# Patient Record
Sex: Female | Born: 1955
Health system: Southern US, Community
[De-identification: ages and names within clinical notes are randomized; demographics above are authoritative.]

## PROBLEM LIST (undated history)

## (undated) DIAGNOSIS — M1990 Unspecified osteoarthritis, unspecified site: Secondary | ICD-10-CM

## (undated) DIAGNOSIS — I82409 Acute embolism and thrombosis of unspecified deep veins of unspecified lower extremity: Secondary | ICD-10-CM

## (undated) DIAGNOSIS — C801 Malignant (primary) neoplasm, unspecified: Secondary | ICD-10-CM

## (undated) DIAGNOSIS — J45909 Unspecified asthma, uncomplicated: Secondary | ICD-10-CM

## (undated) DIAGNOSIS — R0600 Dyspnea, unspecified: Secondary | ICD-10-CM

## (undated) DIAGNOSIS — I639 Cerebral infarction, unspecified: Secondary | ICD-10-CM

## (undated) DIAGNOSIS — K219 Gastro-esophageal reflux disease without esophagitis: Secondary | ICD-10-CM

## (undated) DIAGNOSIS — F419 Anxiety disorder, unspecified: Secondary | ICD-10-CM

## (undated) DIAGNOSIS — G709 Myoneural disorder, unspecified: Secondary | ICD-10-CM

## (undated) HISTORY — PX: THYROID CYST EXCISION: SHX2511

---

## 2017-03-06 ENCOUNTER — Encounter (HOSPITAL_COMMUNITY): Payer: Self-pay

## 2017-03-06 ENCOUNTER — Ambulatory Visit (HOSPITAL_COMMUNITY)
Admission: RE | Admit: 2017-03-06 | Discharge: 2017-03-06 | Disposition: A | Discharge status: Home / Self Care | Payer: Federal, State, Local not specified - PPO | Source: Ambulatory Visit | Attending: Cardiology | Admitting: Cardiology

## 2017-03-06 ENCOUNTER — Other Ambulatory Visit (HOSPITAL_COMMUNITY): Payer: Self-pay | Admitting: Orthopedic Surgery

## 2017-03-06 DIAGNOSIS — M160 Bilateral primary osteoarthritis of hip: Secondary | ICD-10-CM | POA: Diagnosis present

## 2017-03-06 DIAGNOSIS — R52 Pain, unspecified: Secondary | ICD-10-CM | POA: Diagnosis not present

## 2017-03-06 DIAGNOSIS — I82412 Acute embolism and thrombosis of left femoral vein: Secondary | ICD-10-CM | POA: Insufficient documentation

## 2017-03-06 DIAGNOSIS — I82442 Acute embolism and thrombosis of left tibial vein: Secondary | ICD-10-CM

## 2017-03-06 DIAGNOSIS — I82409 Acute embolism and thrombosis of unspecified deep veins of unspecified lower extremity: Secondary | ICD-10-CM | POA: Diagnosis not present

## 2017-03-16 NOTE — H&P (Signed)
TOTAL HIP ADMISSION H&P  Patient is admitted for left total hip arthroplasty, anterior approach.  Subjective:  Chief Complaint:    Left hip AVN / pain  HPI: [name], 61 y.o. female, has a history of pain and functional disability in the left hip(s) due to arthritis and patient has failed non-surgical conservative treatments for greater than 12 weeks to include NSAID's and/or analgesics, use of assistive devices and activity modification.  Onset of symptoms was gradual starting 1+ years ago with gradually worsening course since that time.The patient noted no past surgery on the left hip(s).  Patient currently rates pain in the left hip at 10 out of 10 with activity. Patient has worsening of pain with activity and weight bearing, trendelenberg gait, pain that interfers with activities of daily living and pain with passive range of motion. Patient has evidence of periarticular osteophytes, joint space narrowing and AVN by imaging studies. This condition presents safety issues increasing the risk of falls.   There is no current active infection.   Risks, benefits and expectations were discussed with the patient.  Risks including but not limited to the risk of anesthesia, blood clots, nerve damage, blood vessel damage, failure of the prosthesis, infection and up to and including death.  Patient understand the risks, benefits and expectations and wishes to proceed with surgery.   PCP: Ladona Horns, MD  D/C Plans:       Home  Post-op Meds:       No Rx given  Tranexamic Acid:      To be given -  Topically (previous DVT)  Decadron:      Is to be given  FYI:     Xarelto  Norco  PT:  No PT   Past Medical History:  Diagnosis Date  . Anxiety   . Arthritis   . Asthma    childhood   . Cancer [facility])    colorectal cancer with chemo and radiation   . DVT (deep venous thrombosis) ([place])   . Dyspnea    with anxiety and exertion  . Neuromuscular disorder (HCC)    poly neuropathy   .  Stroke [facility]) 2010, 2011   TIA  x2    Past Surgical History:  Procedure Laterality Date  . CESAREAN SECTION    . THYROID CYST EXCISION      No prescriptions prior to admission.   Allergies  Allergen Reactions  . Penicillins     Has patient had a PCN reaction causing immediate rash, facial/tongue/throat swelling, SOB or lightheadedness with hypotension: Unknown Has patient had a PCN reaction causing severe rash involving mucus membranes or skin necrosis: Unknown Has patient had a PCN reaction that required hospitalization Unknown Has patient had a PCN reaction occurring within the last 10 years: Unknown Daughter states pt cant take any derivative of penicillin either    Social History  Substance Use Topics  . Smoking status: Former Smoker    Types: Cigarettes  . Smokeless tobacco: Never Used     Comment: 20 years ago   . Alcohol use No       Review of Systems  Constitutional: Negative.   HENT: Negative.   Eyes: Negative.   Respiratory: Negative.   Cardiovascular: Negative.   Gastrointestinal: Positive for constipation.  Genitourinary: Negative.   Musculoskeletal: Positive for joint pain.  Skin: Negative.   Neurological: Negative.   Endo/Heme/Allergies: Negative.   Psychiatric/Behavioral: The patient is nervous/anxious.     Objective:  Physical Exam  Constitutional: She  is oriented to person, place, and time. She appears well-developed.  HENT:  Head: Normocephalic.  Eyes: Pupils are equal, round, and reactive to light.  Neck: Neck supple. No JVD present. No tracheal deviation present. No thyromegaly present.  Cardiovascular: Normal rate, regular rhythm and intact distal pulses.   Respiratory: Effort normal and breath sounds normal. No respiratory distress. She has no wheezes.  GI: Soft. There is no tenderness. There is no guarding.  Musculoskeletal:       Left hip: She exhibits decreased range of motion, decreased strength, tenderness and bony tenderness. She  exhibits no swelling, no deformity and no laceration.  Lymphadenopathy:    She has no cervical adenopathy.  Neurological: She is alert and oriented to person, place, and time. A sensory deficit (polyneuropathy bilateral LEs) is present.  Skin: Skin is warm and dry.  Psychiatric: She has a normal mood and affect.       Imaging Review Plain radiographs demonstrate severe degenerative joint disease of the left hip(s). The bone quality appears to be good for age and reported activity level.  Assessment/Plan:  End stage arthritis, left hip(s)  The patient history, physical examination, clinical judgement of the provider and imaging studies are consistent with end stage degenerative joint disease of the left hip(s) and total hip arthroplasty is deemed medically necessary. The treatment options including medical management, injection therapy, arthroscopy and arthroplasty were discussed at length. The risks and benefits of total hip arthroplasty were presented and reviewed. The risks due to aseptic loosening, infection, stiffness, dislocation/subluxation,  thromboembolic complications and other imponderables were discussed.  The patient acknowledged the explanation, agreed to proceed with the plan and consent was signed. Patient is being admitted for inpatient treatment for surgery, pain control, PT, OT, prophylactic antibiotics, VTE prophylaxis, progressive ambulation and ADL's and discharge planning.The patient is planning to be discharged home.     [place] Analeah Brame   PA-C  03/21/2017, 3:48 AM

## 2017-03-17 ENCOUNTER — Other Ambulatory Visit (HOSPITAL_COMMUNITY): Payer: Self-pay | Admitting: Emergency Medicine

## 2017-03-17 NOTE — Patient Instructions (Addendum)
Hennie Smith-Thomas  03/17/2017   Your procedure is scheduled on: 03-25-17  Report to [place]  Entrance take [facility]  elevators to 3rd floor to  [place] at [phone].  Call this number if you have problems the morning of surgery [phone]   Remember: ONLY 1 PERSON MAY GO WITH YOU TO SHORT STAY TO GET  READY MORNING OF [place].  Do not eat food or drink liquids :After Midnight.     Take these medicines the morning of surgery with A SIP OF WATER: tramadol as needed                                 You may not have any metal on your body including hair pins and              piercings  Do not wear jewelry, make-up, lotions, powders or perfumes, deodorant             Do not wear nail polish.  Do not shave  48 hours prior to surgery.              Men may shave face and neck.   Do not bring valuables to the hospital. [place].  Contacts, dentures or bridgework may not be worn into surgery.  Leave suitcase in the car. After surgery it may be brought to your room.              Please read over the following fact sheets you were given: _____________________________________________________________________             [facility] - Preparing for Surgery Before surgery, you can play an important role.  Because skin is not sterile, your skin needs to be as free of germs as possible.  You can reduce the number of germs on your skin by washing with CHG (chlorahexidine gluconate) soap before surgery.  CHG is an antiseptic cleaner which kills germs and bonds with the skin to continue killing germs even after washing. Please DO NOT use if you have an allergy to CHG or antibacterial soaps.  If your skin becomes reddened/irritated stop using the CHG and inform your nurse when you arrive at Short Stay. Do not shave (including legs and underarms) for at least 48 hours prior to the first CHG shower.  You may shave  your face/neck. Please follow these instructions carefully:  1.  Shower with CHG Soap the night before surgery and the  morning of Surgery.  2.  If you choose to wash your hair, wash your hair first as usual with your  normal  shampoo.  3.  After you shampoo, rinse your hair and body thoroughly to remove the  shampoo.                           4.  Use CHG as you would any other liquid soap.  You can apply chg directly  to the skin and wash                       Gently with a scrungie or clean washcloth.  5.  Apply the CHG Soap to your body  ONLY FROM THE NECK DOWN.   Do not use on face/ open                           Wound or open sores. Avoid contact with eyes, ears mouth and genitals (private parts).                       Wash face,  Genitals (private parts) with your normal soap.             6.  Wash thoroughly, paying special attention to the area where your surgery  will be performed.  7.  Thoroughly rinse your body with warm water from the neck down.  8.  DO NOT shower/wash with your normal soap after using and rinsing off  the CHG Soap.                9.  Pat yourself dry with a clean towel.            10.  Wear clean pajamas.            11.  Place clean sheets on your bed the night of your first shower and do not  sleep with pets. Day of Surgery : Do not apply any lotions/deodorants the morning of surgery.  Please wear clean clothes to the hospital/surgery center.  FAILURE TO FOLLOW THESE INSTRUCTIONS MAY RESULT IN THE CANCELLATION OF YOUR SURGERY PATIENT SIGNATURE_________________________________  NURSE SIGNATURE__________________________________  ________________________________________________________________________   Adam Phenix  An incentive spirometer is a tool that can help keep your lungs clear and active. This tool measures how well you are filling your lungs with each breath. Taking long deep breaths may help reverse or decrease the chance of developing  breathing (pulmonary) problems (especially infection) following:  A long period of time when you are unable to move or be active. BEFORE THE PROCEDURE   If the spirometer includes an indicator to show your best effort, your nurse or respiratory therapist will set it to a desired goal.  If possible, sit up straight or lean slightly forward. Try not to slouch.  Hold the incentive spirometer in an upright position. INSTRUCTIONS FOR USE  1. Sit on the edge of your bed if possible, or sit up as far as you can in bed or on a chair. 2. Hold the incentive spirometer in an upright position. 3. Breathe out normally. 4. Place the mouthpiece in your mouth and seal your lips tightly around it. 5. Breathe in slowly and as deeply as possible, raising the piston or the ball toward the top of the column. 6. Hold your breath for 3-5 seconds or for as long as possible. Allow the piston or ball to fall to the bottom of the column. 7. Remove the mouthpiece from your mouth and breathe out normally. 8. Rest for a few seconds and repeat Steps 1 through 7 at least 10 times every 1-2 hours when you are awake. Take your time and take a few normal breaths between deep breaths. 9. The spirometer may include an indicator to show your best effort. Use the indicator as a goal to work toward during each repetition. 10. After each set of 10 deep breaths, practice coughing to be sure your lungs are clear. If you have an incision (the cut made at the time of surgery), support your incision when coughing by placing a pillow or rolled up towels firmly against it. Once  you are able to get out of bed, walk around indoors and cough well. You may stop using the incentive spirometer when instructed by your caregiver.  RISKS AND COMPLICATIONS  Take your time so you do not get dizzy or light-headed.  If you are in pain, you may need to take or ask for pain medication before doing incentive spirometry. It is harder to take a deep  breath if you are having pain. AFTER USE  Rest and breathe slowly and easily.  It can be helpful to keep track of a log of your progress. Your caregiver can provide you with a simple table to help with this. If you are using the spirometer at home, follow these instructions: [place] IF:   You are having difficultly using the spirometer.  You have trouble using the spirometer as often as instructed.  Your pain medication is not giving enough relief while using the spirometer.  You develop fever of 100.5 F (38.1 C) or higher. SEEK IMMEDIATE MEDICAL CARE IF:   You cough up bloody sputum that had not been present before.  You develop fever of 102 F (38.9 C) or greater.  You develop worsening pain at or near the incision site. MAKE SURE YOU:   Understand these instructions.  Will watch your condition.  Will get help right away if you are not doing well or get worse. Document Released: 04/21/2007 Document Revised: 03/02/2012 Document Reviewed: 06/22/2007 Exit[employer] Patient Information 2014 Exit[employer], [place].   ________________________________________________________________________  WHAT IS A BLOOD TRANSFUSION? Blood Transfusion Information  A transfusion is the replacement of blood or some of its parts. Blood is made up of multiple cells which provide different functions.  Red blood cells carry oxygen and are used for blood loss replacement.  White blood cells fight against infection.  Platelets control bleeding.  Plasma helps clot blood.  Other blood products are available for specialized needs, such as hemophilia or other clotting disorders. BEFORE THE TRANSFUSION  Who gives blood for transfusions?   Healthy volunteers who are fully evaluated to make sure their blood is safe. This is blood bank blood. Transfusion therapy is the safest it has ever been in the practice of medicine. Before blood is taken from a donor, a complete history is taken to make sure  that person has no history of diseases nor engages in risky social behavior (examples are intravenous drug use or sexual activity with multiple partners). The donor's travel history is screened to minimize risk of transmitting infections, such as malaria. The donated blood is tested for signs of infectious diseases, such as HIV and hepatitis. The blood is then tested to be sure it is compatible with you in order to minimize the chance of a transfusion reaction. If you or a relative donates blood, this is often done in anticipation of surgery and is not appropriate for emergency situations. It takes many days to process the donated blood. RISKS AND COMPLICATIONS Although transfusion therapy is very safe and saves many lives, the main dangers of transfusion include:   Getting an infectious disease.  Developing a transfusion reaction. This is an allergic reaction to something in the blood you were given. Every precaution is taken to prevent this. The decision to have a blood transfusion has been considered carefully by your caregiver before blood is given. Blood is not given unless the benefits outweigh the risks. AFTER THE TRANSFUSION  Right after receiving a blood transfusion, you will usually feel much better and more energetic. This  is especially true if your red blood cells have gotten low (anemic). The transfusion raises the level of the red blood cells which carry oxygen, and this usually causes an energy increase.  The nurse administering the transfusion will monitor you carefully for complications. HOME CARE INSTRUCTIONS  No special instructions are needed after a transfusion. You may find your energy is better. Speak with your caregiver about any limitations on activity for underlying diseases you may have. SEEK MEDICAL CARE IF:   Your condition is not improving after your transfusion.  You develop redness or irritation at the intravenous (IV) site. SEEK IMMEDIATE MEDICAL CARE IF:  Any of  the following symptoms occur over the next 12 hours:  Shaking chills.  You have a temperature by mouth above 102 F (38.9 C), not controlled by medicine.  Chest, back, or muscle pain.  People around you feel you are not acting correctly or are confused.  Shortness of breath or difficulty breathing.  Dizziness and fainting.  You get a rash or develop hives.  You have a decrease in urine output.  Your urine turns a dark color or changes to pink, red, or brown. Any of the following symptoms occur over the next 10 days:  You have a temperature by mouth above 102 F (38.9 C), not controlled by medicine.  Shortness of breath.  Weakness after normal activity.  The white part of the eye turns yellow (jaundice).  You have a decrease in the amount of urine or are urinating less often.  Your urine turns a dark color or changes to pink, red, or brown. Document Released: 12/06/2000 Document Revised: 03/02/2012 Document Reviewed: 07/25/2008 [facility] Patient Information 2014 [place], [place].  _______________________________________________________________________

## 2017-03-18 ENCOUNTER — Encounter (HOSPITAL_COMMUNITY)
Admission: RE | Admit: 2017-03-18 | Discharge: 2017-03-18 | Disposition: A | Discharge status: Home / Self Care | Payer: Federal, State, Local not specified - PPO | Source: Ambulatory Visit | Attending: Orthopedic Surgery | Admitting: Orthopedic Surgery

## 2017-03-18 ENCOUNTER — Encounter (HOSPITAL_COMMUNITY): Payer: Self-pay | Admitting: Emergency Medicine

## 2017-03-18 DIAGNOSIS — M25552 Pain in left hip: Secondary | ICD-10-CM | POA: Diagnosis not present

## 2017-03-18 DIAGNOSIS — Z01812 Encounter for preprocedural laboratory examination: Secondary | ICD-10-CM | POA: Insufficient documentation

## 2017-03-18 HISTORY — DX: Myoneural disorder, unspecified: G70.9

## 2017-03-18 HISTORY — DX: Unspecified asthma, uncomplicated: J45.909

## 2017-03-18 HISTORY — DX: Dyspnea, unspecified: R06.00

## 2017-03-18 HISTORY — DX: Malignant (primary) neoplasm, unspecified: C80.1

## 2017-03-18 HISTORY — DX: Unspecified osteoarthritis, unspecified site: M19.90

## 2017-03-18 HISTORY — DX: Anxiety disorder, unspecified: F41.9

## 2017-03-18 HISTORY — DX: Cerebral infarction, unspecified: I63.9

## 2017-03-18 HISTORY — DX: Acute embolism and thrombosis of unspecified deep veins of unspecified lower extremity: I82.409

## 2017-03-18 LAB — SURGICAL PCR SCREEN
MRSA, PCR: NEGATIVE
STAPHYLOCOCCUS AUREUS: NEGATIVE

## 2017-03-18 LAB — ABO/RH: ABO/RH(D): O POS

## 2017-03-18 LAB — CBC
HEMATOCRIT: 37.2 % (ref 36.0–46.0)
Hemoglobin: 12.3 g/dL (ref 12.0–15.0)
MCH: 25.6 pg — AB (ref 26.0–34.0)
MCHC: 33.1 g/dL (ref 30.0–36.0)
MCV: 77.3 fL — AB (ref 78.0–100.0)
PLATELETS: 476 10*3/uL — AB (ref 150–400)
RBC: 4.81 MIL/uL (ref 3.87–5.11)
RDW: 16.6 % — AB (ref 11.5–15.5)
WBC: 9.6 10*3/uL (ref 4.0–10.5)

## 2017-03-18 LAB — BASIC METABOLIC PANEL
Anion gap: 4 — ABNORMAL LOW (ref 5–15)
BUN: 8 mg/dL (ref 6–20)
CO2: 29 mmol/L (ref 22–32)
CREATININE: 0.55 mg/dL (ref 0.44–1.00)
Calcium: 9.3 mg/dL (ref 8.9–10.3)
Chloride: 106 mmol/L (ref 101–111)
GFR calc Af Amer: >60 mL/min (ref >60)
GFR calc non Af Amer: >60 mL/min (ref >60)
Glucose, Bld: 90 mg/dL (ref 65–99)
POTASSIUM: 4 mmol/L (ref 3.5–5.1)
Sodium: 139 mmol/L (ref 135–145)

## 2017-03-19 NOTE — Progress Notes (Signed)
requested EKG from Dr Inda Castle at [facility]

## 2017-03-20 NOTE — Progress Notes (Signed)
EKG 03-11-17 on chart

## 2017-03-24 NOTE — Anesthesia Preprocedure Evaluation (Addendum)
Anesthesia Evaluation  Patient identified by MRN, date of birth, ID band Patient awake    Reviewed: Allergy & Precautions, NPO status , Patient's Chart, lab work & pertinent test results  History of Anesthesia Complications Negative for: history of anesthetic complications  Airway Mallampati: III  TM Distance: >3 FB Neck ROM: Full    Dental  (+) Dental Advisory Given   Pulmonary asthma , former smoker,    Pulmonary exam normal        Cardiovascular negative cardio ROS Normal cardiovascular exam     Neuro/Psych Anxiety CVA    GI/Hepatic negative GI ROS, Neg liver ROS,   Endo/Other  negative endocrine ROS  Renal/GU negative Renal ROS     Musculoskeletal negative musculoskeletal ROS (+)   Abdominal   Peds  Hematology negative hematology ROS (+)   Anesthesia Other Findings Day of surgery medications reviewed with the patient.  Reproductive/Obstetrics                            Anesthesia Physical Anesthesia Plan  ASA: III  Anesthesia Plan: General   Post-op Pain Management:    Induction: Intravenous  Airway Management Planned: Oral ETT  Additional Equipment:   Intra-op Plan:   Post-operative Plan: Extubation in OR  Informed Consent: I have reviewed the patients History and Physical, chart, labs and discussed the procedure including the risks, benefits and alternatives for the proposed anesthesia with the patient or authorized representative who has indicated his/her understanding and acceptance.   Dental advisory given  Plan Discussed with: CRNA, Anesthesiologist and Surgeon  Anesthesia Plan Comments: (Pt refuses SAB)       Anesthesia Quick Evaluation

## 2017-03-25 ENCOUNTER — Encounter (HOSPITAL_COMMUNITY)
Admission: RE | Disposition: A | Discharge status: Home Health | Payer: Self-pay | Source: Ambulatory Visit | Attending: Orthopedic Surgery

## 2017-03-25 ENCOUNTER — Inpatient Hospital Stay (HOSPITAL_COMMUNITY): Payer: Federal, State, Local not specified - PPO | Admitting: Anesthesiology

## 2017-03-25 ENCOUNTER — Encounter (HOSPITAL_COMMUNITY): Payer: Self-pay | Admitting: *Deleted

## 2017-03-25 ENCOUNTER — Inpatient Hospital Stay (HOSPITAL_COMMUNITY): Payer: Federal, State, Local not specified - PPO

## 2017-03-25 ENCOUNTER — Inpatient Hospital Stay (HOSPITAL_COMMUNITY)
Admission: RE | Admit: 2017-03-25 | Discharge: 2017-03-28 | DRG: 470 | Disposition: A | Discharge status: Home Health | Payer: Federal, State, Local not specified - PPO | Source: Ambulatory Visit | Attending: Orthopedic Surgery | Admitting: Orthopedic Surgery

## 2017-03-25 DIAGNOSIS — Z87891 Personal history of nicotine dependence: Secondary | ICD-10-CM | POA: Diagnosis not present

## 2017-03-25 DIAGNOSIS — M25552 Pain in left hip: Secondary | ICD-10-CM

## 2017-03-25 DIAGNOSIS — M879 Osteonecrosis, unspecified: Secondary | ICD-10-CM | POA: Diagnosis present

## 2017-03-25 DIAGNOSIS — G629 Polyneuropathy, unspecified: Secondary | ICD-10-CM | POA: Diagnosis present

## 2017-03-25 DIAGNOSIS — K5900 Constipation, unspecified: Secondary | ICD-10-CM | POA: Diagnosis not present

## 2017-03-25 DIAGNOSIS — Z86718 Personal history of other venous thrombosis and embolism: Secondary | ICD-10-CM | POA: Diagnosis not present

## 2017-03-25 DIAGNOSIS — F419 Anxiety disorder, unspecified: Secondary | ICD-10-CM | POA: Diagnosis present

## 2017-03-25 DIAGNOSIS — Z880 Allergy status to penicillin: Secondary | ICD-10-CM | POA: Diagnosis not present

## 2017-03-25 DIAGNOSIS — Z85048 Personal history of other malignant neoplasm of rectum, rectosigmoid junction, and anus: Secondary | ICD-10-CM

## 2017-03-25 DIAGNOSIS — J45909 Unspecified asthma, uncomplicated: Secondary | ICD-10-CM | POA: Diagnosis present

## 2017-03-25 DIAGNOSIS — M1612 Unilateral primary osteoarthritis, left hip: Principal | ICD-10-CM | POA: Diagnosis present

## 2017-03-25 DIAGNOSIS — Z8673 Personal history of transient ischemic attack (TIA), and cerebral infarction without residual deficits: Secondary | ICD-10-CM

## 2017-03-25 DIAGNOSIS — Z96649 Presence of unspecified artificial hip joint: Secondary | ICD-10-CM

## 2017-03-25 DIAGNOSIS — Z96642 Presence of left artificial hip joint: Secondary | ICD-10-CM

## 2017-03-25 HISTORY — PX: TOTAL HIP ARTHROPLASTY: SHX124

## 2017-03-25 SURGERY — ARTHROPLASTY, HIP, TOTAL, ANTERIOR APPROACH
Anesthesia: General | Site: Hip | Laterality: Left

## 2017-03-25 MED ORDER — SODIUM CHLORIDE 0.9 % IJ SOLN
INTRAMUSCULAR | Status: AC
  Filled 2017-03-25: qty 10

## 2017-03-25 MED ORDER — LIDOCAINE 2% (20 MG/ML) 5 ML SYRINGE
INTRAMUSCULAR | Status: DC | PRN
  Administered 2017-03-25: 100 mg via INTRAVENOUS

## 2017-03-25 MED ORDER — HYDROMORPHONE HCL 1 MG/ML IJ SOLN
INTRAMUSCULAR | Status: DC | PRN
  Administered 2017-03-25 (×4): .4 mg via INTRAVENOUS

## 2017-03-25 MED ORDER — ONDANSETRON HCL 4 MG/2ML IJ SOLN
INTRAMUSCULAR | Status: DC | PRN
  Administered 2017-03-25: 4 mg via INTRAVENOUS

## 2017-03-25 MED ORDER — METHOCARBAMOL 1000 MG/10ML IJ SOLN
500.0000 mg | Freq: Four times a day (QID) | INTRAMUSCULAR | Status: DC | PRN
  Administered 2017-03-25: 500 mg via INTRAVENOUS
  Filled 2017-03-25: qty 5
  Filled 2017-03-25: qty 550

## 2017-03-25 MED ORDER — FENTANYL CITRATE (PF) 100 MCG/2ML IJ SOLN
INTRAMUSCULAR | Status: AC
  Filled 2017-03-25: qty 2

## 2017-03-25 MED ORDER — METHOCARBAMOL 500 MG PO TABS: 500.0000 mg | ORAL_TABLET | Freq: Four times a day (QID) | ORAL | 0 refills | Status: AC | PRN

## 2017-03-25 MED ORDER — LABETALOL HCL 5 MG/ML IV SOLN
INTRAVENOUS | Status: AC
  Filled 2017-03-25: qty 4

## 2017-03-25 MED ORDER — POLYETHYLENE GLYCOL 3350 17 G PO PACK: 17.0000 g | PACK | Freq: Two times a day (BID) | ORAL | 0 refills | Status: AC

## 2017-03-25 MED ORDER — MIDAZOLAM HCL 2 MG/2ML IJ SOLN: 0.5000 mg | Freq: Once | INTRAMUSCULAR | Status: DC

## 2017-03-25 MED ORDER — CELECOXIB 200 MG PO CAPS
200.0000 mg | ORAL_CAPSULE | Freq: Two times a day (BID) | ORAL | Status: DC
  Administered 2017-03-25 – 2017-03-28 (×6): 200 mg via ORAL
  Filled 2017-03-25 (×6): qty 1

## 2017-03-25 MED ORDER — ONDANSETRON HCL 4 MG PO TABS: 4.0000 mg | ORAL_TABLET | Freq: Four times a day (QID) | ORAL | Status: DC | PRN

## 2017-03-25 MED ORDER — ONDANSETRON HCL 4 MG/2ML IJ SOLN
INTRAMUSCULAR | Status: AC
Start: 2017-03-25 — End: 2017-03-25
  Filled 2017-03-25: qty 2

## 2017-03-25 MED ORDER — HYDROMORPHONE HCL 1 MG/ML IJ SOLN
0.2500 mg | INTRAMUSCULAR | Status: DC | PRN
  Administered 2017-03-25: 0.5 mg via INTRAVENOUS

## 2017-03-25 MED ORDER — TRANEXAMIC ACID 1000 MG/10ML IV SOLN
INTRAVENOUS | Status: DC | PRN
  Administered 2017-03-25: 2000 mg via TOPICAL

## 2017-03-25 MED ORDER — SODIUM CHLORIDE 0.9 % IV SOLN
100.0000 mL/h | INTRAVENOUS | Status: DC
  Administered 2017-03-25 – 2017-03-26 (×2): 100 mL/h via INTRAVENOUS
  Filled 2017-03-25 (×10): qty 1000

## 2017-03-25 MED ORDER — KETAMINE HCL 10 MG/ML IJ SOLN
INTRAMUSCULAR | Status: DC | PRN
  Administered 2017-03-25: 10 mg via INTRAVENOUS
  Administered 2017-03-25: 20 mg via INTRAVENOUS
  Administered 2017-03-25 (×2): 10 mg via INTRAVENOUS

## 2017-03-25 MED ORDER — ACETAMINOPHEN 10 MG/ML IV SOLN: INTRAVENOUS | Status: DC | PRN

## 2017-03-25 MED ORDER — METOCLOPRAMIDE HCL 5 MG/ML IJ SOLN
5.0000 mg | Freq: Three times a day (TID) | INTRAMUSCULAR | Status: DC | PRN
  Filled 2017-03-25: qty 2

## 2017-03-25 MED ORDER — NON FORMULARY: 20.0000 mg | Freq: Every day | Status: DC

## 2017-03-25 MED ORDER — BISACODYL 10 MG RE SUPP: 10.0000 mg | Freq: Every day | RECTAL | Status: DC | PRN

## 2017-03-25 MED ORDER — 0.9 % SODIUM CHLORIDE (POUR BTL) OPTIME
TOPICAL | Status: DC | PRN
  Administered 2017-03-25: 1000 mL

## 2017-03-25 MED ORDER — PHENOL 1.4 % MT LIQD
1.0000 | OROMUCOSAL | Status: DC | PRN
  Administered 2017-03-26: 02:00:00 1 via OROMUCOSAL
  Filled 2017-03-25: qty 177

## 2017-03-25 MED ORDER — SUGAMMADEX SODIUM 200 MG/2ML IV SOLN
INTRAVENOUS | Status: DC | PRN
  Administered 2017-03-25: 160 mg via INTRAVENOUS

## 2017-03-25 MED ORDER — PROPOFOL 10 MG/ML IV BOLUS
INTRAVENOUS | Status: AC
  Filled 2017-03-25: qty 20

## 2017-03-25 MED ORDER — HYDROCODONE-ACETAMINOPHEN 7.5-325 MG PO TABS: 1.0000 | ORAL_TABLET | ORAL | 0 refills | Status: DC | PRN

## 2017-03-25 MED ORDER — CEFAZOLIN SODIUM-DEXTROSE 2-4 GM/100ML-% IV SOLN
INTRAVENOUS | Status: AC
  Filled 2017-03-25: qty 100

## 2017-03-25 MED ORDER — MIDAZOLAM HCL 2 MG/2ML IJ SOLN
INTRAMUSCULAR | Status: AC
  Filled 2017-03-25: qty 2

## 2017-03-25 MED ORDER — DEXAMETHASONE SODIUM PHOSPHATE 10 MG/ML IJ SOLN
10.0000 mg | Freq: Once | INTRAMUSCULAR | Status: AC
  Administered 2017-03-25: 10 mg via INTRAVENOUS

## 2017-03-25 MED ORDER — FENTANYL CITRATE (PF) 100 MCG/2ML IJ SOLN
INTRAMUSCULAR | Status: DC | PRN
  Administered 2017-03-25 (×2): 50 ug via INTRAVENOUS
  Administered 2017-03-25: 100 ug via INTRAVENOUS

## 2017-03-25 MED ORDER — ACETAMINOPHEN 10 MG/ML IV SOLN
INTRAVENOUS | Status: DC | PRN
  Administered 2017-03-25: 1000 mg via INTRAVENOUS

## 2017-03-25 MED ORDER — DOCUSATE SODIUM 100 MG PO CAPS
100.0000 mg | ORAL_CAPSULE | Freq: Two times a day (BID) | ORAL | Status: DC
  Administered 2017-03-25 – 2017-03-28 (×7): 100 mg via ORAL
  Filled 2017-03-25 (×7): qty 1

## 2017-03-25 MED ORDER — HYDROCODONE-ACETAMINOPHEN 7.5-325 MG PO TABS
1.0000 | ORAL_TABLET | ORAL | Status: DC
  Administered 2017-03-25: 1 via ORAL
  Administered 2017-03-25 (×2): 2 via ORAL
  Administered 2017-03-25: 13:00:00 1 via ORAL
  Administered 2017-03-26 (×4): 2 via ORAL
  Administered 2017-03-26: 05:00:00 1 via ORAL
  Administered 2017-03-26: 2 via ORAL
  Administered 2017-03-27 (×2): 1 via ORAL
  Administered 2017-03-27: 09:00:00 2 via ORAL
  Administered 2017-03-27 – 2017-03-28 (×3): 1 via ORAL
  Filled 2017-03-25: qty 1
  Filled 2017-03-25 (×2): qty 2
  Filled 2017-03-25 (×3): qty 1
  Filled 2017-03-25: qty 2
  Filled 2017-03-25 (×2): qty 1
  Filled 2017-03-25 (×3): qty 2
  Filled 2017-03-25: qty 1
  Filled 2017-03-25 (×4): qty 2

## 2017-03-25 MED ORDER — CEFAZOLIN SODIUM-DEXTROSE 2-4 GM/100ML-% IV SOLN
2.0000 g | INTRAVENOUS | Status: AC
  Administered 2017-03-25: 2 g via INTRAVENOUS

## 2017-03-25 MED ORDER — ROCURONIUM BROMIDE 10 MG/ML (PF) SYRINGE
PREFILLED_SYRINGE | INTRAVENOUS | Status: DC | PRN
  Administered 2017-03-25: 50 mg via INTRAVENOUS
  Administered 2017-03-25: 10 mg via INTRAVENOUS
  Administered 2017-03-25: 5 mg via INTRAVENOUS

## 2017-03-25 MED ORDER — ONDANSETRON HCL 4 MG/2ML IJ SOLN
4.0000 mg | Freq: Four times a day (QID) | INTRAMUSCULAR | Status: DC | PRN
  Administered 2017-03-25: 4 mg via INTRAVENOUS
  Filled 2017-03-25: qty 2

## 2017-03-25 MED ORDER — MIDAZOLAM HCL 2 MG/2ML IJ SOLN
INTRAMUSCULAR | Status: AC
Start: 2017-03-25 — End: 2017-03-25
  Filled 2017-03-25: qty 2

## 2017-03-25 MED ORDER — FENTANYL CITRATE (PF) 100 MCG/2ML IJ SOLN
INTRAMUSCULAR | Status: AC
Start: 2017-03-25 — End: 2017-03-25
  Filled 2017-03-25: qty 2

## 2017-03-25 MED ORDER — CHLORHEXIDINE GLUCONATE 4 % EX LIQD: 60.0000 mL | Freq: Once | CUTANEOUS | Status: DC

## 2017-03-25 MED ORDER — OMEPRAZOLE 20 MG PO CPDR
20.0000 mg | DELAYED_RELEASE_CAPSULE | Freq: Every day | ORAL | Status: DC
  Administered 2017-03-25 – 2017-03-27 (×3): 20 mg via ORAL
  Filled 2017-03-25 (×3): qty 1

## 2017-03-25 MED ORDER — AMITRIPTYLINE HCL 50 MG PO TABS: 50.0000 mg | ORAL_TABLET | Freq: Every day | ORAL | Status: DC

## 2017-03-25 MED ORDER — STERILE WATER FOR IRRIGATION IR SOLN
Status: DC | PRN
  Administered 2017-03-25: 2000 mL

## 2017-03-25 MED ORDER — FERROUS SULFATE 325 (65 FE) MG PO TABS: 325.0000 mg | ORAL_TABLET | Freq: Three times a day (TID) | ORAL | Status: DC

## 2017-03-25 MED ORDER — MAGNESIUM CITRATE PO SOLN: 1.0000 | Freq: Once | ORAL | Status: DC | PRN

## 2017-03-25 MED ORDER — SUGAMMADEX SODIUM 200 MG/2ML IV SOLN
INTRAVENOUS | Status: AC
  Filled 2017-03-25: qty 2

## 2017-03-25 MED ORDER — KETAMINE HCL 10 MG/ML IJ SOLN
INTRAMUSCULAR | Status: AC
  Filled 2017-03-25: qty 1

## 2017-03-25 MED ORDER — RIVAROXABAN 10 MG PO TABS: 10.0000 mg | ORAL_TABLET | ORAL | Status: DC

## 2017-03-25 MED ORDER — DOCUSATE SODIUM 100 MG PO CAPS: 100.0000 mg | ORAL_CAPSULE | Freq: Two times a day (BID) | ORAL | 0 refills | Status: AC

## 2017-03-25 MED ORDER — TRANEXAMIC ACID 1000 MG/10ML IV SOLN
2000.0000 mg | Freq: Once | INTRAVENOUS | Status: DC
  Filled 2017-03-25: qty 20

## 2017-03-25 MED ORDER — METHOCARBAMOL 500 MG PO TABS
500.0000 mg | ORAL_TABLET | Freq: Four times a day (QID) | ORAL | Status: DC | PRN
  Administered 2017-03-26 – 2017-03-28 (×5): 500 mg via ORAL
  Filled 2017-03-25 (×6): qty 1

## 2017-03-25 MED ORDER — PROPOFOL 10 MG/ML IV BOLUS
INTRAVENOUS | Status: DC | PRN
  Administered 2017-03-25: 180 mg via INTRAVENOUS

## 2017-03-25 MED ORDER — DIPHENHYDRAMINE HCL 25 MG PO CAPS
25.0000 mg | ORAL_CAPSULE | Freq: Four times a day (QID) | ORAL | Status: DC | PRN
  Administered 2017-03-26: 25 mg via ORAL
  Filled 2017-03-25: qty 1

## 2017-03-25 MED ORDER — HYDROMORPHONE HCL 2 MG/ML IJ SOLN
INTRAMUSCULAR | Status: AC
  Filled 2017-03-25: qty 1

## 2017-03-25 MED ORDER — DEXAMETHASONE SODIUM PHOSPHATE 10 MG/ML IJ SOLN
10.0000 mg | Freq: Once | INTRAMUSCULAR | Status: AC
  Administered 2017-03-26: 09:00:00 10 mg via INTRAVENOUS
  Filled 2017-03-25: qty 1

## 2017-03-25 MED ORDER — CEFAZOLIN IN D5W 1 GM/50ML IV SOLN
1.0000 g | Freq: Four times a day (QID) | INTRAVENOUS | Status: AC
  Administered 2017-03-25 (×2): 1 g via INTRAVENOUS
  Filled 2017-03-25 (×2): qty 50

## 2017-03-25 MED ORDER — POLYETHYLENE GLYCOL 3350 17 G PO PACK
17.0000 g | PACK | Freq: Two times a day (BID) | ORAL | Status: DC
  Administered 2017-03-25 – 2017-03-27 (×4): 17 g via ORAL
  Filled 2017-03-25 (×5): qty 1

## 2017-03-25 MED ORDER — MIDAZOLAM HCL 2 MG/2ML IJ SOLN
INTRAMUSCULAR | Status: DC | PRN
  Administered 2017-03-25 (×2): 1 mg via INTRAVENOUS

## 2017-03-25 MED ORDER — DEXAMETHASONE SODIUM PHOSPHATE 10 MG/ML IJ SOLN
INTRAMUSCULAR | Status: AC
  Filled 2017-03-25: qty 1

## 2017-03-25 MED ORDER — FERROUS SULFATE 325 (65 FE) MG PO TABS
325.0000 mg | ORAL_TABLET | Freq: Three times a day (TID) | ORAL | Status: DC
  Administered 2017-03-27 – 2017-03-28 (×3): 325 mg via ORAL
  Filled 2017-03-25 (×4): qty 1

## 2017-03-25 MED ORDER — HYDROMORPHONE HCL 1 MG/ML IJ SOLN
INTRAMUSCULAR | Status: AC
  Administered 2017-03-25: 0.5 mg via INTRAVENOUS
  Filled 2017-03-25: qty 1

## 2017-03-25 MED ORDER — METOCLOPRAMIDE HCL 5 MG PO TABS: 5.0000 mg | ORAL_TABLET | Freq: Three times a day (TID) | ORAL | Status: DC | PRN

## 2017-03-25 MED ORDER — LABETALOL HCL 5 MG/ML IV SOLN
INTRAVENOUS | Status: DC | PRN
Start: 2017-03-25 — End: 2017-03-25
  Administered 2017-03-25: 5 mg via INTRAVENOUS
  Administered 2017-03-25: 2.5 mg via INTRAVENOUS

## 2017-03-25 MED ORDER — LIDOCAINE 2% (20 MG/ML) 5 ML SYRINGE
INTRAMUSCULAR | Status: AC
  Filled 2017-03-25: qty 5

## 2017-03-25 MED ORDER — ACETAMINOPHEN 10 MG/ML IV SOLN
INTRAVENOUS | Status: AC
  Filled 2017-03-25: qty 100

## 2017-03-25 MED ORDER — HYDROMORPHONE HCL 1 MG/ML IJ SOLN
0.5000 mg | INTRAMUSCULAR | Status: DC | PRN
  Administered 2017-03-25: 14:00:00 0.5 mg via INTRAVENOUS
  Administered 2017-03-25: 22:00:00 1 mg via INTRAVENOUS
  Administered 2017-03-25: 18:00:00 0.5 mg via INTRAVENOUS
  Filled 2017-03-25: qty 0.5
  Filled 2017-03-25: qty 1
  Filled 2017-03-25: qty 0.5

## 2017-03-25 MED ORDER — SUCCINYLCHOLINE CHLORIDE 200 MG/10ML IV SOSY
PREFILLED_SYRINGE | INTRAVENOUS | Status: AC
  Filled 2017-03-25: qty 10

## 2017-03-25 MED ORDER — ROCURONIUM BROMIDE 50 MG/5ML IV SOSY
PREFILLED_SYRINGE | INTRAVENOUS | Status: AC
  Filled 2017-03-25: qty 5

## 2017-03-25 MED ORDER — ALUM & MAG HYDROXIDE-SIMETH 200-200-20 MG/5ML PO SUSP: 15.0000 mL | ORAL | Status: DC | PRN

## 2017-03-25 MED ORDER — LACTATED RINGERS IV SOLN
INTRAVENOUS | Status: DC | PRN
  Administered 2017-03-25 (×3): via INTRAVENOUS

## 2017-03-25 MED ORDER — MENTHOL 3 MG MT LOZG: 1.0000 | LOZENGE | OROMUCOSAL | Status: DC | PRN

## 2017-03-25 MED ORDER — PROMETHAZINE HCL 25 MG/ML IJ SOLN: 6.2500 mg | INTRAMUSCULAR | Status: DC | PRN

## 2017-03-25 SURGICAL SUPPLY — 38 items
BAG DECANTER FOR FLEXI CONT (MISCELLANEOUS) ×3 IMPLANT
BAG ZIPLOCK 12X15 (MISCELLANEOUS) IMPLANT
BLADE SAG 18X100X1.27 (BLADE) ×3 IMPLANT
CAPT HIP TOTAL 2 ×3 IMPLANT
CLOTH BEACON ORANGE TIMEOUT ST (SAFETY) ×3 IMPLANT
COVER PERINEAL POST (MISCELLANEOUS) ×3 IMPLANT
DERMABOND ADVANCED (GAUZE/BANDAGES/DRESSINGS) ×2
DERMABOND ADVANCED .7 DNX12 (GAUZE/BANDAGES/DRESSINGS) ×1 IMPLANT
DRAPE STERI IOBAN 125X83 (DRAPES) ×3 IMPLANT
DRAPE U-SHAPE 47X51 STRL (DRAPES) ×6 IMPLANT
DRESSING AQUACEL AG SP 3.5X10 (GAUZE/BANDAGES/DRESSINGS) ×1 IMPLANT
DRSG AQUACEL AG ADV 3.5X10 (GAUZE/BANDAGES/DRESSINGS) ×3 IMPLANT
DRSG AQUACEL AG SP 3.5X10 (GAUZE/BANDAGES/DRESSINGS) ×3
DURAPREP 26ML APPLICATOR (WOUND CARE) ×3 IMPLANT
ELECT REM PT RETURN 15FT ADLT (MISCELLANEOUS) ×3 IMPLANT
GLOVE BIOGEL M STRL SZ7.5 (GLOVE) IMPLANT
GLOVE BIOGEL PI IND STRL 7.5 (GLOVE) ×1 IMPLANT
GLOVE BIOGEL PI IND STRL 8.5 (GLOVE) ×1 IMPLANT
GLOVE BIOGEL PI INDICATOR 7.5 (GLOVE) ×2
GLOVE BIOGEL PI INDICATOR 8.5 (GLOVE) ×2
GLOVE ECLIPSE 8.0 STRL XLNG CF (GLOVE) ×6 IMPLANT
GLOVE ORTHO TXT STRL SZ7.5 (GLOVE) ×3 IMPLANT
GOWN STRL REUS W/TWL LRG LVL3 (GOWN DISPOSABLE) ×3 IMPLANT
GOWN STRL REUS W/TWL XL LVL3 (GOWN DISPOSABLE) ×3 IMPLANT
GRAFT IC CHAMBER 5CC (Bone Implant) ×3 IMPLANT
HOLDER FOLEY CATH W/STRAP (MISCELLANEOUS) ×3 IMPLANT
PACK ANTERIOR HIP CUSTOM (KITS) ×3 IMPLANT
SUT MNCRL AB 4-0 PS2 18 (SUTURE) ×3 IMPLANT
SUT STRATAFIX 0 PDS 27 VIOLET (SUTURE)
SUT VIC AB 1 CT1 36 (SUTURE) ×9 IMPLANT
SUT VIC AB 2-0 CT1 27 (SUTURE) ×4
SUT VIC AB 2-0 CT1 TAPERPNT 27 (SUTURE) ×2 IMPLANT
SUT VLOC 180 0 24IN GS25 (SUTURE) ×3 IMPLANT
SUTURE STRATFX 0 PDS 27 VIOLET (SUTURE) IMPLANT
TRAY FOLEY CATH 14FRSI W/METER (CATHETERS) ×3 IMPLANT
TRAY FOLEY W/METER SILVER 16FR (SET/KITS/TRAYS/PACK) IMPLANT
WATER STERILE IRR 1500ML POUR (IV SOLUTION) ×6 IMPLANT
YANKAUER SUCT BULB TIP 10FT TU (MISCELLANEOUS) ×3 IMPLANT

## 2017-03-25 NOTE — Discharge Instructions (Addendum)
Information on my medicine - XARELTO (rivaroxaban)  This medication education was reviewed with me or my healthcare representative as part of my discharge preparation.  The pharmacist that spoke with me during my hospital stay was:  Eudelia Bunch, [place]? Xarelto was prescribed to treat blood clots that may have been found in the veins of your legs (deep vein thrombosis) or in your lungs (pulmonary embolism) and to reduce the risk of them occurring again.  What do you need to know about Xarelto? The starting dose is one 15 mg tablet taken TWICE daily with food for the FIRST 21 DAYS has been completed, on April 5th  the dose is changed to one 20 mg tablet taken ONCE A DAY with your evening meal.  DO NOT stop taking Xarelto without talking to the health care provider who prescribed the medication.  Refill your prescription for 20 mg tablets before you run out.  After discharge, you should have regular check-up appointments with your healthcare provider that is prescribing your Xarelto.  In the future your dose may need to be changed if your kidney function changes by a significant amount.  What do you do if you miss a dose? If you are taking Xarelto TWICE DAILY and you miss a dose, take it as soon as you remember. You may take two 15 mg tablets (total 30 mg) at the same time then resume your regularly scheduled 15 mg twice daily the next day.  If you are taking Xarelto ONCE DAILY and you miss a dose, take it as soon as you remember on the same day then continue your regularly scheduled once daily regimen the next day. Do not take two doses of Xarelto at the same time.   Important Safety Information Xarelto is a blood thinner medicine that can cause bleeding. You should call your healthcare provider right away if you experience any of the following: ? Bleeding from an injury or your nose that does not stop. ? Unusual colored urine (red or dark brown)  or unusual colored stools (red or black). ? Unusual bruising for unknown reasons. ? A serious fall or if you hit your head (even if there is no bleeding).  Some medicines may interact with Xarelto and might increase your risk of bleeding while on Xarelto. To help avoid this, consult your healthcare provider or pharmacist prior to using any new prescription or non-prescription medications, including herbals, vitamins, non-steroidal anti-inflammatory drugs (NSAIDs) and supplements.  This website has more information on Xarelto: [email or web address] on my medicine - XARELTO (rivaroxaban)  This medication education was reviewed with me or my healthcare representative as part of my discharge preparation.  The pharmacist that spoke with me during my hospital stay was:  Eudelia Bunch, [place]? Xarelto was prescribed to treat blood clots that may have been found in the veins of your legs (deep vein thrombosis) or in your lungs (pulmonary embolism) and to reduce the risk of them occurring again.  What do you need to know about Xarelto? The starting dose is one 15 mg tablet taken TWICE daily with food for the FIRST 21 DAYS which has been completed; on Thursday April 5th  the dose is changed to one 20 mg tablet taken ONCE A DAY with your evening meal.  DO NOT stop taking Xarelto without talking to the health care provider who prescribed the medication.  Refill your prescription for 20 mg tablets  before you run out.  After discharge, you should have regular check-up appointments with your healthcare provider that is prescribing your Xarelto.  In the future your dose may need to be changed if your kidney function changes by a significant amount.  What do you do if you miss a dose? If you are taking Xarelto TWICE DAILY and you miss a dose, take it as soon as you remember. You may take two 15 mg tablets (total 30 mg) at the same time then resume your  regularly scheduled 15 mg twice daily the next day.  If you are taking Xarelto ONCE DAILY and you miss a dose, take it as soon as you remember on the same day then continue your regularly scheduled once daily regimen the next day. Do not take two doses of Xarelto at the same time.   Important Safety Information Xarelto is a blood thinner medicine that can cause bleeding. You should call your healthcare provider right away if you experience any of the following: ? Bleeding from an injury or your nose that does not stop. ? Unusual colored urine (red or dark brown) or unusual colored stools (red or black). ? Unusual bruising for unknown reasons. ? A serious fall or if you hit your head (even if there is no bleeding).  Some medicines may interact with Xarelto and might increase your risk of bleeding while on Xarelto. To help avoid this, consult your healthcare provider or pharmacist prior to using any new prescription or non-prescription medications, including herbals, vitamins, non-steroidal anti-inflammatory drugs (NSAIDs) and supplements.  This website has more information on Xarelto: [email or web address]://guerra-[email or web address].[email or web address] AFTER JOINT REPLACEMENT   o Remove items at home which could result in a fall. This includes throw rugs or furniture in walking pathways o ICE to the affected joint every three hours while awake for 30 minutes at a time, for at least the first 3-5 days, and then as needed for pain and swelling.  Continue to use ice for pain and swelling. You may notice swelling that will progress down to the foot and ankle.  This is normal after surgery.  Elevate your leg when you are not up walking on it.   o Continue to use the breathing machine you got in the hospital (incentive spirometer) which will help keep your temperature down.  It is common for your temperature to cycle up and down following surgery, especially at night when you are not up moving around and exerting yourself.  The breathing  machine keeps your lungs expanded and your temperature down.   DIET:  As you were doing prior to hospitalization, we recommend a well-balanced diet.  DRESSING / WOUND CARE / SHOWERING  Keep the surgical dressing until follow up.  The dressing is water proof, so you can shower without any extra covering.  IF THE DRESSING FALLS OFF or the wound gets wet inside, change the dressing with sterile gauze.  Please use good hand washing techniques before changing the dressing.  Do not use any lotions or creams on the incision until instructed by your surgeon.    ACTIVITY  o Increase activity slowly as tolerated, but follow the weight bearing instructions below.   o No driving for 6 weeks or until further direction given by your physician.  You cannot drive while taking narcotics.  o No lifting or carrying greater than 10 lbs. until further directed by your surgeon. o Avoid periods of inactivity such as sitting longer than an hour when not asleep. This  helps prevent blood clots.  o You may return to work once you are authorized by your doctor.     WEIGHT BEARING   Weight bearing as tolerated with assist device (walker, cane, etc) as directed, use it as long as suggested by your surgeon or therapist, typically at least 4-6 weeks.   EXERCISES  Results after joint replacement surgery are often greatly improved when you follow the exercise, range of motion and muscle strengthening exercises prescribed by your doctor. Safety measures are also important to protect the joint from further injury. Any time any of these exercises cause you to have increased pain or swelling, decrease what you are doing until you are comfortable again and then slowly increase them. If you have problems or questions, call your caregiver or physical therapist for advice.   Rehabilitation is important following a joint replacement. After just a few days of immobilization, the muscles of the leg can become weakened and shrink  (atrophy).  These exercises are designed to build up the tone and strength of the thigh and leg muscles and to improve motion. Often times heat used for twenty to thirty minutes before working out will loosen up your tissues and help with improving the range of motion but do not use heat for the first two weeks following surgery (sometimes heat can increase post-operative swelling).   These exercises can be done on a training (exercise) mat, on the floor, on a table or on a bed. Use whatever works the best and is most comfortable for you.    Use music or television while you are exercising so that the exercises are a pleasant break in your day. This will make your life better with the exercises acting as a break in your routine that you can look forward to.   Perform all exercises about fifteen times, three times per day or as directed.  You should exercise both the operative leg and the other leg as well.  Exercises include:    Quad Sets - Tighten up the muscle on the front of the thigh (Quad) and hold for 5-10 seconds.    Straight Leg Raises - With your knee straight (if you were given a brace, keep it on), lift the leg to 60 degrees, hold for 3 seconds, and slowly lower the leg.  Perform this exercise against resistance later as your leg gets stronger.   Leg Slides: Lying on your back, slowly slide your foot toward your buttocks, bending your knee up off the floor (only go as far as is comfortable). Then slowly slide your foot back down until your leg is flat on the floor again.   Angel Wings: Lying on your back spread your legs to the side as far apart as you can without causing discomfort.   Hamstring Strength:  Lying on your back, push your heel against the floor with your leg straight by tightening up the muscles of your buttocks.  Repeat, but this time bend your knee to a comfortable angle, and push your heel against the floor.  You may put a pillow under the heel to make it more comfortable  if necessary.   A rehabilitation program following joint replacement surgery can speed recovery and prevent re-injury in the future due to weakened muscles. Contact your doctor or a physical therapist for more information on knee rehabilitation.    CONSTIPATION  Constipation is defined medically as fewer than three stools per week and severe constipation as less than one stool per  week.  Even if you have a regular bowel pattern at home, your normal regimen is likely to be disrupted due to multiple reasons following surgery.  Combination of anesthesia, postoperative narcotics, change in appetite and fluid intake all can affect your bowels.   YOU MUST use at least one of the following options; they are listed in order of increasing strength to get the job done.  They are all available over the counter, and you may need to use some, POSSIBLY even all of these options:    Drink plenty of fluids (prune juice may be helpful) and high fiber foods Colace 100 mg by mouth twice a day  Senokot for constipation as directed and as needed Dulcolax (bisacodyl), take with full glass of water  Miralax (polyethylene glycol) once or twice a day as needed.  If you have tried all these things and are unable to have a bowel movement in the first 3-4 days after surgery call either your surgeon or your primary doctor.    If you experience loose stools or diarrhea, hold the medications until you stool forms back up.  If your symptoms do not get better within 1 week or if they get worse, check with your doctor.  If you experience "the worst abdominal pain ever" or develop nausea or vomiting, please contact the office immediately for further recommendations for treatment.   ITCHING:  If you experience itching with your medications, try taking only a single pain pill, or even half a pain pill at a time.  You can also use Benadryl over the counter for itching or also to help with sleep.   TED HOSE STOCKINGS:  Use  stockings on both legs until for at least 2 weeks or as directed by physician office. They may be removed at night for sleeping.  MEDICATIONS:  See your medication summary on the After Visit Summary that nursing will review with you.  You may have some home medications which will be placed on hold until you complete the course of blood thinner medication.  It is important for you to complete the blood thinner medication as prescribed.  PRECAUTIONS:  If you experience chest pain or shortness of breath - call 911 immediately for transfer to the hospital emergency department.   If you develop a fever greater that 101 F, purulent drainage from wound, increased redness or drainage from wound, foul odor from the wound/dressing, or calf pain - CONTACT YOUR SURGEON.                                                   FOLLOW-UP APPOINTMENTS:  If you do not already have a post-op appointment, please call the office for an appointment to be seen by your surgeon.  Guidelines for how soon to be seen are listed in your After Visit Summary, but are typically between 1-4 weeks after surgery.  OTHER INSTRUCTIONS:   Knee Replacement:  Do not place pillow under knee, focus on keeping the knee straight while resting.   MAKE SURE YOU:   Understand these instructions.   Get help right away if you are not doing well or get worse.    Thank you for letting us be a part of your medical care team.  It is a privilege we respect greatly.  We hope these instructions will help you stay on  track for a fast and full recovery!

## 2017-03-25 NOTE — Interval H&P Note (Signed)
History and Physical Interval Note:  03/25/2017 7:13 AM  [name]  has presented today for surgery, with the diagnosis of Left hip osteoarthritis  The various methods of treatment have been discussed with the patient and family. After consideration of risks, benefits and other options for treatment, the patient has consented to  Procedure(s) with comments: LEFT TOTAL HIP ARTHROPLASTY ANTERIOR APPROACH (Left) - Requests 70 mins as a surgical intervention .  The patient's history has been reviewed, patient examined, no change in status, stable for surgery.  I have reviewed the patient's chart and labs.  Questions were answered to the patient's satisfaction.     Mauri Pole

## 2017-03-25 NOTE — Anesthesia Postprocedure Evaluation (Addendum)
Anesthesia Post Note  Patient: [name]  Procedure(s) Performed: Procedure(s) (LRB): LEFT TOTAL HIP ARTHROPLASTY ANTERIOR APPROACH (Left)  Patient location during evaluation: PACU Anesthesia Type: General Level of consciousness: sedated Pain management: pain level controlled Vital Signs Assessment: post-procedure vital signs reviewed and stable Respiratory status: spontaneous breathing and respiratory function stable Cardiovascular status: stable Anesthetic complications: no       Last Vitals:  Vitals:   03/25/17 1115 03/25/17 1125  BP: (!) 162/84 (!) 168/89  Pulse: 92 93  Resp: 13 15  Temp: 36.5 C 36.4 C    Last Pain:  Vitals:   03/25/17 1212  TempSrc:   PainSc: 1                  Amylah Will DANIEL

## 2017-03-25 NOTE — Progress Notes (Signed)
PT Cancellation Note  Patient Details Name: [name] MRN: [id number] DOB: 1956-04-26   Cancelled Treatment:    Reason Eval/Treat Not Completed: Medical issues which prohibited therapy;Fatigue/lethargy limiting ability to participate (pt requests to wait until  tomorrow as she feels loopy. )   Claretha Cooper 03/25/2017, 3:51 PM Tresa Endo PT [phone]

## 2017-03-25 NOTE — Transfer of Care (Signed)
Immediate Anesthesia Transfer of Care Note  Patient: [name]  Procedure(s) Performed: Procedure(s) with comments: LEFT TOTAL HIP ARTHROPLASTY ANTERIOR APPROACH (Left) - Requests 70 mins  Patient Location: PACU  Anesthesia Type:General  Level of Consciousness: awake and oriented  Airway & Oxygen Therapy: Patient Spontanous Breathing and Patient connected to face mask oxygen  Post-op Assessment: Report given to RN and Post -op Vital signs reviewed and stable  Post vital signs: Reviewed and stable  Last Vitals:  Vitals:   03/25/17 0544 03/25/17 1015  BP: 136/70   Pulse: 96   Resp: 16   Temp: 36.8 C (P) 36.9 C    Last Pain:  Vitals:   03/25/17 0544  TempSrc: Oral  PainSc:       Patients Stated Pain Goal: 3 (23/95/32 0233)  Complications: No apparent anesthesia complications

## 2017-03-25 NOTE — Anesthesia Procedure Notes (Signed)
Procedure Name: Intubation Date/Time: 03/25/2017 7:30 AM Performed by: Danley Danker L Patient Re-evaluated:Patient Re-evaluated prior to inductionOxygen Delivery Method: Circle system utilized Preoxygenation: Pre-oxygenation with 100% oxygen Intubation Type: IV induction Ventilation: Mask ventilation without difficulty and Oral airway inserted - appropriate to patient size Laryngoscope Size: Sabra Heck and 2 Grade View: Grade I Tube type: Oral Tube size: 7.5 mm Number of attempts: 1 Airway Equipment and Method: Stylet Placement Confirmation: ETT inserted through vocal cords under direct vision,  positive ETCO2 and breath sounds checked- equal and bilateral Secured at: 21 cm Tube secured with: Tape Dental Injury: Teeth and Oropharynx as per pre-operative assessment

## 2017-03-26 LAB — CBC
HEMATOCRIT: 24.5 % — AB (ref 36.0–46.0)
Hemoglobin: 8.4 g/dL — ABNORMAL LOW (ref 12.0–15.0)
MCH: 26.4 pg (ref 26.0–34.0)
MCHC: 34.3 g/dL (ref 30.0–36.0)
MCV: 77 fL — ABNORMAL LOW (ref 78.0–100.0)
PLATELETS: 328 10*3/uL (ref 150–400)
RBC: 3.18 MIL/uL — ABNORMAL LOW (ref 3.87–5.11)
RDW: 16.5 % — AB (ref 11.5–15.5)
WBC: 10.6 10*3/uL — AB (ref 4.0–10.5)

## 2017-03-26 LAB — BASIC METABOLIC PANEL
ANION GAP: 6 (ref 5–15)
BUN: 8 mg/dL (ref 6–20)
CALCIUM: 8.5 mg/dL — AB (ref 8.9–10.3)
CO2: 26 mmol/L (ref 22–32)
Chloride: 106 mmol/L (ref 101–111)
Creatinine, Ser: 0.59 mg/dL (ref 0.44–1.00)
GFR calc Af Amer: >60 mL/min (ref >60)
GFR calc non Af Amer: >60 mL/min (ref >60)
Glucose, Bld: 132 mg/dL — ABNORMAL HIGH (ref 65–99)
Potassium: 4.1 mmol/L (ref 3.5–5.1)
Sodium: 138 mmol/L (ref 135–145)

## 2017-03-26 MED ORDER — RIVAROXABAN 10 MG PO TABS
10.0000 mg | ORAL_TABLET | Freq: Two times a day (BID) | ORAL | Status: AC
  Administered 2017-03-26 (×2): 10 mg via ORAL
  Filled 2017-03-26 (×2): qty 1

## 2017-03-26 MED ORDER — RIVAROXABAN 20 MG PO TABS: 20.0000 mg | ORAL_TABLET | Freq: Every day | ORAL | 0 refills | Status: AC

## 2017-03-26 MED ORDER — RIVAROXABAN 10 MG PO TABS
20.0000 mg | ORAL_TABLET | Freq: Every day | ORAL | Status: DC
  Administered 2017-03-27: 18:00:00 20 mg via ORAL
  Filled 2017-03-26 (×2): qty 2

## 2017-03-26 MED ORDER — DOXYCYCLINE HYCLATE 100 MG PO CAPS: 100.0000 mg | ORAL_CAPSULE | Freq: Two times a day (BID) | ORAL | 1 refills | Status: DC

## 2017-03-26 NOTE — Evaluation (Signed)
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 She has a PMH of TIA, poly neuropathy and colon CA, for which she underwent chemo/XRT   Clinical Impression   Pt was admitted for the above sx.  Prior to sx, she had excruciating pain and had assistance for adls.  She was cramping during OT evaluation, and could not tolerate sitting up for ADL at this time, although she reports this pain is much better than what she has been experiencing.  Goals in acute are for min A level    Follow Up Recommendations  Supervision/Assistance - 24 hour    Equipment Recommendations  3 in 1 bedside commode (daughter may be getting this for her)    Recommendations for Other Services       Precautions / Restrictions Precautions Precautions: Fall Restrictions Weight Bearing Restrictions: No Other Position/Activity Restrictions: WBAT      Mobility Bed Mobility Overal bed mobility: Needs Assistance Bed Mobility: Supine to Sit     Supine to sit: Min assist     General bed mobility comments: for LLE, HOB raised. Assist for bil LEs for back to bed  Transfers                 General transfer comment: could not tolerate    Balance                                           ADL either performed or assessed with clinical judgement   ADL Overall ADL's : Needs assistance/impaired Eating/Feeding: Independent   Grooming: Set up;Bed level   Upper Body Bathing: Set up;Bed level   Lower Body Bathing: Maximal assistance;Bed level   Upper Body Dressing : Set up;Bed level   Lower Body Dressing: Total assistance;Bed level                 General ADL Comments: pt sat EOB with min A, but she was cramping and could not tolerate sitting upright (leaning to R) to perform ADL.       Vision          Perception     Praxis      Pertinent Vitals/Pain Pain Assessment: Faces Faces Pain Scale: Hurts even more Pain Location: L hip/quad Pain Descriptors / Indicators: Spasm;Cramping;Sore Pain Intervention(s): Limited activity within patient's tolerance;Repositioned;Monitored during session;Premedicated before session (pt declined further ice)     Hand Dominance     Extremity/Trunk Assessment Upper Extremity Assessment Upper Extremity Assessment: Overall WFL for tasks assessed           Communication Communication Communication: No difficulties   Cognition Arousal/Alertness: Awake/alert Behavior During Therapy: WFL for tasks assessed/performed Overall Cognitive Status: Within Functional Limits for tasks assessed                                     General Comments       Exercises     Shoulder Instructions      Home Living Family/patient expects to be discharged to:: Private residence Living Arrangements: Children   Type of Home: House (daughter's house; pt is from [place])  Bathroom Shower/Tub: Teacher, early years/pre: Standard         Additional Comments: daughter is getting 3:1 commode for pt      Prior Functioning/Environment          Comments: pt states she was mostly bedbound for 9 months She had gone to an inpt rehab and dragged foot when she walked        OT Problem List: Decreased strength;Decreased activity tolerance;Impaired balance (sitting and/or standing);Decreased knowledge of use of DME or AE;Pain      OT Treatment/Interventions: Self-care/ADL training;DME and/or AE instruction;Patient/family education;Balance training    OT Goals(Current goals can be found in the care plan section) Acute Rehab OT Goals Patient Stated Goal: walk OT Goal Formulation: With patient Time For Goal Achievement: 04/02/17 Potential to Achieve Goals: Good ADL Goals Pt Will Perform Grooming: with min guard  assist;standing Pt Will Perform Lower Body Bathing: with min assist;sit to/from stand;with adaptive equipment Pt Will Perform Lower Body Dressing: with min assist;with adaptive equipment;sit to/from stand Pt Will Transfer to Toilet: with min assist;ambulating;bedside commode Pt Will Perform Toileting - Clothing Manipulation and hygiene: with min assist;sit to/from stand Additional ADL Goal #1: pt will verbalize vs demonstrate use of tub bench at min A level Additional ADL Goal #2: pt will perform bed mobility at supervision level from flat bed in preparation for adls  OT Frequency: Min 2X/week   Barriers to D/C:            Co-evaluation              End of Session Nurse Communication:  (pain)  Activity Tolerance: Patient limited by pain Patient left: in bed;with call bell/phone within reach  OT Visit Diagnosis: Pain Pain - Right/Left: Left Pain - part of body: Hip                Time: 2409-7353 OT Time Calculation (min): 35 min Charges:  OT General Charges $OT Visit: 1 Procedure OT Evaluation $OT Eval Moderate Complexity: 1 Procedure OT Treatments $Therapeutic Activity: 8-22 mins G-Codes:     [place], OTR/L [phone] 03/26/2017  [name] 03/26/2017, 10:50 AM

## 2017-03-26 NOTE — Progress Notes (Signed)
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 Hx of TIA, neuropathy, DVT, CVA, anxiety, colorectal cancer.     PT Comments    Progressing slowly with mobility. Pt became very anxious with increased RR. Max cues for pursed lip breathing. Reassured pt that she is able to tolerate therapy. Encouraged pt to try to progress activity/ambulation. Will continue to progress activity as able.    Follow Up Recommendations  Home health PT;Supervision/Assistance - 24 hour     Equipment Recommendations  Rolling walker with 5" wheels (youth height)    Recommendations for Other Services       Precautions / Restrictions Precautions Precautions: Fall Restrictions Weight Bearing Restrictions: No LLE Weight Bearing: Weight bearing as tolerated Other Position/Activity Restrictions: WBAT    Mobility  Bed Mobility Overal bed mobility: Needs Assistance Bed Mobility: Supine to Sit;Sit to Supine     Supine to sit: Min assist;HOB elevated Sit to supine: Mod assist;HOB elevated   General bed mobility comments: Assist for LEs. Increased time. Pt raised HOB to ~75 degrees. Pt relied heavily on bedrail.   Transfers Overall transfer level: Needs assistance Equipment used: Rolling walker (2 wheeled) Transfers: Sit to/from Stand Sit to Stand: Min assist;From elevated surface         General transfer comment: Highly elevated bed (per pt request). VCs safety, technique, hand/LE placement.   Ambulation/Gait Ambulation/Gait assistance: Min assist Ambulation Distance (Feet): 6 Feet Assistive device: Rolling walker (2 wheeled) Gait Pattern/deviations: Step-to pattern;Trunk flexed;Antalgic     General Gait Details: Assist to stabilize. VCs safety, technique, sequence. Pt became very anxious with increased RR. Max cues for pursed lip breathing. Pt  became too anxious and fearful to proceed further. Returned to bed at pt's request.    Stairs            Wheelchair Mobility    Modified Rankin (Stroke Patients Only)       Balance Overall balance assessment: Needs assistance         Standing balance support: Bilateral upper extremity supported Standing balance-Leahy Scale: Poor Standing balance comment: requires RW                            Cognition Arousal/Alertness: Awake/alert Behavior During Therapy: WFL for tasks assessed/performed Overall Cognitive Status: Within Functional Limits for tasks assessed                                        Exercises Total Joint Exercises Ankle Circles/Pumps: AROM;Both;10 reps;Supine Quad Sets: AROM;Both;10 reps;Supine Heel Slides: AAROM;Left;10 reps;Supine    General Comments        Pertinent Vitals/Pain Pain Assessment: Faces Faces Pain Scale: Hurts even more Pain Location: L hig/thigh Pain Descriptors / Indicators: Sore Pain Intervention(s): Limited activity within patient's tolerance;Repositioned    Home Living Family/patient expects to be discharged to:: Private residence Living Arrangements: Children Available Help at Discharge: Family Type of Home: House Home Access: Stairs to enter Entrance Stairs-Rails:  (pt is unsure) Home Layout: One level Home Equipment: Wheelchair - manual Additional Comments: daughter is getting 3:1 commode for pt    Prior Function        Comments: pt states she was mostly bedbound for 9 months She had  gone to an inpt rehab and dragged foot when she walked   PT Goals (current goals can now be found in the care plan section) Acute Rehab PT Goals Patient Stated Goal: walk without dragging leg PT Goal Formulation: With patient Time For Goal Achievement: 04/09/17 Potential to Achieve Goals: Good Progress towards PT goals: Progressing toward goals (very slowly)    Frequency    7X/week      PT  Plan Current plan remains appropriate    Co-evaluation             End of Session Equipment Utilized During Treatment: Gait belt Activity Tolerance: Patient limited by fatigue (Limited by anxiety) Patient left: in bed;with call bell/phone within reach   PT Visit Diagnosis: Muscle weakness (generalized) (M62.81);Difficulty in walking, not elsewhere classified (R26.2)     Time: 6295-2841 PT Time Calculation (min) (ACUTE ONLY): 21 min  Charges:  $Gait Training: 8-22 mins $Therapeutic Exercise: 8-22 mins                    G Codes:         Weston Anna, MPT Pager: [phone]

## 2017-03-26 NOTE — Progress Notes (Signed)
     Subjective: 1 Day Post-Op Procedure(s) (LRB): LEFT TOTAL HIP ARTHROPLASTY ANTERIOR APPROACH (Left)   Patient reports pain as moderate, pain controlled.  No events throughout the night.  Patient has been not using the left leg for about a year.  Will need to work with PT for ambulation safety as well as strengthening.   Objective:   VITALS:   Vitals:   03/26/17 0123 03/26/17 0533  BP: 122/84 (!) 107/56  Pulse: 100 94  Resp: 17 16  Temp: 97.9 F (36.6 C) 97.6 F (36.4 C)    Dorsiflexion/Plantar flexion intact Incision: dressing C/D/I No cellulitis present Compartment soft  LABS  Recent Labs  03/26/17 0417  HGB 8.4*  HCT 24.5*  WBC 10.6*  PLT 328     Recent Labs  03/26/17 0417  NA 138  K 4.1  BUN 8  CREATININE 0.59  GLUCOSE 132*     Assessment/Plan: 1 Day Post-Op Procedure(s) (LRB): LEFT TOTAL HIP ARTHROPLASTY ANTERIOR APPROACH (Left)  Foley cath d/c'ed  Advance diet  Up with therapy  D/C IV fluids  Will change the patient to Xarelto 10 mg bid today, will change her to 20 mg when she goes home.  She is on Xarelto prior to surgery because of a previous DVT.  She is getting on the starter dose and starting the full dose.  Plan on Doxy when she is planned on home  Discharge home eventually when ready, possibly tomorrow     West Pugh. Erryn Dickison   PAC  03/26/2017, 7:54 AM

## 2017-03-26 NOTE — Evaluation (Signed)
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 Hx of TIA, neuropathy, DVT, CVA, anxiety, colorectal cancer.   Clinical Impression  On eval, pt required Min assist for mobility. She took a few pivotal steps in room to get to the recliner. She declined hallway ambulation this session. Encouraged pt to try to walk into hallway in next session. Anticipate progress may be slow. Will progress as able. Recommend HHPT follow up.     Follow Up Recommendations Home health PT;Supervision/Assistance - 24 hour    Equipment Recommendations  Rolling walker with 5" wheels (youth height)    Recommendations for Other Services       Precautions / Restrictions Precautions Precautions: Fall Restrictions Weight Bearing Restrictions: No LLE Weight Bearing: Weight bearing as tolerated Other Position/Activity Restrictions: WBAT      Mobility  Bed Mobility Overal bed mobility: Needs Assistance Bed Mobility: Supine to Sit     Supine to sit: Min assist;HOB elevated     General bed mobility comments: Assist for L LE. Increased time. Pt raised HOB to ~75 degrees. Pt relied heavily on bedrail.   Transfers Overall transfer level: Needs assistance Equipment used: Rolling walker (2 wheeled) Transfers: Sit to/from [facility] Sit to Stand: Min assist;From elevated surface Stand pivot transfer: Min assist         General transfer comment: Highly elevated bed (per pt request). Pt pulled up on walker. She prefers to do things in her own manner despite cueing from therapist. Stand pivot, bed to recliner, with RW. Increased time.   Ambulation/Gait             General Gait Details: Pt declined ambulation this session  Stairs            Wheelchair Mobility    Modified Rankin (Stroke Patients Only)       Balance Overall  balance assessment: Needs assistance         Standing balance support: Bilateral upper extremity supported Standing balance-Leahy Scale: Poor                               Pertinent Vitals/Pain Pain Assessment: Faces Faces Pain Scale: Hurts even more Pain Location: L hip/thigh Pain Descriptors / Indicators: Spasm;Sore Pain Intervention(s): Limited activity within patient's tolerance;Repositioned    Home Living Family/patient expects to be discharged to:: Private residence Living Arrangements: Children Available Help at Discharge: Family Type of Home: House Home Access: Stairs to enter Entrance Stairs-Rails:  (pt is unsure) Technical brewer of Steps: 3 Home Layout: One level Home Equipment: None Additional Comments: daughter is getting 3:1 commode for pt    Prior Function           Comments: pt states she was mostly bedbound for 9 months She had gone to an inpt rehab and dragged foot when she walked     Hand Dominance        Extremity/Trunk Assessment   Upper Extremity Assessment Upper Extremity Assessment: Defer to OT evaluation    Lower Extremity Assessment Lower Extremity Assessment: Generalized weakness    Cervical / Trunk Assessment Cervical / Trunk Assessment: Normal  Communication   Communication: No difficulties  Cognition Arousal/Alertness: Awake/alert Behavior During Therapy: WFL for tasks assessed/performed Overall Cognitive Status: Within Functional Limits for tasks assessed  General Comments      Exercises Total Joint Exercises Ankle Circles/Pumps: AROM;Both;10 reps;Supine Quad Sets: AROM;Both;10 reps;Supine Heel Slides: AAROM;Left;10 reps;Supine   Assessment/Plan    PT Assessment Patient needs continued PT services  PT Problem List Decreased strength;Decreased mobility;Decreased activity tolerance;Decreased balance;Decreased knowledge of use of  DME;Pain;Decreased range of motion       PT Treatment Interventions DME instruction;Therapeutic activities;Gait training;Therapeutic exercise;Patient/family education;Balance training;Functional mobility training    PT Goals (Current goals can be found in the Care Plan section)  Acute Rehab PT Goals Patient Stated Goal: walk without dragging leg PT Goal Formulation: With patient Time For Goal Achievement: 04/09/17 Potential to Achieve Goals: Good    Frequency 7X/week   Barriers to discharge        Co-evaluation               End of Session Equipment Utilized During Treatment: Gait belt Activity Tolerance: Patient limited by fatigue;Patient limited by pain Patient left: in chair;with call bell/phone within reach   PT Visit Diagnosis: Muscle weakness (generalized) (M62.81);Difficulty in walking, not elsewhere classified (R26.2)    Time: 5790-3833 PT Time Calculation (min) (ACUTE ONLY): 26 min   Charges:   PT Evaluation $PT Eval Low Complexity: 1 Procedure PT Treatments $Therapeutic Exercise: 8-22 mins   PT G Codes:         Weston Anna, MPT Pager: [phone]

## 2017-03-27 LAB — CBC
HCT: 20.3 % — ABNORMAL LOW (ref 36.0–46.0)
Hemoglobin: 7.1 g/dL — ABNORMAL LOW (ref 12.0–15.0)
MCH: 27 pg (ref 26.0–34.0)
MCHC: 35 g/dL (ref 30.0–36.0)
MCV: 77.2 fL — AB (ref 78.0–100.0)
PLATELETS: 263 10*3/uL (ref 150–400)
RBC: 2.63 MIL/uL — AB (ref 3.87–5.11)
RDW: 16.6 % — AB (ref 11.5–15.5)
WBC: 10.1 10*3/uL (ref 4.0–10.5)

## 2017-03-27 LAB — BASIC METABOLIC PANEL
Anion gap: 5 (ref 5–15)
BUN: 9 mg/dL (ref 6–20)
CALCIUM: 8.2 mg/dL — AB (ref 8.9–10.3)
CO2: 26 mmol/L (ref 22–32)
CREATININE: 0.59 mg/dL (ref 0.44–1.00)
Chloride: 107 mmol/L (ref 101–111)
GFR calc Af Amer: >60 mL/min (ref >60)
GLUCOSE: 88 mg/dL (ref 65–99)
Potassium: 3.4 mmol/L — ABNORMAL LOW (ref 3.5–5.1)
SODIUM: 138 mmol/L (ref 135–145)

## 2017-03-27 LAB — PREPARE RBC (CROSSMATCH)

## 2017-03-27 MED ORDER — SODIUM CHLORIDE 0.9 % IV BOLUS (SEPSIS)
250.0000 mL | Freq: Once | INTRAVENOUS | Status: AC
Start: 2017-03-27 — End: 2017-03-27
  Administered 2017-03-27: 250 mL via INTRAVENOUS

## 2017-03-27 MED ORDER — LORAZEPAM 2 MG/ML IJ SOLN
0.5000 mg | Freq: Two times a day (BID) | INTRAMUSCULAR | Status: DC | PRN
  Administered 2017-03-27: 12:00:00 0.5 mg via INTRAVENOUS
  Filled 2017-03-27: qty 1

## 2017-03-27 MED ORDER — SODIUM CHLORIDE 0.9 % IV BOLUS (SEPSIS)
250.0000 mL | Freq: Once | INTRAVENOUS | Status: AC
Start: 2017-03-27 — End: 2017-03-27
  Administered 2017-03-27: 18:00:00 250 mL via INTRAVENOUS

## 2017-03-27 MED ORDER — SODIUM CHLORIDE 0.9 % IV BOLUS (SEPSIS)
250.0000 mL | Freq: Once | INTRAVENOUS | Status: AC
  Administered 2017-03-27: 250 mL via INTRAVENOUS

## 2017-03-27 MED ORDER — LORAZEPAM BOLUS VIA INFUSION: 0.5000 mg | Freq: Two times a day (BID) | INTRAVENOUS | Status: DC | PRN

## 2017-03-27 MED ORDER — SODIUM CHLORIDE 0.9 % IV SOLN
Freq: Once | INTRAVENOUS | Status: AC
  Administered 2017-03-27: 19:00:00 via INTRAVENOUS

## 2017-03-27 NOTE — Progress Notes (Signed)
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 Hx of TIA, neuropathy, DVT, CVA, anxiety, colorectal cancer.     PT Comments    Pt is not progressing well. During session, pt made therapist aware that she had lost a family member. She was tearful but agreeable to working with therapy. Pt was anxious prior to mobilizing however pt's anxiety level increased even higher once ambulating (as it did on yesterday). Max cues for pursed lip breathing. Encouraged pt to try to increase ambulation distance however she was resistant to this so assisted pt into the recliner and returned her to her room. Will continue to try and progress activity as able.   Follow Up Recommendations  Home health PT;Supervision/Assistance - 24 hour     Equipment Recommendations  Rolling walker with 5" wheels (youth height. Likely will require ambulance transport home. )    Recommendations for Other Services       Precautions / Restrictions Precautions Precautions: Fall Restrictions Weight Bearing Restrictions: No LLE Weight Bearing: Weight bearing as tolerated    Mobility  Bed Mobility Overal bed mobility: Needs Assistance Bed Mobility: Supine to Sit     Supine to sit: Min assist;HOB elevated     General bed mobility comments: Assist for LE Increased time. Pt raised HOB to ~75 degrees. Pt relied heavily on bedrail.   Transfers Overall transfer level: Needs assistance Equipment used: Rolling walker (2 wheeled) Transfers: Sit to/from Stand Sit to Stand: Mod assist;From elevated surface         General transfer comment: slightly less elevated bed height to work on transitions. Assist to rise, stabilize, control descent. VCs safety, technique, hand/LE placement.   Ambulation/Gait Ambulation/Gait assistance: Min assist;+2 safety/equipment Ambulation  Distance (Feet): 6 Feet Assistive device: Rolling walker (2 wheeled) Gait Pattern/deviations: Step-to pattern;Trunk flexed     General Gait Details: +2 safety for chair follow. Assist to stabilize. VCs safety, technique, sequence. Again pt became very anxious. She c/o fatigue and declined to progress ambulation distance any further. Assisted pt into recliner.    Stairs            Wheelchair Mobility    Modified Rankin (Stroke Patients Only)       Balance Overall balance assessment: Needs assistance           Standing balance-Leahy Scale: Poor                              Cognition Arousal/Alertness: Awake/alert Behavior During Therapy: Anxious Overall Cognitive Status: Within Functional Limits for tasks assessed                                 General Comments: progress has been limited by anxiety, fear. This a.m. pt received bad news about a death in the family      Exercises      General Comments General comments (skin integrity, edema, etc.): provided level 2 theraband and performed horizontal abduction.  Pt fatiqued after this.  Educated on shoulder flexion and extension, but did not do these      Pertinent Vitals/Pain Pain Assessment: Faces Faces Pain Scale: Hurts even more Pain Location: L hip Pain Descriptors / Indicators: Sore Pain Intervention(s): Limited activity within patient's tolerance;Repositioned  Home Living                      Prior Function            PT Goals (current goals can now be found in the care plan section) Progress towards PT goals: Not progressing toward goals - comment (pt tends to self limit her progress. She is also very anxious)    Frequency    7X/week      PT Plan Current plan remains appropriate    Co-evaluation             End of Session Equipment Utilized During Treatment: Gait belt Activity Tolerance: Patient limited by fatigue (Limited by anxiety) Patient  left: in chair;with call bell/phone within reach   PT Visit Diagnosis: Muscle weakness (generalized) (M62.81);Difficulty in walking, not elsewhere classified (R26.2)     Time: 0347-4259 PT Time Calculation (min) (ACUTE ONLY): 17 min  Charges:  $Gait Training: 8-22 mins                    G Codes:          Weston Anna, MPT Pager: [phone]

## 2017-03-27 NOTE — Progress Notes (Signed)
     Subjective: 2 Days Post-Op Procedure(s) (LRB): LEFT TOTAL HIP ARTHROPLASTY ANTERIOR APPROACH (Left)   Patient reports pain as mild, controlled. No events throughout the night.  Concerns that she didn't ambulate much.  We have reviewed that she was not walking much prior to surgery.  She feels that some of her issues with walking are associated with a bit of anxiety.  Will allow her to work with PT again today to try to help alleviate some of her anxiety.  Objective:   VITALS:   Vitals:   03/26/17 2147 03/27/17 0601  BP: (!) 125/55 113/60  Pulse: 91 88  Resp: 18 18  Temp: 97.7 F (36.5 C) 98 F (36.7 C)    Dorsiflexion/Plantar flexion intact Incision: dressing C/D/I No cellulitis present Compartment soft  LABS  Recent Labs  03/26/17 0417 03/27/17 0422  HGB 8.4* 7.1*  HCT 24.5* 20.3*  WBC 10.6* 10.1  PLT 328 263     Recent Labs  03/26/17 0417 03/27/17 0422  NA 138 138  K 4.1 3.4*  BUN 8 9  CREATININE 0.59 0.59  GLUCOSE 132* 88     Assessment/Plan: 2 Days Post-Op Procedure(s) (LRB): LEFT TOTAL HIP ARTHROPLASTY ANTERIOR APPROACH (Left) D/c foley cath today Up with therapy Discharge home eventually when ready, probably tomorrow.     West Pugh Rosemarie Galvis   PAC  03/27/2017, 7:53 AM

## 2017-03-27 NOTE — Progress Notes (Signed)
PT Cancellation Note  Patient Details Name: [name] MRN: [id number] DOB: Nov 29, 1956   Cancelled Treatment:    Reason Eval/Treat Not Completed: Medical issues which prohibited therapy. Spoke with RN who recommended PT be held for the afternoon. Pt resting and is to receive blood transfusion. Will check back tomorrow morning.    Weston Anna, MPT Pager: [phone]

## 2017-03-27 NOTE — Care Management Note (Signed)
Case Management Note  Patient Details  Name: [name] MRN: [id number] Date of Birth: September 23, 1956  Subjective/Objective:    61 yo admitted for L THA                Action/Plan: Pt from home with children.  Pt offered choice for HHPT and [facility] chosen. [facility] rep given referral. Pt states she needs a RW and 3in1. Orders received  and [facility] DME rep contacted for DME. Will need HHPT order at discharge. No other CM needs communicated.   Expected Discharge Date:  03/26/17               Expected Discharge Plan:  [place]  In-House Referral:     Discharge planning Services  CM Consult  Post Acute Care Choice:  Home Health Choice offered to:  Patient  DME Arranged:  3-N-1, Walker rolling DME Agency:  [place]:  PT [place]:  [facility] (now [facility])  Status of Service:  Completed, signed off  If discussed at [employer] of Stay Meetings, dates discussed:    Additional CommentsLynnell Catalan, RN 03/27/2017, 12:30 PM  [phone]

## 2017-03-27 NOTE — Progress Notes (Signed)
Okay to remove Foley in the morning 03/28/17 per [place]. Pt is receiving second unit of blood at this time. Will continue to monitor.

## 2017-03-27 NOTE — Op Note (Signed)
NAMEMarland Kitchen  [name], [name]         ACCOUNT NO.:  [id number]  MEDICAL RECORD NO.:  [id number]  LOCATION:  [facility]                         FACILITY:  [facility]  PHYSICIAN:  Pietro Cassis. Alvan Dame, M.D.  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  FINDINGS:  Please note that the patient noted to have extremely scarred- in left hip joint.  There were no signs of obvious purulence around the hip joint.  Her femoral head had a significantly degraded appearance consistent with a chronic avascular process versus severe degenerative joint changes.  This seemed to be secondary to something.  PROCEDURE:  Left total hip arthroplasty utilizing DePuy hip system with a 52-mm Pinnacle shell, 36+ 4 neutral AltrX liner, one-high Tri-Lock stem with a 36+ 5 Delta ceramic ball.  SURGEON:  Pietro Cassis. Alvan Dame, M.D.  ASSISTANT:  Danae Orleans, PA-C.  Noted that, Mr. [name] was present for the entirety of the case from preoperative position, perioperative management of the operative extremity, general facilitation of the case and primary wound closure.  ANESTHESIA:  General.  BLOOD LOSS:  700 mL.  DRAINS:  None.  COMPLICATION:  None.  INDICATIONS FOR PROCEDURE:  [name] is a 61 year old female who was seen in the office for second opinion evaluation of left hip.  At the time of evaluation, she reported up to year of challenges dealing with this left hip.  She had been seen and evaluated initially in [place], then had conservative treatment recommended.  She had basically lost function, though her upper extremities  were function and her right lower extremity was fine.  The amount of pain that she experienced in her left hip prevented her from functioning at all.  She had no other procedures performed.  Radiographs in the office revealed advanced degenerative joint changes.  She was scheduled for surgery.  We reviewed the risk of infection, DVT, component failure, need for future surgeries.  We discussed significant leg length discrepancy as she developed and attempt to try to restore some of this.  Consent was obtained for benefit of pain relief.  PROCEDURE IN DETAIL:  The patient was brought to the operative theater. Once adequate anesthesia, preoperative antibiotics, Ancef administered as well as 1 g of tranexamic acid and 10 mg of Decadron, she was positioned supine on the Hana table.  Her left hip was prepped and draped in sterile fashion once she was adequately positioned.  Time-out was performed identifying the patient, planned procedure and extremity.  At this point, incision was made couple of centimeters lateral to the anterior-superior iliac spine in orientation with the tensor fascia lata.  Soft tissue dissection was carried down to the fascia, which was then incised.  At this point, we identified identification of this area was significantly scarred down.  Again, there were no signs of purulence or infection; however, it had the appearance and feel of a revision surgery.  I  was able to identify the tensor fascia lata muscle, which was fairly diminutive as well as though contracted until the stimulation was pretty weak.  It was swept laterally and retractors were placed over the superior neck.  A second retractor was placed inferiorly and then I confirmed this radiographically due to the challenges of the adhesions around the hip. Once I confirmed the orientation of the retractors, I was able to debride some of the scarred-in pericapsular fat and soft tissues cauterizing vessels as  needed in this area.  Once I exposed the hip capsule, an arthrotomy was made.  At this point, I performed a capsulectomy initially in this anterior portion of the hip, exposing the joint.  Again confirming the several of these phases under fluoroscopic imaging due to the challenges of the case, I then applied traction with the table, locked it down and then used an oscillating saw, created an osteotomy.  The femoral head was removed and found to be as noted significantly degenerative in nature.  Traction was let off and attention was now directed to the acetabulum and was able to get retractors placed around the acetabulum in a standard fashion.  I then was able to perform further capsulectomy as well as to bring the soft tissue around the hip.  Under fluoroscopic imaging, I elected to evaluate the acetabulum with the reamers in place as there was significant superior erosion for her acetabulum.  I began as far inferior as I possibly could with the acetabular shell or reaming.  The bone was noted to be very soft, reamed up from a 45-mm reamer up to 51- mm reamer and was able to use a 52 cup with good purchase.  There were some defects in the superior acetabulum, for which, I placed 5 mL of cancellous bone graft and reverse reamed to the 51 reamer.  A 52 shell was then impacted under fluoroscopic imaging to an appropriate abduction and forward flexion.  A single cancellous screw was placed into the ilium.  At this point, the hole eliminator was placed and the final 36+ 4 neutral AltrX liner impacted into position.  At this point, attention was directed to the femur.  The femur was externally rotated initially to about 80 degrees.  This allowed for me to work on exposure of the proximal aspect of the femur, so it could be better delivered to expose the hip.  The proximal femur was noted to be fairly sclerotic in nature.  I did open up a starting reaming awl to open up the proximal  femur and then was able to use a box osteotome.  Given the complexity of the proximal femur, I did bring in fluoro to make certain that even the box osteotome was in the correct orientation.  Once I was satisfied with this, I began reaming with a chili pepper broach and then a 0 broach, again checking the orientation fluoroscopically.  These rechecks allowed for further exposure of the proximal femur and lot of rotation and ultimately, it was about 100 degrees  I broached up to just a size 1, which had good fit and fill in the proximal femur and did trial reductions.  Trial reduction with a 1.5 ball was even challenge to reduce the hip.  Identified further soft tissues and some bone that needed to be removed off the posterior aspect of the hip.  Fluoroscopically, the components appeared to be well positioned and fixed and well sized; however, there was still shortening to this left  lower extremity.  However, as noted, the reduction was quite challenging.  At this point, the trial components were dislocated and removed out of the femur.  The final size 1 Tri-Lock stem was selected and impacted.  I then selected a 36 +5 ceramic ball and impacted on the clean and dried trunnion after traction as well as pushing proximally, I was able to reduce the hip.  Fluoroscopy again identified that leg was still shorter than the right side, but had made significant gains.  At this point, we irrigated the hip as we had done throughout the case.  There was no capsular repair inset.  I reapproximated the tensor fascia lata utilizing a #1 Vicryl and 0 V-Loc suture.  The remainder of the wound was closed with 0 Vicryl and a running 4-0 Monocryl.  The hip was then cleaned, dried and dressed sterilely using surgical glue and Aquacel dressing.  She was then brought to the recovery room in stable condition tolerating the procedure well.  At the end of the case, I reviewed the findings with her daughter.   She will be in the hospital prior for couple of days based on intraoperative findings.  There was no evidence of any purulence.  I will probably be very cautious and overtreat her with some oral antibiotics for some time, make certain we do not want any challenges there.     Pietro Cassis Alvan Dame, M.D.     MDO/MEDQ  D:  03/26/2017  T:  03/26/2017  Job:  [id number]

## 2017-03-27 NOTE — Progress Notes (Signed)
OT Cancellation Note  Patient Details Name: [name] MRN: [id number] DOB: [birth year]   Cancelled Treatment:    Reason Eval/Treat Not Completed: Other (comment).  Pt sleeping soundly.  RN had given pt ativan earlier.  Will check back tomorrow  Jamilette Suchocki 03/27/2017, 2:59 PM  Lesle Chris, OTR/L [phone] 03/27/2017

## 2017-03-27 NOTE — Progress Notes (Signed)
Patient very tearful this morning. Provided empathy and reassurance. Later on in the morning she received news that her uncle who she was close to passed away. Patient became hysterical and unable to console. Received a verbal order from [facility] PA for 0.5 mg Ativan BID prn. Patient resting quietly at this time.

## 2017-03-27 NOTE — Progress Notes (Addendum)
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 Hx of TIA, neuropathy, DVT, CVA, anxiety, colorectal cancer.    OT comments  Pt limited by pain this session.  Performed theraband exercises. Will return later to try to make 3:1 commode sitting more comfortable for her as she wasn't able to sit with weight on L hip yesterday  Follow Up Recommendations  Supervision/Assistance - 24 hour    Equipment Recommendations  3 in 1 bedside commode    Recommendations for Other Services      Precautions / Restrictions Precautions Precautions: Fall Restrictions LLE Weight Bearing: Weight bearing as tolerated       Mobility Bed Mobility                  Transfers                      Balance                                           ADL either performed or assessed with clinical judgement   ADL                                         General ADL Comments: pt cramping; requested a muscle relaxant.  Pt emotional talking about how they told her nothing was wrong. She still has discomfort but feels much better than before sx.  Pt frustrated that she runs out of breath.  educated to count out loud when performing theraband exercises     Vision       Perception     Praxis      Cognition Arousal/Alertness: Awake/alert Behavior During Therapy: WFL for tasks assessed/performed Overall Cognitive Status: Within Functional Limits for tasks assessed                                          Exercises     Shoulder Instructions       General Comments provided level 2 theraband and performed horizontal abduction.  Pt fatiqued after this.  Educated on shoulder flexion and extension, but did not do these    Pertinent Vitals/ Pain       Faces Pain Scale: Hurts even  more Pain Location: L hip Pain Descriptors / Indicators: Sore Pain Intervention(s): Limited activity within patient's tolerance;Monitored during session;Repositioned;Premedicated before session  Home Living                                          Prior Functioning/Environment              Frequency           Progress Toward Goals  OT Goals(current goals can now be found in the care plan section)     Acute Rehab OT Goals Time For Goal Achievement: 04/02/17  Plan   plan remains appropriate   Co-evaluation  End of Session  left in bed; call bell within reach; RN in room      Activity Tolerance  limited by pain   Patient Left     Nurse Communication          Time: 7225-7505 OT Time Calculation (min): 19 min  Charges: OT General Charges $OT Visit: 1 Procedure OT Treatments $Therapeutic Activity: 8-22 mins  Lesle Chris, OTR/L [phone] 03/27/2017   Decorey Wahlert 03/27/2017, 9:22 AM

## 2017-03-28 LAB — TYPE AND SCREEN
ABO/RH(D): O POS
Antibody Screen: NEGATIVE
UNIT DIVISION: 0
Unit division: 0

## 2017-03-28 LAB — BPAM RBC
BLOOD PRODUCT EXPIRATION DATE: 201804252359
Blood Product Expiration Date: 201804252359
ISSUE DATE / TIME: 201804051838
ISSUE DATE / TIME: 201804052155
UNIT TYPE AND RH: 5100
Unit Type and Rh: 5100

## 2017-03-28 LAB — HEMOGLOBIN AND HEMATOCRIT, BLOOD
HEMATOCRIT: 28.2 % — AB (ref 36.0–46.0)
HEMOGLOBIN: 9.8 g/dL — AB (ref 12.0–15.0)

## 2017-03-28 NOTE — Progress Notes (Signed)
     Subjective: 3 Days Post-Op Procedure(s) (LRB): LEFT TOTAL HIP ARTHROPLASTY ANTERIOR APPROACH (Left)   Patient reports pain as mild, pain controlled. Patient states that she doesn't really want to use the word pain as it is almost nothing when compared to prior to surgery and the year she has been living in pain.  She is progressing well and labs look better today.  Unfortunately she has a death of an Uncle with whom she was very close. I had a discussion with the patient's daughter with what to expect and how the patient is doing.  Ready to be discharged home.   Objective:   VITALS:   Vitals:   03/28/17 0057 03/28/17 0613  BP: 124/60 (!) 128/58  Pulse: 88 88  Resp: 16 15  Temp: 98.2 F (36.8 C) 98.7 F (37.1 C)    Dorsiflexion/Plantar flexion intact Incision: dressing C/D/I No cellulitis present Compartment soft  LABS  Recent Labs  03/26/17 0417 03/27/17 0422 03/28/17 0250  HGB 8.4* 7.1* 9.8*  HCT 24.5* 20.3* 28.2*  WBC 10.6* 10.1  --   PLT 328 263  --      Recent Labs  03/26/17 0417 03/27/17 0422  NA 138 138  K 4.1 3.4*  BUN 8 9  CREATININE 0.59 0.59  GLUCOSE 132* 88     Assessment/Plan: 3 Days Post-Op Procedure(s) (LRB): LEFT TOTAL HIP ARTHROPLASTY ANTERIOR APPROACH (Left) Up with therapy Discharge home with home health  Follow up in 2 weeks at [facility]. Follow up with OLIN,Ezequias Lard D in 2 weeks.  Contact information:  [facility] [address], [address] [place] Lillie [place] Sw[place]k   PAC  03/28/2017, 8:17 AM

## 2017-03-28 NOTE — Progress Notes (Signed)
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 Hx of TIA, neuropathy, DVT, CVA, anxiety, colorectal cancer.     PT Comments    Assisted OOB with increased time and much VC's to complete.  Assisted with amb a limited distance due to pt's emotional state.  Moaning, crying with pain.  Pt only tolerated amb to the bathroom.  Required assist in bathroom, thena short distance to recliner.    Follow Up Recommendations        Equipment Recommendations  Rolling walker with 5" wheels    Recommendations for Other Services       Precautions / Restrictions Precautions Precautions: Fall Restrictions Weight Bearing Restrictions: No LLE Weight Bearing: Weight bearing as tolerated    Mobility  Bed Mobility Overal bed mobility: Needs Assistance Bed Mobility: Supine to Sit     Supine to sit: Min assist;Mod assist Sit to supine: Mod assist;HOB elevated   General bed mobility comments: increased, increased time with HOB elevated and use of bed pad to complete scooting  Transfers Overall transfer level: Needs assistance Equipment used: Rolling walker (2 wheeled) Transfers: Sit to/from Stand Sit to Stand: Mod assist;From elevated surface         General transfer comment: 75% VC's on proper hand placement and safety with turns.  Pt demonstartes impaired cognition and very emotional.  HIGH FALL RISK.   Ambulation/Gait Ambulation/Gait assistance: Min assist;Mod assist Ambulation Distance (Feet): 8 Feet Assistive device: Rolling walker (2 wheeled) Gait Pattern/deviations: Step-to pattern;Trunk flexed     General Gait Details: required increased, increased time and 75% VC's on proper sequencing, proper walker to self distance and safety with turns.  Pt emotional.  Crying.  Only amb to bathroom then just few feet out the bathroom door to  recliner.     Stairs            Wheelchair Mobility    Modified Rankin (Stroke Patients Only)       Balance             Standing balance-Leahy Scale: Poor                              Cognition Arousal/Alertness: Awake/alert Behavior During Therapy: Anxious Overall Cognitive Status: Within Functional Limits for tasks assessed                                 General Comments: very emotional with anxiety and fear      Exercises      General Comments        Pertinent Vitals/Pain Pain Assessment: Faces Faces Pain Scale: Hurts whole lot Pain Location: L hip Pain Descriptors / Indicators: Crying;Discomfort;Grimacing;Moaning Pain Intervention(s): Monitored during session;Repositioned;Ice applied    Home Living                      Prior Function            PT Goals (current goals can now be found in the care plan section) Progress towards PT goals: Progressing toward goals    Frequency    7X/week      PT Plan Current plan remains appropriate    Co-evaluation  End of Session Equipment Utilized During Treatment: Gait belt Activity Tolerance: Patient limited by fatigue;Other (comment) (emotional state) Patient left: in chair;with chair alarm set Nurse Communication: Mobility status PT Visit Diagnosis: Muscle weakness (generalized) (M62.81);Difficulty in walking, not elsewhere classified (R26.2)     Time: 0518-3358 PT Time Calculation (min) (ACUTE ONLY): 33 min  Charges:  $Gait Training: 8-22 mins $Therapeutic Activity: 8-22 mins                    G Codes:       Rica Koyanagi  PTA [facility]  Acute  Rehab Pager      [phone]

## 2017-03-28 NOTE — Progress Notes (Signed)
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 Hx of TIA, neuropathy, DVT, CVA, anxiety, colorectal cancer.    OT comments  Pt had increased pain and was tearful. Practiced transfer to 3:1 then repositioned in bed  Follow Up Recommendations  Supervision/Assistance - 24 hour    Equipment Recommendations  3 in 1 bedside commode    Recommendations for Other Services      Precautions / Restrictions Precautions Precautions: Fall Restrictions LLE Weight Bearing: Weight bearing as tolerated       Mobility Bed Mobility           Sit to supine: Mod assist;HOB elevated      Transfers   Equipment used: Rolling walker (2 wheeled)   Sit to Stand: Mod assist;From elevated surface         General transfer comment: cues to scoot forward, cues for UE/LE placemetn    Balance             Standing balance-Leahy Scale: Poor                             ADL either performed or assessed with clinical judgement   ADL Overall ADL's : Needs assistance/impaired                         Toilet Transfer: Moderate assistance;Stand-pivot;BSC;RW             General ADL Comments: mod A to stand from [facility] and recliner.  Pt was in tears from pain/discomfort sitting up.  Tolerated sitting up on 3:1 well.  Had to support foot on pillow as commode wasn't low enough.  She may want to set her commode on lowest setting but stand up will be more difficult.     Vision       Perception     Praxis      Cognition Arousal/Alertness: Awake/alert Behavior During Therapy: Anxious Overall Cognitive Status: Within Functional Limits for tasks assessed                                 General Comments: progress has been limited by anxiety, fear. This a.m. pt received bad news about a death in the family        Exercises     Shoulder Instructions       General Comments      Pertinent Vitals/ Pain       Faces Pain Scale: Hurts whole lot Pain Location: L hip Pain Descriptors / Indicators: Sore Pain Intervention(s): Limited activity within patient's tolerance;Monitored during session;Repositioned;Premedicated before session;Ice applied  Home Living                                          Prior Functioning/Environment              Frequency  Min 2X/week        Progress Toward Goals  OT Goals(current goals can now be found in the care plan section)  Progress towards OT goals: Progressing toward goals     Plan      Co-evaluation  End of Session    OT Visit Diagnosis: Pain Pain - Right/Left: Left Pain - part of body: Hip   Activity Tolerance Patient limited by pain   Patient Left in bed;with call bell/phone within reach;with family/visitor present   Nurse Communication          Time: 1100-1130 OT Time Calculation (min): 30 min  Charges: OT General Charges $OT Visit: 1 Procedure OT Treatments $Self Care/Home Management : 23-37 mins  Lesle Chris, OTR/L [phone] 03/28/2017   [place] 03/28/2017, 12:10 PM

## 2017-03-31 NOTE — Discharge Summary (Signed)
Physician Discharge Summary  Patient ID: Miyoko Hashimi MRN: [id number] DOB/AGE: [birth year] 61 y.o.  Admit date: 03/25/2017 Discharge date: 03/28/2017   Procedures:  Procedure(s) (LRB): LEFT TOTAL HIP ARTHROPLASTY ANTERIOR APPROACH (Left)  Attending Physician:  Dr. Paralee Cancel   Admission Diagnoses:   Left hip AVN / pain  Discharge Diagnoses:  Principal Problem:   S/P left THA, AA  Past Medical History:  Diagnosis Date  . Anxiety   . Arthritis   . Asthma    childhood   . Cancer [facility])    colorectal cancer with chemo and radiation   . DVT (deep venous thrombosis) ([place])   . Dyspnea    with anxiety and exertion  . Neuromuscular disorder (H[facility])    poly neuropathy   . Stroke [facility]) 2010, 2011   TIA  x2    HPI:    [name], 61 y.o. female, has a history of pain and functional disability in the left hip(s) due to arthritis and patient has failed non-surgical conservative treatments for greater than 12 weeks to include NSAID's and/or analgesics, use of assistive devices and activity modification.  Onset of symptoms was gradual starting 1+ years ago with gradually worsening course since that time.The patient noted no past surgery on the left hip(s).  Patient currently rates pain in the left hip at 10 out of 10 with activity. Patient has worsening of pain with activity and weight bearing, trendelenberg gait, pain that interfers with activities of daily living and pain with passive range of motion. Patient has evidence of periarticular osteophytes, joint space narrowing and AVN by imaging studies. This condition presents safety issues increasing the risk of falls.  There is no current active infection.   Risks, benefits and expectations were discussed with the patient.  Risks including but not limited to the risk of anesthesia, blood clots, nerve damage, blood vessel damage, failure of the prosthesis, infection and up to and including death.  Patient understand the risks,  benefits and expectations and wishes to proceed with surgery.   PCP: Damaris Hippo, MD   Discharged Condition: good  Hospital Course:  Patient underwent the above stated procedure on 03/25/2017. Patient tolerated the procedure well and brought to the recovery room in good condition and subsequently to the floor.  POD #1 BP: 107/56 ; Pulse: 94 ; Temp: 97.6 F (36.4 C) ; Resp: 16 Patient reports pain as moderate, pain controlled.  No events throughout the night.  Patient has been not using the left leg for about a year.  Will need to work with PT for ambulation safety as well as strengthening.  LABS  Basename    HGB     8.4  HCT     24.5   POD #2  BP: 113/60 ; Pulse: 88 ; Temp: 98 F (36.7 C) ; Resp: 18 Patient reports pain as mild, controlled. No events throughout the night.  Concerns that she didn't ambulate much.  We have reviewed that she was not walking much prior to surgery.  She feels that some of her issues with walking are associated with a bit of anxiety.  Will allow her to work with PT again today to try to help alleviate some of her anxiety.  To add to the anxiety she was told that a very close uncle died.  Do to her hgb being low and being symptomic we transfused 2 units of blood. Dorsiflexion/plantar flexion intact, incision: dressing C/D/I, no cellulitis present and compartment soft.   LABS  Basename    HGB     7.1  HCT     20.3   POD #3  BP: 128/58 ; Pulse: 88 ; Temp: 98.7 F (37.1 C) ; Resp: 15 Patient reports pain as mild, pain controlled. Patient states that she doesn't really want to use the word pain as it is almost nothing when compared to prior to surgery and the year she has been living in pain.  She is progressing well and labs look better today.  Unfortunately she has a death of an Uncle with whom she was very close. I had a discussion with the patient's daughter with what to expect and how the patient is doing.  Ready to be discharged home.  Dorsiflexion/plantar  flexion intact, incision: dressing C/D/I, no cellulitis present and compartment soft.   LABS  Basename    HGB     9.8  HCT     28.2    Discharge Exam: General appearance: alert, cooperative and no distress Extremities: Homans sign is negative, no sign of DVT, no edema, redness or tenderness in the calves or thighs and no ulcers, gangrene or trophic changes  Disposition: Home with follow up in 2 weeks   Follow-up Information    Mauri Pole, MD. Schedule an appointment as soon as possible for a visit in 2 week(s).   Specialty:  Orthopedic Surgery Contact information: [address] [place] [address] [phone]        [facility] Follow up.   Specialty:  [facility] Contact information: [address] [place] [address] [phone]           Discharge Instructions    Call MD / Call 911    Complete by:  As directed    If you experience chest pain or shortness of breath, CALL 911 and be transported to the hospital emergency room.  If you develope a fever above 101 F, pus (white drainage) or increased drainage or redness at the wound, or calf pain, call your surgeon's office.   Call MD / Call 911    Complete by:  As directed    If you experience chest pain or shortness of breath, CALL 911 and be transported to the hospital emergency room.  If you develope a fever above 101 F, pus (white drainage) or increased drainage or redness at the wound, or calf pain, call your surgeon's office.   Change dressing    Complete by:  As directed    Maintain surgical dressing until follow up in the clinic. If the edges start to pull up, may reinforce with tape. If the dressing is no longer working, may remove and cover with gauze and tape, but must keep the area dry and clean.  Call with any questions or concerns.   Change dressing    Complete by:  As directed    Maintain surgical dressing until follow up in the clinic. If the edges start  to pull up, may reinforce with tape. If the dressing is no longer working, may remove and cover with gauze and tape, but must keep the area dry and clean.  Call with any questions or concerns.   Constipation Prevention    Complete by:  As directed    Drink plenty of fluids.  Prune juice may be helpful.  You may use a stool softener, such as Colace (over the counter) 100 mg twice a day.  Use MiraLax (over the counter) for constipation  as needed.   Constipation Prevention    Complete by:  As directed    Drink plenty of fluids.  Prune juice may be helpful.  You may use a stool softener, such as Colace (over the counter) 100 mg twice a day.  Use MiraLax (over the counter) for constipation as needed.   Diet - low sodium heart healthy    Complete by:  As directed    Diet - low sodium heart healthy    Complete by:  As directed    Discharge instructions    Complete by:  As directed    Maintain surgical dressing until follow up in the clinic. If the edges start to pull up, may reinforce with tape. If the dressing is no longer working, may remove and cover with gauze and tape, but must keep the area dry and clean.  Follow up in 2 weeks at [facility]. Call with any questions or concerns.   Discharge instructions    Complete by:  As directed    Maintain surgical dressing until follow up in the clinic. If the edges start to pull up, may reinforce with tape. If the dressing is no longer working, may remove and cover with gauze and tape, but must keep the area dry and clean.  Follow up in 2 weeks at [facility]. Call with any questions or concerns.   Increase activity slowly as tolerated    Complete by:  As directed    Weight bearing as tolerated with assist device (walker, cane, etc) as directed, use it as long as suggested by your surgeon or therapist, typically at least 4-6 weeks.   Increase activity slowly as tolerated    Complete by:  As directed    Weight bearing as tolerated  with assist device (walker, cane, etc) as directed, use it as long as suggested by your surgeon or therapist, typically at least 4-6 weeks.   TED hose    Complete by:  As directed    Use stockings (TED hose) for 2 weeks on both leg(s).  You may remove them at night for sleeping.   TED hose    Complete by:  As directed    Use stockings (TED hose) for 2 weeks on both leg(s).  You may remove them at night for sleeping.      Allergies as of 03/28/2017      Reactions   Penicillins Itching   Has patient had a PCN reaction causing immediate rash, facial/tongue/throat swelling, SOB or lightheadedness with hypotension: Unknown Has patient had a PCN reaction causing severe rash involving mucus membranes or skin necrosis: Unknown Has patient had a PCN reaction that required hospitalization Unknown Has patient had a PCN reaction occurring within the last 10 years: Unknown Daughter states pt cant take any derivative of penicillin either      Medication List    STOP taking these medications   orphenadrine 100 MG tablet Commonly known as:  NORFLEX   sennosides-docusate sodium 8.6-50 MG tablet Commonly known as:  SENOKOT-S   traMADol 50 MG tablet Commonly known as:  ULTRAM     TAKE these medications   amitriptyline 50 MG tablet Commonly known as:  ELAVIL Take 50 mg by mouth at bedtime.   AZO URINARY TRACT DEFENSE PO Take 2 tablets by mouth daily as needed (uti symptoms).   CRANBERRY-VITAMIN C PO Take 2 tablets by mouth daily.   docusate sodium 100 MG capsule Commonly known as:  COLACE Take 1 capsule (100  mg total) by mouth 2 (two) times daily.   doxycycline 100 MG capsule Commonly known as:  VIBRAMYCIN Take 1 capsule (100 mg total) by mouth 2 (two) times daily.   ferrous sulfate 325 (65 FE) MG tablet Commonly known as:  FERROUSUL Take 1 tablet (325 mg total) by mouth 3 (three) times daily with meals.   HYDROcodone-acetaminophen 7.5-325 MG tablet Commonly known as:  NORCO Take  1-2 tablets by mouth every 4 (four) hours as needed for moderate pain or severe pain.   methocarbamol 500 MG tablet Commonly known as:  ROBAXIN Take 1 tablet (500 mg total) by mouth every 6 (six) hours as needed for muscle spasms.   omeprazole 20 MG capsule Commonly known as:  PRILOSEC Take 20 mg by mouth daily.   polyethylene glycol packet Commonly known as:  MIRALAX / GLYCOLAX Take 17 g by mouth 2 (two) times daily. What changed:  when to take this  reasons to take this   rivaroxaban 20 MG Tabs tablet Commonly known as:  XARELTO Take 1 tablet (20 mg total) by mouth daily with supper. Please follow up with PCP to continue this medication. What changed:  medication strength  how much to take  when to take this  additional instructions        Signed: West Pugh. Erryn Dickison   PA-C  03/31/2017, 9:36 AM

## 2017-04-16 ENCOUNTER — Inpatient Hospital Stay (HOSPITAL_COMMUNITY)
Admission: EM | Admit: 2017-04-16 | Discharge: 2017-04-23 | DRG: 356 | Disposition: A | Discharge status: Home Health | Payer: Federal, State, Local not specified - PPO | Attending: Family Medicine | Admitting: Family Medicine

## 2017-04-16 ENCOUNTER — Emergency Department (HOSPITAL_COMMUNITY): Payer: Federal, State, Local not specified - PPO

## 2017-04-16 ENCOUNTER — Encounter (HOSPITAL_COMMUNITY): Payer: Self-pay | Admitting: Emergency Medicine

## 2017-04-16 DIAGNOSIS — C210 Malignant neoplasm of anus, unspecified: Secondary | ICD-10-CM | POA: Diagnosis present

## 2017-04-16 DIAGNOSIS — Z8673 Personal history of transient ischemic attack (TIA), and cerebral infarction without residual deficits: Secondary | ICD-10-CM

## 2017-04-16 DIAGNOSIS — C772 Secondary and unspecified malignant neoplasm of intra-abdominal lymph nodes: Secondary | ICD-10-CM | POA: Diagnosis present

## 2017-04-16 DIAGNOSIS — C7951 Secondary malignant neoplasm of bone: Secondary | ICD-10-CM

## 2017-04-16 DIAGNOSIS — E669 Obesity, unspecified: Secondary | ICD-10-CM | POA: Diagnosis present

## 2017-04-16 DIAGNOSIS — C20 Malignant neoplasm of rectum: Secondary | ICD-10-CM

## 2017-04-16 DIAGNOSIS — R630 Anorexia: Secondary | ICD-10-CM | POA: Diagnosis present

## 2017-04-16 DIAGNOSIS — Z87891 Personal history of nicotine dependence: Secondary | ICD-10-CM

## 2017-04-16 DIAGNOSIS — E860 Dehydration: Secondary | ICD-10-CM | POA: Diagnosis present

## 2017-04-16 DIAGNOSIS — J45909 Unspecified asthma, uncomplicated: Secondary | ICD-10-CM | POA: Diagnosis present

## 2017-04-16 DIAGNOSIS — R112 Nausea with vomiting, unspecified: Secondary | ICD-10-CM

## 2017-04-16 DIAGNOSIS — Z6828 Body mass index (BMI) 28.0-28.9, adult: Secondary | ICD-10-CM

## 2017-04-16 DIAGNOSIS — Z85048 Personal history of other malignant neoplasm of rectum, rectosigmoid junction, and anus: Secondary | ICD-10-CM

## 2017-04-16 DIAGNOSIS — K3184 Gastroparesis: Secondary | ICD-10-CM | POA: Diagnosis not present

## 2017-04-16 DIAGNOSIS — G9341 Metabolic encephalopathy: Secondary | ICD-10-CM | POA: Diagnosis present

## 2017-04-16 DIAGNOSIS — F410 Panic disorder [episodic paroxysmal anxiety] without agoraphobia: Secondary | ICD-10-CM | POA: Diagnosis present

## 2017-04-16 DIAGNOSIS — K625 Hemorrhage of anus and rectum: Secondary | ICD-10-CM | POA: Diagnosis present

## 2017-04-16 DIAGNOSIS — G43A1 Cyclical vomiting, intractable: Secondary | ICD-10-CM

## 2017-04-16 DIAGNOSIS — R109 Unspecified abdominal pain: Secondary | ICD-10-CM | POA: Diagnosis present

## 2017-04-16 DIAGNOSIS — Z96642 Presence of left artificial hip joint: Secondary | ICD-10-CM | POA: Diagnosis present

## 2017-04-16 DIAGNOSIS — R634 Abnormal weight loss: Secondary | ICD-10-CM | POA: Diagnosis present

## 2017-04-16 DIAGNOSIS — I82409 Acute embolism and thrombosis of unspecified deep veins of unspecified lower extremity: Secondary | ICD-10-CM | POA: Diagnosis present

## 2017-04-16 DIAGNOSIS — R030 Elevated blood-pressure reading, without diagnosis of hypertension: Secondary | ICD-10-CM | POA: Diagnosis present

## 2017-04-16 DIAGNOSIS — Z9221 Personal history of antineoplastic chemotherapy: Secondary | ICD-10-CM

## 2017-04-16 DIAGNOSIS — Y842 Radiological procedure and radiotherapy as the cause of abnormal reaction of the patient, or of later complication, without mention of misadventure at the time of the procedure: Secondary | ICD-10-CM | POA: Diagnosis present

## 2017-04-16 DIAGNOSIS — G629 Polyneuropathy, unspecified: Secondary | ICD-10-CM | POA: Diagnosis present

## 2017-04-16 DIAGNOSIS — C189 Malignant neoplasm of colon, unspecified: Secondary | ICD-10-CM

## 2017-04-16 DIAGNOSIS — K219 Gastro-esophageal reflux disease without esophagitis: Secondary | ICD-10-CM | POA: Diagnosis present

## 2017-04-16 DIAGNOSIS — G9340 Encephalopathy, unspecified: Secondary | ICD-10-CM | POA: Diagnosis present

## 2017-04-16 DIAGNOSIS — K626 Ulcer of anus and rectum: Secondary | ICD-10-CM | POA: Diagnosis present

## 2017-04-16 DIAGNOSIS — Z923 Personal history of irradiation: Secondary | ICD-10-CM

## 2017-04-16 DIAGNOSIS — K644 Residual hemorrhoidal skin tags: Secondary | ICD-10-CM | POA: Diagnosis present

## 2017-04-16 DIAGNOSIS — Z7901 Long term (current) use of anticoagulants: Secondary | ICD-10-CM

## 2017-04-16 DIAGNOSIS — C801 Malignant (primary) neoplasm, unspecified: Secondary | ICD-10-CM | POA: Diagnosis present

## 2017-04-16 DIAGNOSIS — Z86718 Personal history of other venous thrombosis and embolism: Secondary | ICD-10-CM

## 2017-04-16 DIAGNOSIS — Z880 Allergy status to penicillin: Secondary | ICD-10-CM

## 2017-04-16 HISTORY — DX: Gastro-esophageal reflux disease without esophagitis: K21.9

## 2017-04-16 LAB — RAPID URINE DRUG SCREEN, HOSP PERFORMED
AMPHETAMINES: NOT DETECTED
BARBITURATES: NOT DETECTED
Benzodiazepines: NOT DETECTED
Cocaine: NOT DETECTED
OPIATES: POSITIVE — AB
TETRAHYDROCANNABINOL: NOT DETECTED

## 2017-04-16 LAB — COMPREHENSIVE METABOLIC PANEL
ALBUMIN: 4 g/dL (ref 3.5–5.0)
ALK PHOS: 102 U/L (ref 38–126)
ALT: 9 U/L — AB (ref 14–54)
ANION GAP: 10 (ref 5–15)
AST: 18 U/L (ref 15–41)
BILIRUBIN TOTAL: 0.4 mg/dL (ref 0.3–1.2)
BUN: 5 mg/dL — ABNORMAL LOW (ref 6–20)
CALCIUM: 9.3 mg/dL (ref 8.9–10.3)
CO2: 23 mmol/L (ref 22–32)
Chloride: 106 mmol/L (ref 101–111)
Creatinine, Ser: 0.57 mg/dL (ref 0.44–1.00)
GFR calc non Af Amer: >60 mL/min (ref >60)
GLUCOSE: 149 mg/dL — AB (ref 65–99)
Potassium: 3.5 mmol/L (ref 3.5–5.1)
Sodium: 139 mmol/L (ref 135–145)
TOTAL PROTEIN: 7.9 g/dL (ref 6.5–8.1)

## 2017-04-16 LAB — URINALYSIS, ROUTINE W REFLEX MICROSCOPIC
Bacteria, UA: NONE SEEN
Bilirubin Urine: NEGATIVE
Glucose, UA: NEGATIVE mg/dL
Ketones, ur: 20 mg/dL — AB
Leukocytes, UA: NEGATIVE
Nitrite: NEGATIVE
Protein, ur: NEGATIVE mg/dL
Specific Gravity, Urine: 1.009 (ref 1.005–1.030)
pH: 8 (ref 5.0–8.0)

## 2017-04-16 LAB — CBC WITH DIFFERENTIAL/PLATELET
Basophils Absolute: 0 10*3/uL (ref 0.0–0.1)
Basophils Relative: 0 %
Eosinophils Absolute: 0 10*3/uL (ref 0.0–0.7)
Eosinophils Relative: 0 %
HEMATOCRIT: 33.7 % — AB (ref 36.0–46.0)
HEMOGLOBIN: 11.4 g/dL — AB (ref 12.0–15.0)
LYMPHS ABS: 0.8 10*3/uL (ref 0.7–4.0)
Lymphocytes Relative: 9 %
MCH: 27 pg (ref 26.0–34.0)
MCHC: 33.8 g/dL (ref 30.0–36.0)
MCV: 79.7 fL (ref 78.0–100.0)
MONOS PCT: 4 %
Monocytes Absolute: 0.4 10*3/uL (ref 0.1–1.0)
NEUTROS ABS: 8.4 10*3/uL — AB (ref 1.7–7.7)
NEUTROS PCT: 87 %
Platelets: 470 10*3/uL — ABNORMAL HIGH (ref 150–400)
RBC: 4.23 MIL/uL (ref 3.87–5.11)
RDW: 18.9 % — ABNORMAL HIGH (ref 11.5–15.5)
WBC: 9.6 10*3/uL (ref 4.0–10.5)

## 2017-04-16 LAB — I-STAT TROPONIN, ED: Troponin i, poc: 0 ng/mL (ref 0.00–0.08)

## 2017-04-16 LAB — LIPASE, BLOOD: Lipase: 17 U/L (ref 11–51)

## 2017-04-16 MED ORDER — SODIUM CHLORIDE 0.9 % IV BOLUS (SEPSIS)
1000.0000 mL | Freq: Once | INTRAVENOUS | Status: AC
  Administered 2017-04-16: 1000 mL via INTRAVENOUS

## 2017-04-16 MED ORDER — NALOXONE HCL 0.4 MG/ML IJ SOLN
0.4000 mg | Freq: Once | INTRAMUSCULAR | Status: AC
  Administered 2017-04-17: 0.4 mg via INTRAVENOUS
  Filled 2017-04-16: qty 1

## 2017-04-16 MED ORDER — IOPAMIDOL (ISOVUE-300) INJECTION 61%
INTRAVENOUS | Status: AC
  Administered 2017-04-17: 100 mL
  Filled 2017-04-16: qty 100

## 2017-04-16 MED ORDER — ONDANSETRON HCL 4 MG/2ML IJ SOLN
4.0000 mg | Freq: Once | INTRAMUSCULAR | Status: AC | PRN
  Administered 2017-04-16: 4 mg via INTRAVENOUS
  Filled 2017-04-16: qty 2

## 2017-04-16 NOTE — ED Provider Notes (Signed)
[name]   CSN: [id number] Arrival date & time: 04/16/17  2054  By signing my name below, I, [name], [name], [name], Scribe. 04/16/17. 12:00 AM.  History   Chief Complaint Chief Complaint  Patient presents with  . Nausea  . Abdominal Pain   HPI Comments: [name] is a 61 y.o. female with a history of DVT on xarelto, colon cancer with chemo and radiation, and a stroke x2, who presents to the Emergency Department complaining of sudden-onset, intermittent emesis that began at 2:30 PM today. Per friend bedside the patient has not been normal for the past few day. The patient has had a similar episode in the past, in which the patient was noted to be altered. Patient is currently on antibiotics and multiple narcotics. Patient reports associated nausea, hard stool, and abdominal pain. Patient ate strawberries, almond milk, and soup earlier today. No missed doses of xarelto. Patient denies any fever, chills, diarrhea, or any other symptoms.   Per friend bedside the patient has been having "panic attacks". Patient has had similar episodes in the past. Per nurse the patient is altered, has been screaming "I cannot be in this room". Narcan given. Patient denies any other symptoms.    The history is provided by the patient. No language interpreter was used.  Abdominal Pain   This is a recurrent problem. The current episode started 6 to 12 hours ago. The problem occurs constantly. The problem has not changed since onset.Associated with: medicaiton. The pain is located in the generalized abdominal region. The pain is at a severity of 10/10. The patient is experiencing no pain. Associated symptoms include nausea and vomiting. Pertinent negatives include fever and diarrhea. Nothing aggravates the symptoms. Nothing relieves the  symptoms. Past workup does not include GI consult. Her past medical history does not include GERD or irritable bowel syndrome.  Emesis   This is a recurrent problem. The current episode started 12 to 24 hours ago. There has been no fever. Associated symptoms include abdominal pain. Pertinent negatives include no chills, no cough, no diarrhea and no fever.    Past Medical History:  Diagnosis Date  . Anxiety   . Arthritis   . Asthma    childhood   . Cancer [facility])    colorectal cancer with chemo and radiation   . DVT (deep venous thrombosis) ([place])   . Dyspnea    with anxiety and exertion  . Neuromuscular disorder (H[facility])    poly neuropathy   . Stroke [facility]) 2010, 2011   TIA  x2    Patient Active Problem List   Diagnosis Date Noted  . 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 CESAREAN SECTION    . THYROID CYST EXCISION    . TOTAL HIP ARTHROPLASTY Left 03/25/2017   Procedure: LEFT TOTAL HIP ARTHROPLASTY ANTERIOR APPROACH;  Surgeon: Paralee Cancel, MD;  Location: [facility] ORS;  Service: Orthopedics;  Laterality: Left;  Requests 70 mins    OB History    No data available       Home Medications    Prior to Admission medications   Medication Sig Start Date End Date Taking? Authorizing Provider  bismuth subsalicylate (PEPTO BISMOL) 262 MG chewable tablet Chew 524 mg by mouth daily as needed for indigestion or diarrhea or loose stools.   Yes  Historical Provider, MD  docusate sodium (COLACE) 100 MG capsule Take 1 capsule (100 mg total) by mouth 2 (two) times daily. 03/25/17  Yes Danae Orleans, PA-C  doxycycline (VIBRAMYCIN) 100 MG capsule Take 1 capsule (100 mg total) by mouth 2 (two) times daily. 03/27/17  Yes Matthew Babish, PA-C  Efinaconazole (JUBLIA) 10 % SOLN Apply 1 application topically daily.   Yes Historical Provider, MD  ferrous sulfate (FERROUSUL) 325 (65 FE) MG tablet Take 1 tablet (325 mg total) by mouth 3 (three) times daily with meals.  03/25/17  Yes Danae Orleans, PA-C  HYDROcodone-acetaminophen (NORCO/VICODIN) 5-325 MG tablet TK 1-2 TS PO Q 6 H PRN P 04/11/17  Yes Historical Provider, MD  methocarbamol (ROBAXIN) 500 MG tablet Take 1 tablet (500 mg total) by mouth every 6 (six) hours as needed for muscle spasms. 03/25/17  Yes Danae Orleans, PA-C  omeprazole (PRILOSEC) 20 MG capsule Take 20 mg by mouth daily.   Yes Historical Provider, MD  ondansetron (ZOFRAN) 4 MG tablet TK 1 T PO Q 6 H PRF NAUSEA 04/15/17  Yes Historical Provider, MD  polyethylene glycol (MIRALAX / GLYCOLAX) packet Take 17 g by mouth 2 (two) times daily. 03/25/17  Yes Danae Orleans, PA-C  rivaroxaban (XARELTO) 20 MG TABS tablet Take 1 tablet (20 mg total) by mouth daily with supper. Please follow up with PCP to continue this medication. 03/27/17  Yes Matthew Babish, PA-C  saccharomyces boulardii (FLORASTOR) 250 MG capsule Take 250 mg by mouth daily.   Yes Historical Provider, MD  HYDROcodone-acetaminophen (NORCO) 7.5-325 MG tablet Take 1-2 tablets by mouth every 4 (four) hours as needed for moderate pain or severe pain. Patient not taking: Reported on 04/16/2017 03/25/17   Danae Orleans, PA-C  Methenamine-Sodium Salicylate (AZO URINARY TRACT DEFENSE PO) Take 2 tablets by mouth daily as needed (uti symptoms).    Historical Provider, MD    Family History No family history on file.  Social History Social History  Substance Use Topics  . Smoking status: Former Smoker    Types: Cigarettes  . Smokeless tobacco: Never Used     Comment: 20 years ago   . Alcohol use No     Allergies   Penicillins   Review of Systems Review of Systems  Constitutional: Negative for chills and fever.  Respiratory: Negative for cough.   Cardiovascular: Negative for chest pain and leg swelling.  Gastrointestinal: Positive for abdominal pain, nausea and vomiting. Negative for diarrhea.  Psychiatric/Behavioral: Positive for agitation and behavioral problems. Negative for self-injury.    All other systems reviewed and are negative.    Physical Exam Updated Vital Signs BP (!) 166/85 (BP Location: Right Arm)   Pulse 92   Resp (!) 22   SpO2 100%   Physical Exam  Constitutional: She is oriented to person, place, and time. She appears well-developed.  HENT:  Head: Normocephalic and atraumatic.  Mouth/Throat: Oropharynx is clear and moist.  Eyes: Conjunctivae and EOM are normal. Pupils are equal, round, and reactive to light.  Pin point pupils.   Neck: Normal range of motion. Neck supple.  No bruits.   Cardiovascular: Normal rate, regular rhythm, normal heart sounds and intact distal pulses.   Pulmonary/Chest: Effort normal and breath sounds normal. No stridor. No respiratory distress. She has no wheezes. She has no rales.  Abdominal: Soft. Bowel sounds are normal. She exhibits no mass. There is no tenderness. There is no guarding.  Musculoskeletal: Normal range of motion. She exhibits no tenderness or deformity.  Lymphadenopathy:    She has no cervical adenopathy.  Neurological: She is alert and oriented to person, place, and time. She displays normal reflexes.  Intact distal pulses.   Skin: Skin is warm and dry. Capillary refill takes less than 2 seconds.  Psychiatric: She has a normal mood and affect.  Nursing Huber and vitals reviewed.    ED Treatments / Results  DIAGNOSTIC STUDIES: Oxygen Saturation is 100% on RA, normal by my interpretation.    COORDINATION OF CARE: 11:50 PM Discussed treatment plan with pt at bedside and pt agreed to plan, which includes tylenol at a slight level on EDS.   Labs (all labs ordered are listed, but only abnormal results are displayed)  Results for orders placed or performed during the hospital encounter of 04/16/17  Urinalysis, Routine w reflex microscopic  Result Value Ref Range   Color, Urine YELLOW YELLOW   APPearance CLOUDY (A) CLEAR   Specific Gravity, Urine 1.009 1.005 - 1.030   pH 8.0 5.0 - 8.0   Glucose, UA  NEGATIVE NEGATIVE mg/dL   Hgb urine dipstick SMALL (A) NEGATIVE   Bilirubin Urine NEGATIVE NEGATIVE   Ketones, ur 20 (A) NEGATIVE mg/dL   Protein, ur NEGATIVE NEGATIVE mg/dL   Nitrite NEGATIVE NEGATIVE   Leukocytes, UA NEGATIVE NEGATIVE   RBC / HPF 0-5 0 - 5 RBC/hpf   WBC, UA 0-5 0 - 5 WBC/hpf   Bacteria, UA NONE SEEN NONE SEEN   Squamous Epithelial / LPF 0-5 (A) NONE SEEN   Mucous PRESENT   CBC with Differential  Result Value Ref Range   WBC 9.6 4.0 - 10.5 K/uL   RBC 4.23 3.87 - 5.11 MIL/uL   Hemoglobin 11.4 (L) 12.0 - 15.0 g/dL   HCT 33.7 (L) 36.0 - 46.0 %   MCV 79.7 78.0 - 100.0 fL   MCH 27.0 26.0 - 34.0 pg   MCHC 33.8 30.0 - 36.0 g/dL   RDW 18.9 (H) 11.5 - 15.5 %   Platelets 470 (H) 150 - 400 K/uL   Neutrophils Relative % 87 %   Neutro Abs 8.4 (H) 1.7 - 7.7 K/uL   Lymphocytes Relative 9 %   Lymphs Abs 0.8 0.7 - 4.0 K/uL   Monocytes Relative 4 %   Monocytes Absolute 0.4 0.1 - 1.0 K/uL   Eosinophils Relative 0 %   Eosinophils Absolute 0.0 0.0 - 0.7 K/uL   Basophils Relative 0 %   Basophils Absolute 0.0 0.0 - 0.1 K/uL  Comprehensive metabolic panel  Result Value Ref Range   Sodium 139 135 - 145 mmol/L   Potassium 3.5 3.5 - 5.1 mmol/L   Chloride 106 101 - 111 mmol/L   CO2 23 22 - 32 mmol/L   Glucose, Bld 149 (H) 65 - 99 mg/dL   BUN 5 (L) 6 - 20 mg/dL   Creatinine, Ser 0.57 0.44 - 1.00 mg/dL   Calcium 9.3 8.9 - 10.3 mg/dL   Total Protein 7.9 6.5 - 8.1 g/dL   Albumin 4.0 3.5 - 5.0 g/dL   AST 18 15 - 41 U/L   ALT 9 (L) 14 - 54 U/L   Alkaline Phosphatase 102 38 - 126 U/L   Total Bilirubin 0.4 0.3 - 1.2 mg/dL   GFR calc non Af Amer >60 >60 mL/min   GFR calc Af Amer >60 >60 mL/min   Anion gap 10 5 - 15  Lipase, blood  Result Value Ref Range   Lipase 17 11 - 51 U/L  Rapid  urine drug screen (hospital performed)  Result Value Ref Range   Opiates POSITIVE (A) NONE DETECTED   Cocaine NONE DETECTED NONE DETECTED   Benzodiazepines NONE DETECTED NONE DETECTED    Amphetamines NONE DETECTED NONE DETECTED   Tetrahydrocannabinol NONE DETECTED NONE DETECTED   Barbiturates NONE DETECTED NONE DETECTED  Acetaminophen level  Result Value Ref Range   Acetaminophen (Tylenol), Serum <10 (L) 10 - 30 ug/mL  Salicylate level  Result Value Ref Range   Salicylate Lvl <3.2 2.8 - 30.0 mg/dL  I-stat troponin, ED  Result Value Ref Range   Troponin i, poc 0.00 0.00 - 0.08 ng/mL   Comment 3           Ct Head Wo Contrast  Result Date: 04/17/2017 CLINICAL DATA:  Altered mental status, panic attacks, colorectal cancer with chemotherapy and radiation. EXAM: CT HEAD WITHOUT CONTRAST TECHNIQUE: Contiguous axial images were obtained from the base of the skull through the vertex without intravenous contrast. COMPARISON:  None. FINDINGS: Brain: No evidence of acute infarction, hemorrhage, hydrocephalus, extra-axial collection or mass lesion/mass effect. Mild superficial atrophy. Vascular: No hyperdense vessel or unexpected calcification. Skull: Normal. Negative for fracture or focal lesion. Sinuses/Orbits: No acute finding. Other: The patient is slightly tilted on this exam. IMPRESSION: No acute intracranial abnormality.  Mild superficial atrophy. Electronically Signed   By: Ashley Royalty M.D.   On: 04/17/2017 01:44   Ct Abdomen Pelvis W Contrast  Result Date: 04/17/2017 CLINICAL DATA:  61 year old female with nausea and vomiting. EXAM: CT ABDOMEN AND PELVIS WITH CONTRAST TECHNIQUE: Multidetector CT imaging of the abdomen and pelvis was performed using the standard protocol following bolus administration of intravenous contrast. CONTRAST:  100 cc Isovue-300 COMPARISON:  None. FINDINGS: Lower chest: The visualized lung bases are clear. No intra-abdominal free air or free fluid. Hepatobiliary: A subcentimeter hypodense lesion in the right lobe of the liver as well as a 13 mm low attenuating lesion in the left lobe of the liver are not well characterized on this CT. MRI may provide better  characterisation. There is no intrahepatic biliary ductal dilatation. The gallbladder is unremarkable. Pancreas: Unremarkable. No pancreatic ductal dilatation or surrounding inflammatory changes. Spleen: Normal in size without focal abnormality. Adrenals/Urinary Tract: The adrenal glands are unremarkable. There is a 3 cm on left renal parapelvic cyst. A small exophytic cyst is noted from the inferior pole of the left kidney. Multiple small parenchymal calcific foci noted in the upper pole of the left kidney. There is no hydronephrosis on either side. There is symmetric uptake and excretion of contrast by kidneys. The visualized ureters and urinary bladder appear unremarkable. Stomach/Bowel: There is a 2.1 x 2.5 x 3.2 cm somewhat lobulated left posterior rectal mass. There is loss of fat plane between this lesion and the rectal wall. This may represent a neoplasm arising from the rectum or an abnormal/ neoplastic lymph node. An 11 x 13 mm left perirectal rounded lesion more superiorly is also noted which may represent extension of the larger mass or a mildly enlarged lymph node. Pelvic MRI is recommended for further characterization. Oral contrast mixed with stool noted throughout the colon. There is no evidence of bowel obstruction or active inflammation. Normal appendix. There is probable duodenal diverticulum at the head of the pancreas. Vascular/Lymphatic: There is minimal aortoiliac atherosclerotic disease. The abdominal aorta and IVC are otherwise unremarkable. No portal venous gas identified. Multiple retroperitoneal adenopathy noted. A cluster of adjacent left para-aortic lymph node measure 4.2 x 3.0 x 5.5 cm.  Reproductive: The uterus and ovaries are grossly unremarkable as visualized. Other: There is a 3.7 x 1.2 x 1.7 cm low attenuating lesion to the left of the in L5 vertebra posterior to the psoas muscle consistent with metastatic disease. Musculoskeletal: Multilevel degenerative changes of the spine with  osteophyte formation. The bones are osteopenic. There is degenerative changes with disc space narrowing at L5-S1. No acute fracture. There is a total left hip arthroplasty. IMPRESSION: 1. Left posterior perirectal lobulated mass concerning for rectal malignancy. A smaller nodular density superior to this mass likely represent a mildly enlarged metastatic node. MRI is recommended for further evaluation. 2. 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 A hypodense lesion to the left of the L5 posterior to the psoas muscle is also consistent with metastatic disease. 3. Multiple hepatic hypodense lesions, incompletely characterized, possibly metastatic. MRI is recommended for further evaluation. 4. No evidence of bowel obstruction or active inflammation. Normal appendix. Electronically Signed   By: Anner Crete M.D.   On: 04/17/2017 02:04   Dg C-arm 61-120 Min-no Report  Result Date: 03/25/2017 Fluoroscopy was utilized by the requesting physician.  No radiographic interpretation.   Dg Hip Port Unilat With Pelvis 1v Left  Result Date: 03/25/2017 CLINICAL DATA:  Status post left total hip replacement. EXAM: DG HIP (WITH OR WITHOUT PELVIS) 1V PORT LEFT COMPARISON:  None. FINDINGS: Left total hip prosthesis in satisfactory position and alignment. There is fragmentation of the superior aspect of the greater trochanter. Diffuse osteopenia. IMPRESSION: Satisfactory appearance of a left total hip prosthesis with fragmentation of the superior aspect of the left greater trochanter. Electronically Signed   By: Claudie Revering M.D.   On: 03/25/2017 10:57    EKG  EKG Interpretation  Date/Time:  Wednesday [name] 25 2018 22:02:35 EDT Ventricular Rate:  70 PR Interval:    QRS Duration: 97 QT Interval:  428 QTC Calculation: 462 R Axis:   70 Text Interpretation:  Sinus rhythm Confirmed by [facility]  MD, Eythan Jayne ([id number]) on 04/16/2017 11:18:58 PM        Radiology No results found.  Procedures Procedures (including critical care time)  Medications Ordered in ED  Medications  hydrALAZINE (APRESOLINE) injection 5 mg (not administered)  bismuth subsalicylate (PEPTO BISMOL) chewable tablet 524 mg (not administered)  Efinaconazole 10 % SOLN 1 application (not administered)  saccharomyces boulardii (FLORASTOR) capsule 250 mg (not administered)  docusate sodium (COLACE) capsule 100 mg (not administered)  doxycycline (VIBRA-TABS) tablet 100 mg (not administered)  ferrous sulfate tablet 325 mg (not administered)  URELLE (URELLE/URISED) 81 MG tablet 81 mg (not administered)  pantoprazole (PROTONIX) EC tablet 40 mg (not administered)  polyethylene glycol (MIRALAX / GLYCOLAX) packet 17 g (not administered)  rivaroxaban (XARELTO) tablet 20 mg (not administered)  morphine 4 MG/ML injection 1 mg (not administered)  ondansetron (ZOFRAN) injection 4 mg (not administered)  0.9 %  sodium chloride infusion (not administered)  sodium chloride flush (NS) 0.9 % injection 3 mL (not administered)  acetaminophen (TYLENOL) tablet 650 mg (not administered)    Or  acetaminophen (TYLENOL) suppository 650 mg (not administered)  zolpidem (AMBIEN) tablet 5 mg (not administered)  albuterol (PROVENTIL) (2.5 MG/3ML) 0.083% nebulizer solution 2.5 mg (not administered)  sodium chloride 0.9 % bolus 1,000 mL (0 mLs Intravenous Stopped 04/17/17 0006)  ondansetron (ZOFRAN) injection 4 mg (4 mg Intravenous Given 04/16/17 2221)  naloxone (NARCAN) injection 0.4 mg (0.4 mg Intravenous Given 04/17/17 0005)  iopamidol (ISOVUE-300) 61 % injection (100 mLs  Contrast Given 04/17/17 0135)  haloperidol lactate (HALDOL) injection 2 mg (2 mg Intravenous Given 04/17/17 0045)    Final Clinical Impressions(s) / ED Diagnoses  Metastatic cancer:  Admit to medicine for recurrence of cancer.  Given behavioral issues will need psych consult.     I personally performed the services  described in this documentation, which was scribed in my presence. The recorded information has been reviewed and is accurate.      Veatrice Kells, MD 04/17/17 [phone]

## 2017-04-16 NOTE — ED Triage Notes (Signed)
Per EMS, pt coming from home complaining of nausea and vomiting that began around 2:30pm today. Pt has zofran at home. Reports bowel movement today but states it was small and very hard. Pt recently had left hip replacement, taking doxy and hydrocodone. Pt having moments of "catatonic" activity then begins screaming and "crying". CBG upon arrival 130. Possible psych eval needed.

## 2017-04-16 NOTE — ED Notes (Signed)
Bed: GQ91 Expected date:  Expected time:  Means of arrival:  Comments: EMS 61 yo female recent hip surgery-nausea and vomiting

## 2017-04-17 ENCOUNTER — Encounter (HOSPITAL_COMMUNITY): Payer: Self-pay | Admitting: Emergency Medicine

## 2017-04-17 ENCOUNTER — Emergency Department (HOSPITAL_COMMUNITY): Payer: Federal, State, Local not specified - PPO

## 2017-04-17 ENCOUNTER — Other Ambulatory Visit (HOSPITAL_COMMUNITY): Payer: Federal, State, Local not specified - PPO

## 2017-04-17 ENCOUNTER — Encounter (HOSPITAL_COMMUNITY)
Admission: EM | Disposition: A | Discharge status: Home Health | Payer: Self-pay | Source: Home / Self Care | Attending: Internal Medicine

## 2017-04-17 DIAGNOSIS — G9340 Encephalopathy, unspecified: Secondary | ICD-10-CM | POA: Diagnosis not present

## 2017-04-17 DIAGNOSIS — R599 Enlarged lymph nodes, unspecified: Secondary | ICD-10-CM

## 2017-04-17 DIAGNOSIS — K626 Ulcer of anus and rectum: Secondary | ICD-10-CM | POA: Diagnosis present

## 2017-04-17 DIAGNOSIS — R109 Unspecified abdominal pain: Secondary | ICD-10-CM

## 2017-04-17 DIAGNOSIS — Z96642 Presence of left artificial hip joint: Secondary | ICD-10-CM | POA: Diagnosis present

## 2017-04-17 DIAGNOSIS — G9341 Metabolic encephalopathy: Secondary | ICD-10-CM | POA: Diagnosis present

## 2017-04-17 DIAGNOSIS — Z9221 Personal history of antineoplastic chemotherapy: Secondary | ICD-10-CM | POA: Diagnosis not present

## 2017-04-17 DIAGNOSIS — J45909 Unspecified asthma, uncomplicated: Secondary | ICD-10-CM | POA: Diagnosis present

## 2017-04-17 DIAGNOSIS — K219 Gastro-esophageal reflux disease without esophagitis: Secondary | ICD-10-CM | POA: Diagnosis present

## 2017-04-17 DIAGNOSIS — G629 Polyneuropathy, unspecified: Secondary | ICD-10-CM | POA: Diagnosis present

## 2017-04-17 DIAGNOSIS — M899 Disorder of bone, unspecified: Secondary | ICD-10-CM

## 2017-04-17 DIAGNOSIS — K5900 Constipation, unspecified: Secondary | ICD-10-CM

## 2017-04-17 DIAGNOSIS — G43A1 Cyclical vomiting, intractable: Secondary | ICD-10-CM | POA: Diagnosis not present

## 2017-04-17 DIAGNOSIS — K644 Residual hemorrhoidal skin tags: Secondary | ICD-10-CM | POA: Diagnosis present

## 2017-04-17 DIAGNOSIS — K769 Liver disease, unspecified: Secondary | ICD-10-CM | POA: Diagnosis not present

## 2017-04-17 DIAGNOSIS — Z923 Personal history of irradiation: Secondary | ICD-10-CM | POA: Diagnosis not present

## 2017-04-17 DIAGNOSIS — C19 Malignant neoplasm of rectosigmoid junction: Secondary | ICD-10-CM | POA: Diagnosis not present

## 2017-04-17 DIAGNOSIS — I82409 Acute embolism and thrombosis of unspecified deep veins of unspecified lower extremity: Secondary | ICD-10-CM | POA: Diagnosis present

## 2017-04-17 DIAGNOSIS — Z87891 Personal history of nicotine dependence: Secondary | ICD-10-CM | POA: Diagnosis not present

## 2017-04-17 DIAGNOSIS — C20 Malignant neoplasm of rectum: Secondary | ICD-10-CM | POA: Diagnosis not present

## 2017-04-17 DIAGNOSIS — R1907 Generalized intra-abdominal and pelvic swelling, mass and lump: Secondary | ICD-10-CM | POA: Diagnosis not present

## 2017-04-17 DIAGNOSIS — Z880 Allergy status to penicillin: Secondary | ICD-10-CM | POA: Diagnosis not present

## 2017-04-17 DIAGNOSIS — C210 Malignant neoplasm of anus, unspecified: Secondary | ICD-10-CM | POA: Diagnosis present

## 2017-04-17 DIAGNOSIS — K625 Hemorrhage of anus and rectum: Secondary | ICD-10-CM | POA: Diagnosis present

## 2017-04-17 DIAGNOSIS — Z6828 Body mass index (BMI) 28.0-28.9, adult: Secondary | ICD-10-CM | POA: Diagnosis not present

## 2017-04-17 DIAGNOSIS — C801 Malignant (primary) neoplasm, unspecified: Secondary | ICD-10-CM | POA: Diagnosis not present

## 2017-04-17 DIAGNOSIS — Z85048 Personal history of other malignant neoplasm of rectum, rectosigmoid junction, and anus: Secondary | ICD-10-CM | POA: Diagnosis not present

## 2017-04-17 DIAGNOSIS — Z86718 Personal history of other venous thrombosis and embolism: Secondary | ICD-10-CM | POA: Diagnosis not present

## 2017-04-17 DIAGNOSIS — R634 Abnormal weight loss: Secondary | ICD-10-CM | POA: Diagnosis present

## 2017-04-17 DIAGNOSIS — R112 Nausea with vomiting, unspecified: Secondary | ICD-10-CM

## 2017-04-17 DIAGNOSIS — C189 Malignant neoplasm of colon, unspecified: Secondary | ICD-10-CM | POA: Diagnosis not present

## 2017-04-17 DIAGNOSIS — R630 Anorexia: Secondary | ICD-10-CM | POA: Diagnosis present

## 2017-04-17 DIAGNOSIS — E669 Obesity, unspecified: Secondary | ICD-10-CM | POA: Diagnosis present

## 2017-04-17 DIAGNOSIS — C7951 Secondary malignant neoplasm of bone: Secondary | ICD-10-CM | POA: Diagnosis not present

## 2017-04-17 DIAGNOSIS — Z7901 Long term (current) use of anticoagulants: Secondary | ICD-10-CM | POA: Diagnosis not present

## 2017-04-17 DIAGNOSIS — Z8673 Personal history of transient ischemic attack (TIA), and cerebral infarction without residual deficits: Secondary | ICD-10-CM | POA: Diagnosis not present

## 2017-04-17 DIAGNOSIS — K3184 Gastroparesis: Secondary | ICD-10-CM | POA: Diagnosis present

## 2017-04-17 DIAGNOSIS — R110 Nausea: Secondary | ICD-10-CM | POA: Diagnosis not present

## 2017-04-17 DIAGNOSIS — F410 Panic disorder [episodic paroxysmal anxiety] without agoraphobia: Secondary | ICD-10-CM | POA: Diagnosis present

## 2017-04-17 DIAGNOSIS — C772 Secondary and unspecified malignant neoplasm of intra-abdominal lymph nodes: Secondary | ICD-10-CM | POA: Diagnosis present

## 2017-04-17 DIAGNOSIS — R030 Elevated blood-pressure reading, without diagnosis of hypertension: Secondary | ICD-10-CM | POA: Diagnosis present

## 2017-04-17 DIAGNOSIS — Y842 Radiological procedure and radiotherapy as the cause of abnormal reaction of the patient, or of later complication, without mention of misadventure at the time of the procedure: Secondary | ICD-10-CM | POA: Diagnosis present

## 2017-04-17 DIAGNOSIS — E860 Dehydration: Secondary | ICD-10-CM | POA: Diagnosis present

## 2017-04-17 HISTORY — PX: FLEXIBLE SIGMOIDOSCOPY: SHX5431

## 2017-04-17 LAB — BASIC METABOLIC PANEL
Anion gap: 12 (ref 5–15)
CALCIUM: 9.3 mg/dL (ref 8.9–10.3)
CO2: 23 mmol/L (ref 22–32)
CREATININE: 0.49 mg/dL (ref 0.44–1.00)
Chloride: 104 mmol/L (ref 101–111)
GFR calc Af Amer: >60 mL/min (ref >60)
Glucose, Bld: 135 mg/dL — ABNORMAL HIGH (ref 65–99)
Potassium: 2.8 mmol/L — ABNORMAL LOW (ref 3.5–5.1)
Sodium: 139 mmol/L (ref 135–145)

## 2017-04-17 LAB — PROTIME-INR
INR: 1.18
PROTHROMBIN TIME: 15.1 s (ref 11.4–15.2)

## 2017-04-17 LAB — CBC
HCT: 33 % — ABNORMAL LOW (ref 36.0–46.0)
HEMATOCRIT: 32.5 % — AB (ref 36.0–46.0)
Hemoglobin: 11.1 g/dL — ABNORMAL LOW (ref 12.0–15.0)
Hemoglobin: 11.2 g/dL — ABNORMAL LOW (ref 12.0–15.0)
MCH: 26.7 pg (ref 26.0–34.0)
MCH: 27.7 pg (ref 26.0–34.0)
MCHC: 33.6 g/dL (ref 30.0–36.0)
MCHC: 34.5 g/dL (ref 30.0–36.0)
MCV: 79.3 fL (ref 78.0–100.0)
MCV: 80.4 fL (ref 78.0–100.0)
PLATELETS: 484 10*3/uL — AB (ref 150–400)
Platelets: 448 10*3/uL — ABNORMAL HIGH (ref 150–400)
RBC: 4.16 MIL/uL (ref 3.87–5.11)
RBC: 4.04 MIL/uL (ref 3.87–5.11)
RDW: 18.8 % — ABNORMAL HIGH (ref 11.5–15.5)
RDW: 18.7 % — AB (ref 11.5–15.5)
WBC: 10.1 10*3/uL (ref 4.0–10.5)
WBC: 10.2 10*3/uL (ref 4.0–10.5)

## 2017-04-17 LAB — SALICYLATE LEVEL: Salicylate Lvl: <7 mg/dL (ref 2.8–30.0)

## 2017-04-17 LAB — AMMONIA: Ammonia: 11 umol/L (ref 9–35)

## 2017-04-17 LAB — GLUCOSE, CAPILLARY: Glucose-Capillary: 116 mg/dL — ABNORMAL HIGH (ref 65–99)

## 2017-04-17 LAB — TYPE AND SCREEN
ABO/RH(D): O POS
ANTIBODY SCREEN: NEGATIVE

## 2017-04-17 LAB — HIV ANTIBODY (ROUTINE TESTING W REFLEX): HIV SCREEN 4TH GENERATION: NONREACTIVE

## 2017-04-17 LAB — APTT: aPTT: 34 seconds (ref 24–36)

## 2017-04-17 LAB — ACETAMINOPHEN LEVEL: Acetaminophen (Tylenol), Serum: <10 ug/mL — ABNORMAL LOW (ref 10–30)

## 2017-04-17 SURGERY — SIGMOIDOSCOPY, FLEXIBLE
Anesthesia: Moderate Sedation

## 2017-04-17 MED ORDER — URELLE 81 MG PO TABS
1.0000 | ORAL_TABLET | Freq: Every day | ORAL | Status: DC | PRN
  Filled 2017-04-17: qty 1

## 2017-04-17 MED ORDER — ACETAMINOPHEN 325 MG PO TABS: 650.0000 mg | ORAL_TABLET | Freq: Four times a day (QID) | ORAL | Status: DC | PRN

## 2017-04-17 MED ORDER — BISMUTH SUBSALICYLATE 262 MG PO CHEW
524.0000 mg | CHEWABLE_TABLET | Freq: Every day | ORAL | Status: DC | PRN
  Filled 2017-04-17: qty 2

## 2017-04-17 MED ORDER — DOXYCYCLINE HYCLATE 100 MG PO TABS
100.0000 mg | ORAL_TABLET | Freq: Two times a day (BID) | ORAL | Status: DC
Start: 2017-04-17 — End: 2017-04-23
  Administered 2017-04-17 – 2017-04-23 (×11): 100 mg via ORAL
  Filled 2017-04-17 (×12): qty 1

## 2017-04-17 MED ORDER — FENTANYL CITRATE (PF) 100 MCG/2ML IJ SOLN
INTRAMUSCULAR | Status: AC
  Filled 2017-04-17: qty 2

## 2017-04-17 MED ORDER — MIDAZOLAM HCL 10 MG/2ML IJ SOLN
INTRAMUSCULAR | Status: DC | PRN
  Administered 2017-04-17: 2 mg via INTRAVENOUS
  Administered 2017-04-17: 1 mg via INTRAVENOUS

## 2017-04-17 MED ORDER — PANTOPRAZOLE SODIUM 40 MG PO TBEC
40.0000 mg | DELAYED_RELEASE_TABLET | Freq: Every day | ORAL | Status: DC
  Administered 2017-04-17 – 2017-04-23 (×3): 40 mg via ORAL
  Filled 2017-04-17 (×5): qty 1

## 2017-04-17 MED ORDER — MORPHINE SULFATE (PF) 4 MG/ML IV SOLN
1.0000 mg | INTRAVENOUS | Status: DC | PRN
Start: 2017-04-17 — End: 2017-04-23
  Administered 2017-04-17 (×2): 1 mg via INTRAVENOUS
  Filled 2017-04-17 (×2): qty 1

## 2017-04-17 MED ORDER — ZOLPIDEM TARTRATE 5 MG PO TABS
5.0000 mg | ORAL_TABLET | Freq: Every evening | ORAL | Status: DC | PRN
Start: 2017-04-17 — End: 2017-04-23
  Administered 2017-04-22: 5 mg via ORAL
  Filled 2017-04-17: qty 1

## 2017-04-17 MED ORDER — DOCUSATE SODIUM 100 MG PO CAPS
100.0000 mg | ORAL_CAPSULE | Freq: Two times a day (BID) | ORAL | Status: DC
  Administered 2017-04-17 – 2017-04-22 (×7): 100 mg via ORAL
  Filled 2017-04-17 (×10): qty 1

## 2017-04-17 MED ORDER — GLYCERIN LIQD
960.0000 mL | Freq: Once | Status: DC
Start: 2017-04-17 — End: 2017-04-17
  Filled 2017-04-17: qty 240

## 2017-04-17 MED ORDER — EFINACONAZOLE 10 % EX SOLN
1.0000 | Freq: Every day | CUTANEOUS | Status: DC
Start: 2017-04-17 — End: 2017-04-21
  Administered 2017-04-17: 1 via CUTANEOUS

## 2017-04-17 MED ORDER — PROCHLORPERAZINE EDISYLATE 5 MG/ML IJ SOLN
5.0000 mg | Freq: Once | INTRAMUSCULAR | Status: AC
  Administered 2017-04-17: 5 mg via INTRAVENOUS
  Filled 2017-04-17: qty 2

## 2017-04-17 MED ORDER — MIDAZOLAM HCL 5 MG/ML IJ SOLN
INTRAMUSCULAR | Status: AC
  Filled 2017-04-17: qty 2

## 2017-04-17 MED ORDER — FLEET ENEMA 7-19 GM/118ML RE ENEM
1.0000 | ENEMA | Freq: Once | RECTAL | Status: AC
  Administered 2017-04-17: 1 via RECTAL
  Filled 2017-04-17: qty 1

## 2017-04-17 MED ORDER — GLYCERIN LIQD
960.0000 mL | Status: AC
Start: 2017-04-17 — End: 2017-04-17
  Administered 2017-04-17: 960 mL via RECTAL
  Filled 2017-04-17: qty 240

## 2017-04-17 MED ORDER — ONDANSETRON HCL 4 MG/2ML IJ SOLN
4.0000 mg | Freq: Three times a day (TID) | INTRAMUSCULAR | Status: DC | PRN
  Administered 2017-04-17 – 2017-04-23 (×12): 4 mg via INTRAVENOUS
  Filled 2017-04-17 (×12): qty 2

## 2017-04-17 MED ORDER — POTASSIUM CHLORIDE CRYS ER 20 MEQ PO TBCR
40.0000 meq | EXTENDED_RELEASE_TABLET | Freq: Two times a day (BID) | ORAL | Status: AC
  Administered 2017-04-17: 40 meq via ORAL
  Filled 2017-04-17 (×2): qty 2

## 2017-04-17 MED ORDER — SODIUM CHLORIDE 0.9 % IV SOLN
INTRAVENOUS | Status: DC
  Administered 2017-04-17 – 2017-04-21 (×6): via INTRAVENOUS

## 2017-04-17 MED ORDER — ACETAMINOPHEN 650 MG RE SUPP: 650.0000 mg | Freq: Four times a day (QID) | RECTAL | Status: DC | PRN

## 2017-04-17 MED ORDER — FERROUS SULFATE 325 (65 FE) MG PO TABS
325.0000 mg | ORAL_TABLET | Freq: Three times a day (TID) | ORAL | Status: DC
  Administered 2017-04-21 – 2017-04-23 (×3): 325 mg via ORAL
  Filled 2017-04-17 (×8): qty 1

## 2017-04-17 MED ORDER — SACCHAROMYCES BOULARDII 250 MG PO CAPS
250.0000 mg | ORAL_CAPSULE | Freq: Every day | ORAL | Status: DC
  Administered 2017-04-17 – 2017-04-23 (×3): 250 mg via ORAL
  Filled 2017-04-17 (×5): qty 1

## 2017-04-17 MED ORDER — ALBUTEROL SULFATE (2.5 MG/3ML) 0.083% IN NEBU: 2.5000 mg | INHALATION_SOLUTION | RESPIRATORY_TRACT | Status: DC | PRN

## 2017-04-17 MED ORDER — HALOPERIDOL LACTATE 5 MG/ML IJ SOLN
2.0000 mg | Freq: Once | INTRAMUSCULAR | Status: AC
  Administered 2017-04-17: 2 mg via INTRAVENOUS
  Filled 2017-04-17: qty 1

## 2017-04-17 MED ORDER — HYDRALAZINE HCL 20 MG/ML IJ SOLN
5.0000 mg | INTRAMUSCULAR | Status: DC | PRN
Start: 2017-04-17 — End: 2017-04-23
  Administered 2017-04-20: 5 mg via INTRAVENOUS
  Filled 2017-04-17: qty 1

## 2017-04-17 MED ORDER — SODIUM CHLORIDE 0.9 % IV SOLN: INTRAVENOUS | Status: DC

## 2017-04-17 MED ORDER — POLYETHYLENE GLYCOL 3350 17 G PO PACK
17.0000 g | PACK | Freq: Every day | ORAL | Status: DC | PRN
  Administered 2017-04-19: 17 g via ORAL
  Filled 2017-04-17: qty 1

## 2017-04-17 MED ORDER — SODIUM CHLORIDE 0.9% FLUSH
3.0000 mL | Freq: Two times a day (BID) | INTRAVENOUS | Status: DC
  Administered 2017-04-18 – 2017-04-22 (×5): 3 mL via INTRAVENOUS

## 2017-04-17 MED ORDER — RIVAROXABAN 20 MG PO TABS
20.0000 mg | ORAL_TABLET | Freq: Every day | ORAL | Status: DC
  Administered 2017-04-17 – 2017-04-20 (×4): 20 mg via ORAL
  Filled 2017-04-17 (×4): qty 1

## 2017-04-17 NOTE — Consult Note (Signed)
Referring Provider:   Dr. Levora Angel Primary Care Physician:  Damaris Hippo, MD Primary Gastroenterologist:  None (unassigned)  Reason for Consultation:  Abnormal CT scan  HPI: [name] is a 61 y.o. female with evidence of a rectal mass on CT scan.  History obtained primarily from referring physician and review of record, since the patient herself is somewhat withdrawn.  She has a previous history of apparent colorectal cancer (records not available) who is now several weeks status post discharge from total hip replacement surgery. She is on Xarelto for a history of a DVT. She has seen minimal hematochezia.   CT scan shows a lobulated rectal mass with probable retroperitoneal adenopathy,and, per Dr. Wyline Copas, a biopsy of it has been requested by oncology.  Past Medical History:  Diagnosis Date  . Anxiety   . Arthritis   . Asthma    childhood   . Cancer [facility])    colorectal cancer with chemo and radiation   . DVT (deep venous thrombosis) ([place])   . Dyspnea    with anxiety and exertion  . GERD (gastroesophageal reflux disease)   . Neuromuscular disorder (HCC)    poly neuropathy   . Stroke [facility]) 2010, 2011   TIA  x2    Past Surgical History:  Procedure Laterality Date  . CESAREAN SECTION    . THYROID CYST EXCISION    . TOTAL HIP ARTHROPLASTY Left 03/25/2017   Procedure: LEFT TOTAL HIP ARTHROPLASTY ANTERIOR APPROACH;  Surgeon: Paralee Cancel, MD;  Location: [facility]S;  Service: Orthopedics;  Laterality: Left;  Requests 70 mins    Prior to Admission medications   Medication Sig Start Date End Date Taking? Authorizing Provider  bismuth subsalicylate (PEPTO BISMOL) 262 MG chewable tablet Chew 524 mg by mouth daily as needed for indigestion or diarrhea or loose stools.   Yes Historical Provider, MD  docusate sodium (COLACE) 100 MG capsule Take 1 capsule (100 mg total) by mouth 2 (two) times daily. 03/25/17  Yes Danae Orleans, PA-C  doxycycline (VIBRAMYCIN) 100 MG capsule Take 1  capsule (100 mg total) by mouth 2 (two) times daily. 03/27/17  Yes Matthew Babish, PA-C  Efinaconazole (JUBLIA) 10 % SOLN Apply 1 application topically daily.   Yes Historical Provider, MD  ferrous sulfate (FERROUSUL) 325 (65 FE) MG tablet Take 1 tablet (325 mg total) by mouth 3 (three) times daily with meals. 03/25/17  Yes Danae Orleans, PA-C  HYDROcodone-acetaminophen (NORCO/VICODIN) 5-325 MG tablet TK 1-2 TS PO Q 6 H PRN P 04/11/17  Yes Historical Provider, MD  methocarbamol (ROBAXIN) 500 MG tablet Take 1 tablet (500 mg total) by mouth every 6 (six) hours as needed for muscle spasms. 03/25/17  Yes Danae Orleans, PA-C  omeprazole (PRILOSEC) 20 MG capsule Take 20 mg by mouth daily.   Yes Historical Provider, MD  ondansetron (ZOFRAN) 4 MG tablet TK 1 T PO Q 6 H PRF NAUSEA 04/15/17  Yes Historical Provider, MD  polyethylene glycol (MIRALAX / GLYCOLAX) packet Take 17 g by mouth 2 (two) times daily. 03/25/17  Yes Danae Orleans, PA-C  rivaroxaban (XARELTO) 20 MG TABS tablet Take 1 tablet (20 mg total) by mouth daily with supper. Please follow up with PCP to continue this medication. 03/27/17  Yes Matthew Babish, PA-C  saccharomyces boulardii (FLORASTOR) 250 MG capsule Take 250 mg by mouth daily.   Yes Historical Provider, MD  HYDROcodone-acetaminophen (NORCO) 7.5-325 MG tablet Take 1-2 tablets by mouth every 4 (four) hours as needed for moderate pain or severe  pain. Patient not taking: Reported on 04/16/2017 03/25/17   Danae Orleans, PA-C  Methenamine-Sodium Salicylate (AZO URINARY TRACT DEFENSE PO) Take 2 tablets by mouth daily as needed (uti symptoms).    Historical Provider, MD    Current Facility-Administered Medications  Medication Dose Route Frequency Provider Last Rate Last Dose  . 0.9 %  sodium chloride infusion   Intravenous Continuous Ivor Costa, MD 100 mL/hr at 04/17/17 0645    . acetaminophen (TYLENOL) tablet 650 mg  650 mg Oral Q6H PRN Ivor Costa, MD       Or  . acetaminophen (TYLENOL) suppository  650 mg  650 mg Rectal Q6H PRN Ivor Costa, MD      . albuterol (PROVENTIL) (2.5 MG/3ML) 0.083% nebulizer solution 2.5 mg  2.5 mg Nebulization Q4H PRN Ivor Costa, MD      . bismuth subsalicylate (PEPTO BISMOL) chewable tablet 524 mg  524 mg Oral Daily PRN Ivor Costa, MD      . docusate sodium (COLACE) capsule 100 mg  100 mg Oral BID Ivor Costa, MD   100 mg at 04/17/17 1007  . doxycycline (VIBRA-TABS) tablet 100 mg  100 mg Oral BID Ivor Costa, MD   100 mg at 04/17/17 1007  . Efinaconazole 10 % SOLN 1 application  1 application Apply externally Daily Ivor Costa, MD   1 application at 03/00/92 1309  . ferrous sulfate tablet 325 mg  325 mg Oral TID WC Ivor Costa, MD      . hydrALAZINE (APRESOLINE) injection 5 mg  5 mg Intravenous Q2H PRN Ivor Costa, MD      . morphine 4 MG/ML injection 1 mg  1 mg Intravenous Q3H PRN Ivor Costa, MD   1 mg at 04/17/17 1027  . ondansetron (ZOFRAN) injection 4 mg  4 mg Intravenous Q8H PRN Ivor Costa, MD   4 mg at 04/17/17 1003  . pantoprazole (PROTONIX) EC tablet 40 mg  40 mg Oral Daily Ivor Costa, MD   40 mg at 04/17/17 1007  . polyethylene glycol (MIRALAX / GLYCOLAX) packet 17 g  17 g Oral Daily PRN Ivor Costa, MD      . rivaroxaban Alveda Reasons) tablet 20 mg  20 mg Oral Q supper Ivor Costa, MD      . saccharomyces boulardii (FLORASTOR) capsule 250 mg  250 mg Oral Daily Ivor Costa, MD   250 mg at 04/17/17 1007  . sodium chloride flush (NS) 0.9 % injection 3 mL  3 mL Intravenous Q12H Ivor Costa, MD      . sodium phosphate (FLEET) 7-19 GM/118ML enema 1 enema  1 enema Rectal Once Ronald Lobo, MD      . Theda Belfast (URELLE/URISED) 81 MG tablet 81 mg  1 tablet Oral Daily PRN Ivor Costa, MD      . zolpidem (AMBIEN) tablet 5 mg  5 mg Oral QHS PRN Ivor Costa, MD        Allergies as of 04/16/2017 - Review Complete 04/16/2017  Allergen Reaction Noted  . Penicillins Itching 03/13/2017    Family History  Problem Relation Age of Onset  . Hypertension Mother   . Dementia Mother     Social History    Social History  . Marital status: Divorced    Spouse name: N/A  . Number of children: N/A  . Years of education: N/A   Occupational History  . Not on file.   Social History Main Topics  . Smoking status: Former Smoker    Types: Cigarettes  .  Smokeless tobacco: Never Used     Comment: 20 years ago   . Alcohol use No  . Drug use: No  . Sexual activity: Not on file   Other Topics Concern  . Not on file   Social History Narrative  . No narrative on file    Review of Systems: not obtained  Physical Exam: Vital signs in last 24 hours: Temp:  [98.1 F (36.7 C)-98.7 F (37.1 C)] 98.1 F (36.7 C) (04/26 1327) Pulse Rate:  [68-136] 88 (04/26 1327) Resp:  [17-30] 18 (04/26 1327) BP: (149-189)/(63-101) 149/71 (04/26 1327) SpO2:  [98 %-100 %] 100 % (04/26 1327)   Severely overweight, somewhat somnolent and withdrawn, lying in bed in no distress. The chest is clear, the heart is without murmur or arrhythmia, and the abdomen is obese but nontender. Oropharynx is benign.  Intake/Output from previous day: No intake/output data recorded. Intake/Output this shift: No intake/output data recorded.  Lab Results:  Recent Labs  04/16/17 2123 04/17/17 0641 04/17/17 0941  WBC 9.6 10.1 10.2  HGB 11.4* 11.1* 11.2*  HCT 33.7* 33.0* 32.5*  PLT 470* 484* 448*   BMET  Recent Labs  04/16/17 2123 04/17/17 0638  NA 139 139  K 3.5 2.8*  CL 106 104  CO2 23 23  GLUCOSE 149* 135*  BUN 5* <5*  CREATININE 0.57 0.49  CALCIUM 9.3 9.3   LFT  Recent Labs  04/16/17 2123  PROT 7.9  ALBUMIN 4.0  AST 18  ALT 9*  ALKPHOS 102  BILITOT 0.4   PT/INR  Recent Labs  04/17/17 0638  LABPROT 15.1  INR 1.18    Studies/Results: Ct Head Wo Contrast  Result Date: 04/17/2017 CLINICAL DATA:  Altered mental status, panic attacks, colorectal cancer with chemotherapy and radiation. EXAM: CT HEAD WITHOUT CONTRAST TECHNIQUE: Contiguous axial images were obtained from the base of the  skull through the vertex without intravenous contrast. COMPARISON:  None. FINDINGS: Brain: No evidence of acute infarction, hemorrhage, hydrocephalus, extra-axial collection or mass lesion/mass effect. Mild superficial atrophy. Vascular: No hyperdense vessel or unexpected calcification. Skull: Normal. Negative for fracture or focal lesion. Sinuses/Orbits: No acute finding. Other: The patient is slightly tilted on this exam. IMPRESSION: No acute intracranial abnormality.  Mild superficial atrophy. Electronically Signed   By: Ashley Royalty M.D.   On: 04/17/2017 01:44   Ct Abdomen Pelvis W Contrast  Result Date: 04/17/2017 CLINICAL DATA:  61 year old female with nausea and vomiting. EXAM: CT ABDOMEN AND PELVIS WITH CONTRAST TECHNIQUE: Multidetector CT imaging of the abdomen and pelvis was performed using the standard protocol following bolus administration of intravenous contrast. CONTRAST:  100 cc Isovue-300 COMPARISON:  None. FINDINGS: Lower chest: The visualized lung bases are clear. No intra-abdominal free air or free fluid. Hepatobiliary: A subcentimeter hypodense lesion in the right lobe of the liver as well as a 13 mm low attenuating lesion in the left lobe of the liver are not well characterized on this CT. MRI may provide better characterisation. There is no intrahepatic biliary ductal dilatation. The gallbladder is unremarkable. Pancreas: Unremarkable. No pancreatic ductal dilatation or surrounding inflammatory changes. Spleen: Normal in size without focal abnormality. Adrenals/Urinary Tract: The adrenal glands are unremarkable. There is a 3 cm on left renal parapelvic cyst. A small exophytic cyst is noted from the inferior pole of the left kidney. Multiple small parenchymal calcific foci noted in the upper pole of the left kidney. There is no hydronephrosis on either side. There is symmetric uptake and excretion of  contrast by kidneys. The visualized ureters and urinary bladder appear unremarkable.  Stomach/Bowel: There is a 2.1 x 2.5 x 3.2 cm somewhat lobulated left posterior rectal mass. There is loss of fat plane between this lesion and the rectal wall. This may represent a neoplasm arising from the rectum or an abnormal/ neoplastic lymph node. An 11 x 13 mm left perirectal rounded lesion more superiorly is also noted which may represent extension of the larger mass or a mildly enlarged lymph node. Pelvic MRI is recommended for further characterization. Oral contrast mixed with stool noted throughout the colon. There is no evidence of bowel obstruction or active inflammation. Normal appendix. There is probable duodenal diverticulum at the head of the pancreas. Vascular/Lymphatic: There is minimal aortoiliac atherosclerotic disease. The abdominal aorta and IVC are otherwise unremarkable. No portal venous gas identified. Multiple retroperitoneal adenopathy noted. A cluster of adjacent left para-aortic lymph node measure 4.2 x 3.0 x 5.5 cm. Reproductive: The uterus and ovaries are grossly unremarkable as visualized. Other: There is a 3.7 x 1.2 x 1.7 cm low attenuating lesion to the left of the in L5 vertebra posterior to the psoas muscle consistent with metastatic disease. Musculoskeletal: Multilevel degenerative changes of the spine with osteophyte formation. The bones are osteopenic. There is degenerative changes with disc space narrowing at L5-S1. No acute fracture. There is a total left hip arthroplasty. IMPRESSION: 1. Left posterior perirectal lobulated mass concerning for rectal malignancy. A smaller nodular density superior to this mass likely represent a mildly enlarged metastatic node. MRI is recommended for further evaluation. 2. 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 A hypodense lesion to the left of the L5 posterior to the psoas muscle is also consistent with metastatic disease. 3. Multiple hepatic hypodense lesions, incompletely  characterized, possibly metastatic. MRI is recommended for further evaluation. 4. No evidence of bowel obstruction or active inflammation. Normal appendix. Electronically Signed   By: Anner Crete M.D.   On: 04/17/2017 02:04    Impression: Probable rectal mass. I have reviewed the films, and it is difficult to differentiate this from the surrounding stool  Plan: Flexible sigmoidoscopy (under sedation, per patient request) to try to see if we can obtain biopsies of the mass. I feel that this can be done while the patient is on Xarelto. There might be slightly increased oozing, but I would not expect any significant bleeding complication. The nature, purpose, and risks of the procedure were reviewed with the patient and she is agreeable.   LOS: 0 days   Gatha Mcnulty V  04/17/2017, 2:12 PM   Pager [phone] If no answer or after 5 PM call [phone]

## 2017-04-17 NOTE — Consult Note (Signed)
[name]  [phone]   ONCOLOGY INPATIENT CONSULTATION   [name]  DOB: 1956-03-31  MR#: [id number]  CSN#: [id number]    Requesting Physician: [facility]  Patient Care Team: Lynn Hippo, MD as PCP - General (Family Medicine)  Reason for consult: history of rectal cancer, possible metastasis   History of present illness: Pt is a 61 year old female from [place], with past medical history of DVT on Xarelto, rectal cancer status post chemotherapy and radiation in 2016, stroke, asthma, who was admitted to hospital last night for nausea, vomiting and abdominal pain. CT of abdomen and pelvis showed left posterior perirectal lobulated mass concerning for rectum malignancy. Retroperitoneal adenopathy, I hypodense lesion to the left L5 to the sore S muscle, suspicious for metastasis, and multiple hepatic hypodense lesions, indeterminate. I was called to see the patient to ruled out metastatic rectal cancer recurrence. I saw the patient before her endoscopy today.  The patient reports she has a prior history of rectal cancer stage 3, squamous cell, which was treated with chemotherapy and radiation therapy. She did not have surgery. The patient reports she completed treatment in September 2016. The patient has not used her port since 2016, though it is still in place. She reports the last time she followed up with physicians for her prior cancer, they told her she had suspicious masses in her lungs and liver.  The patient does not have any medical records from [place], where she previously lived and was treated, but reports her daughter would have them. She reports she does not know if she plans to stay in [place], or if she will return to [place].   MEDICAL HISTORY:  Past Medical History:  Diagnosis Date  . Anxiety   . Arthritis   . Asthma    childhood   . Cancer [facility])    colorectal cancer with chemo and radiation   . DVT (deep  venous thrombosis) ([place])   . Dyspnea    with anxiety and exertion  . GERD (gastroesophageal reflux disease)   . Neuromuscular disorder (H[facility])    poly neuropathy   . Stroke [facility]) 2010, 2011   TIA  x2    SURGICAL HISTORY: Past Surgical History:  Procedure Laterality Date  . CESAREAN SECTION    . THYROID CYST EXCISION    . TOTAL HIP ARTHROPLASTY Left 03/25/2017   Procedure: LEFT TOTAL HIP ARTHROPLASTY ANTERIOR APPROACH;  Surgeon: Paralee Cancel, MD;  Location: [facility];  Service: Orthopedics;  Laterality: Left;  Requests 70 mins    SOCIAL HISTORY: Social History   Social History  . Marital status: Divorced    Spouse name: N/A  . Number of children: N/A  . Years of education: N/A   Occupational History  . Not on file.   Social History Main Topics  . Smoking status: Former Smoker    Types: Cigarettes  . Smokeless tobacco: Never Used     Comment: 20 years ago   . Alcohol use No  . Drug use: No  . Sexual activity: Not on file   Other Topics Concern  . Not on file   Social History Narrative  . No narrative on file  The patient's daughter lives in [place]. The patient reports she lives in [place] alone.  FAMILY HISTORY: Family History  Problem Relation Age of Onset  . Hypertension Mother   . Dementia Mother     ALLERGIES:  is allergic to penicillins.  MEDICATIONS:  Current  Facility-Administered Medications  Medication Dose Route Frequency Provider Last Rate Last Dose  . 0.9 %  sodium chloride infusion   Intravenous Continuous Ivor Costa, MD 100 mL/hr at 04/17/17 0645    . acetaminophen (TYLENOL) tablet 650 mg  650 mg Oral Q6H PRN Ivor Costa, MD       Or  . acetaminophen (TYLENOL) suppository 650 mg  650 mg Rectal Q6H PRN Ivor Costa, MD      . albuterol (PROVENTIL) (2.5 MG/3ML) 0.083% nebulizer solution 2.5 mg  2.5 mg Nebulization Q4H PRN Ivor Costa, MD      . bismuth subsalicylate (PEPTO BISMOL) chewable tablet 524 mg  524 mg Oral Daily PRN Ivor Costa, MD      .  docusate sodium (COLACE) capsule 100 mg  100 mg Oral BID Ivor Costa, MD   100 mg at 04/17/17 1007  . doxycycline (VIBRA-TABS) tablet 100 mg  100 mg Oral BID Ivor Costa, MD   100 mg at 04/17/17 1007  . Efinaconazole 10 % SOLN 1 application  1 application Apply externally Daily Ivor Costa, MD   1 application at 04/88/89 1309  . ferrous sulfate tablet 325 mg  325 mg Oral TID WC Ivor Costa, MD      . hydrALAZINE (APRESOLINE) injection 5 mg  5 mg Intravenous Q2H PRN Ivor Costa, MD      . morphine 4 MG/ML injection 1 mg  1 mg Intravenous Q3H PRN Ivor Costa, MD   1 mg at 04/17/17 1027  . ondansetron (ZOFRAN) injection 4 mg  4 mg Intravenous Q8H PRN Ivor Costa, MD   4 mg at 04/17/17 1003  . pantoprazole (PROTONIX) EC tablet 40 mg  40 mg Oral Daily Ivor Costa, MD   40 mg at 04/17/17 1007  . polyethylene glycol (MIRALAX / GLYCOLAX) packet 17 g  17 g Oral Daily PRN Ivor Costa, MD      . rivaroxaban Alveda Reasons) tablet 20 mg  20 mg Oral Q supper Ivor Costa, MD      . saccharomyces boulardii (FLORASTOR) capsule 250 mg  250 mg Oral Daily Ivor Costa, MD   250 mg at 04/17/17 1007  . sodium chloride flush (NS) 0.9 % injection 3 mL  3 mL Intravenous Q12H Ivor Costa, MD      . URELLE (URELLE/URISED) 81 MG tablet 81 mg  1 tablet Oral Daily PRN Ivor Costa, MD      . zolpidem (AMBIEN) tablet 5 mg  5 mg Oral QHS PRN Ivor Costa, MD        REVIEW OF SYSTEMS:   Constitutional: Denies fevers, chills or abnormal night sweats Eyes: Denies blurriness of vision, double vision or watery eyes Ears, nose, mouth, throat, and face: Denies mucositis or sore throat Respiratory: Denies cough, dyspnea or wheezes Cardiovascular: Denies palpitation, chest discomfort or lower extremity swelling Gastrointestinal:  Denies nausea, heartburn or change in bowel habits Skin: Denies abnormal skin rashes Lymphatics: Denies new lymphadenopathy or easy bruising Neurological:Denies numbness, tingling or new weaknesses Behavioral/Psych: Mood is stable, no new  changes  All other systems were reviewed with the patient and are negative.  PHYSICAL EXAMINATION: ECOG PERFORMANCE STATUS: 1 - Symptomatic but completely ambulatory  Vitals:   04/17/17 1605 04/17/17 1620  BP: (!) 159/64   Pulse: (!) 105 80  Resp: (!) 27 (!) 28  Temp:  97.9 F (36.6 C)   There were no vitals filed for this visit.  GENERAL:alert, no distress and comfortable SKIN: skin color, texture, turgor are  normal, no rashes or significant lesions EYES: normal, conjunctiva are pink and non-injected, sclera clear OROPHARYNX:no exudate, no erythema and lips, buccal mucosa, and tongue normal  NECK: supple, thyroid normal size, non-tender, without nodularity LYMPH:  no palpable lymphadenopathy in the cervical, axillary or inguinal LUNGS: clear to auscultation and percussion with normal breathing effort HEART: regular rate & rhythm and no murmurs and no lower extremity edema ABDOMEN:abdomen soft, non-tender and normal bowel sounds Musculoskeletal:no cyanosis of digits and no clubbing  PSYCH: alert & oriented x 3 with fluent speech NEURO: no focal motor/sensory deficits  LABORATORY DATA:  I have reviewed the data as listed Lab Results  Component Value Date   WBC 10.2 04/17/2017   HGB 11.2 (L) 04/17/2017   HCT 32.5 (L) 04/17/2017   MCV 80.4 04/17/2017   PLT 448 (H) 04/17/2017    Recent Labs  03/27/17 0422 04/16/17 2123 04/17/17 0638  NA 138 139 139  K 3.4* 3.5 2.8*  CL 107 106 104  CO2 26 23 23   GLUCOSE 88 149* 135*  BUN 9 5* <5*  CREATININE 0.59 0.57 0.49  CALCIUM 8.2* 9.3 9.3  GFRNONAA >60 >60 >60  GFRAA >60 >60 >60  PROT  --  7.9  --   ALBUMIN  --  4.0  --   AST  --  18  --   ALT  --  9*  --   ALKPHOS  --  102  --   BILITOT  --  0.4  --     RADIOGRAPHIC STUDIES: I have personally reviewed the radiological images as listed and agreed with the findings in the report. Ct Head Wo Contrast  Result Date: 04/17/2017 CLINICAL DATA:  Altered mental status,  panic attacks, colorectal cancer with chemotherapy and radiation. EXAM: CT HEAD WITHOUT CONTRAST TECHNIQUE: Contiguous axial images were obtained from the base of the skull through the vertex without intravenous contrast. COMPARISON:  None. FINDINGS: Brain: No evidence of acute infarction, hemorrhage, hydrocephalus, extra-axial collection or mass lesion/mass effect. Mild superficial atrophy. Vascular: No hyperdense vessel or unexpected calcification. Skull: Normal. Negative for fracture or focal lesion. Sinuses/Orbits: No acute finding. Other: The patient is slightly tilted on this exam. IMPRESSION: No acute intracranial abnormality.  Mild superficial atrophy. Electronically Signed   By: Ashley Royalty M.D.   On: 04/17/2017 01:44   Ct Abdomen Pelvis W Contrast  Result Date: 04/17/2017 CLINICAL DATA:  61 year old female with nausea and vomiting. EXAM: CT ABDOMEN AND PELVIS WITH CONTRAST TECHNIQUE: Multidetector CT imaging of the abdomen and pelvis was performed using the standard protocol following bolus administration of intravenous contrast. CONTRAST:  100 cc Isovue-300 COMPARISON:  None. FINDINGS: Lower chest: The visualized lung bases are clear. No intra-abdominal free air or free fluid. Hepatobiliary: A subcentimeter hypodense lesion in the right lobe of the liver as well as a 13 mm low attenuating lesion in the left lobe of the liver are not well characterized on this CT. MRI may provide better characterisation. There is no intrahepatic biliary ductal dilatation. The gallbladder is unremarkable. Pancreas: Unremarkable. No pancreatic ductal dilatation or surrounding inflammatory changes. Spleen: Normal in size without focal abnormality. Adrenals/Urinary Tract: The adrenal glands are unremarkable. There is a 3 cm on left renal parapelvic cyst. A small exophytic cyst is noted from the inferior pole of the left kidney. Multiple small parenchymal calcific foci noted in the upper pole of the left kidney. There is no  hydronephrosis on either side. There is symmetric uptake and excretion of contrast  by kidneys. The visualized ureters and urinary bladder appear unremarkable. Stomach/Bowel: There is a 2.1 x 2.5 x 3.2 cm somewhat lobulated left posterior rectal mass. There is loss of fat plane between this lesion and the rectal wall. This may represent a neoplasm arising from the rectum or an abnormal/ neoplastic lymph node. An 11 x 13 mm left perirectal rounded lesion more superiorly is also noted which may represent extension of the larger mass or a mildly enlarged lymph node. Pelvic MRI is recommended for further characterization. Oral contrast mixed with stool noted throughout the colon. There is no evidence of bowel obstruction or active inflammation. Normal appendix. There is probable duodenal diverticulum at the head of the pancreas. Vascular/Lymphatic: There is minimal aortoiliac atherosclerotic disease. The abdominal aorta and IVC are otherwise unremarkable. No portal venous gas identified. Multiple retroperitoneal adenopathy noted. A cluster of adjacent left para-aortic lymph node measure 4.2 x 3.0 x 5.5 cm. Reproductive: The uterus and ovaries are grossly unremarkable as visualized. Other: There is a 3.7 x 1.2 x 1.7 cm low attenuating lesion to the left of the in L5 vertebra posterior to the psoas muscle consistent with metastatic disease. Musculoskeletal: Multilevel degenerative changes of the spine with osteophyte formation. The bones are osteopenic. There is degenerative changes with disc space narrowing at L5-S1. No acute fracture. There is a total left hip arthroplasty. IMPRESSION: 1. Left posterior perirectal lobulated mass concerning for rectal malignancy. A smaller nodular density superior to this mass likely represent a mildly enlarged metastatic node. MRI is recommended for further evaluation. 2. 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 A hypodense lesion  to the left of the L5 posterior to the psoas muscle is also consistent with metastatic disease. 3. Multiple hepatic hypodense lesions, incompletely characterized, possibly metastatic. MRI is recommended for further evaluation. 4. No evidence of bowel obstruction or active inflammation. Normal appendix. Electronically Signed   By: Anner Crete M.D.   On: 04/17/2017 02:04   Dg C-arm 61-120 Min-no Report  Result Date: 03/25/2017 Fluoroscopy was utilized by the requesting physician.  No radiographic interpretation.   Dg Hip Port Unilat With Pelvis 1v Left  Result Date: 03/25/2017 CLINICAL DATA:  Status post left total hip replacement. EXAM: DG HIP (WITH OR WITHOUT PELVIS) 1V PORT LEFT COMPARISON:  None. FINDINGS: Left total hip prosthesis in satisfactory position and alignment. There is fragmentation of the superior aspect of the greater trochanter. Diffuse osteopenia. IMPRESSION: Satisfactory appearance of a left total hip prosthesis with fragmentation of the superior aspect of the left greater trochanter. Electronically Signed   By: Claudie Revering M.D.   On: 03/25/2017 10:57    ASSESSMENT & PLAN: 61 year old female from [place], with past medical history of DVT on Xarelto, rectal cancer status post chemotherapy and radiation in 2016, stroke, asthma, who was admitted to hospital last night for nausea, vomiting and abdominal pain.   1. History of rectal squamous cell carcinoma, status post concurrent chemotherapy and radiation (per pt) in 2016 in [place] 2. Possible metastatic cancer to liver, pelvis and L5, sigmoidoscopy (-) 3. Nausea, abdominal pain, secondary to constipation vs #2 4. History of DVT, on xarelto  5. History of asthma, GERD   Recommendations: -Sigmoidoscopy with Dr. Cristina Gong today, which was negative for mass or other other suspicious lesions  -I recommend abdominal MRI with and without contrast to further evaluate her liver lesions -Based on the CT and MRI findings, I'll  discuss with IR regarding further biopsy for  tissue diagnosis -I have left a message for her daughter to bring in her outside medical records -I'll recommend outpatient CT chest or PET scan for further evaluation -if her symptoms get better, she can be discharged, we will arrange her biopsy as outpt. She needs to hold Xarelto before biopsy   All questions were answered. The patient knows to call the clinic with any problems, questions or concerns. I spent a total of 60 mins for the consult.   This document serves as a record of services personally performed by Truitt Merle, MD. It was created on her behalf by Maryla Morrow, a trained medical scribe. The creation of this record is based on the scribe's personal observations and the provider's statements to them. This document has been checked and approved by the attending provider.    Truitt Merle, MD 04/17/2017 5:07 PM

## 2017-04-17 NOTE — Op Note (Signed)
[facility] Patient Name: [name] Procedure Date: 04/17/2017 MRN: [id number] Attending MD: Ronald Lobo , MD Date of Birth: 10-21-1956 CSN: [id number] Age: 61 Admit Type: Inpatient Procedure:                Flexible Sigmoidoscopy Indications:              Abnormal CT of the GI tract--??lobulated rectal                            mass w/?retroperitoneal lymphadenopathy in pt s/p                            chemoradiation for cancer of the "rectum" (treated                            in [place], records not currently available) Providers:                Ronald Lobo, MD, Elmer Ramp. Tilden Dome, RN, [employer], Technician, Tinnie Gens, Technician Referring MD:              Medicines:                Midazolam 3 mg IV Complications:            No immediate complications. Estimated Blood Loss:     Estimated blood loss was minimal. Procedure:                Pre-Anesthesia Assessment:                           - Prior to the procedure, a History and Physical                            was performed, and patient medications and                            allergies were reviewed. The patient's tolerance of                            previous anesthesia was also reviewed. The risks                            and benefits of the procedure and the sedation                            options and risks were discussed with the patient.                            All questions were answered, and informed consent                            was obtained. Prior Anticoagulants: The patient has  taken Xarelto (rivaroxaban), last dose was 1 day                            prior to procedure. ASA Grade Assessment: III - A                            patient with severe systemic disease. After                            reviewing the risks and benefits, the patient was                            deemed in satisfactory condition  to undergo the                            procedure.                           After obtaining informed consent, the scope was                            passed under direct vision. The [id number] ([id number])                            ultraslim colonoscope was introduced through the                            anus and advanced to 40 cm from the anal verge (as                            measured during pullback after taking out loops,                            felt to correspond to the proximal descending                            colon). The flexible sigmoidoscopy was accomplished                            without difficulty. The patient tolerated the                            procedure well. The quality of the bowel                            preparation (enemas) was excellent. Scope In: 4:02:47 PM Scope Out: 4:11:13 PM Total Procedure Duration: 0 hours 8 minutes 26 seconds  Findings:      The perianal exam findings include small skin tags without any       suspicious excrescences to suggest anal cancer.      A localized area of abnormal mucosa was found in the distal rectum,       covering approximately 1/3 of the circumference. It was characterized by       telangectasiae, exudate/mucosal erosion,  slight       friability/fragility--all consistent with radiation changes. No mass       effect or lobulations, but this area of the rectum felt stiff and       non-compliant/nondistensible (for which reason retroflexion was not       attempted). Conservative biopsies were taken with a cold forceps for       histology. Estimated blood loss was minimal, with essentially complete       hemostasis. The remainder of the exam, including the proximal rectum,       sigmoid colon, and distal descending colon all looked normal. Impression:               - Perianal skin tag found on perianal exam but no                            morphologic evidence of anal cancer.                           -  Atrophic, eroded and vascular-pattern-decreased                            mucosa in the distal rectum consistent with                            radiation changes. Biopsied.                           - No mass or findings suggestive of any current                            malignant process within the rectal lumen                           - Fixation/non-distensibility of the rectal                            ampulla, possibly due to prior radiation, but                            conceivably could be due to pelvic metastatic                            disease. Moderate Sedation:      This patient was sedated with monitored anesthesia care, not moderate       sedation. Recommendation:           - Await pathology results.                           - Refer to an oncologist. Procedure Code(s):        --- Professional ---                           [phone], Sigmoidoscopy, flexible; with biopsy, single                            or multiple Diagnosis Code(s):        ---  Professional ---                           R93.3, Abnormal findings on diagnostic imaging of                            other parts of digestive tract CPT copyright 2016 [employer]. All rights reserved. The codes documented in this report are preliminary and upon coder review may  be revised to meet current compliance requirements. Ronald Lobo, MD 04/17/2017 4:43:19 PM This report has been signed electronically. Number of Addenda: 0

## 2017-04-17 NOTE — Progress Notes (Signed)
Somewhat surprisingly, flexible sigmoidoscopy shows no rectal mass. I was able to reach the area of the proximal descending colon., which was well beyond the area of apparent abnormality on CT scan.  The only things I noticed were (1) mucosal changes suggestive of previous radiation therapy;  And (2) lack of compliance or distensibility of the rectal ampulla, which could be the result of radiation exposure or possibly metastatic disease in the pelvis.  Biopsies of the abnormal mucosa in the rectum are pending, and should be ready tomorrow by noon.  No significant bleeding from biopsies; ok to keep patient on Xarelto  (no need to hold dose b/o today's bx's).  Call me if questions.  Cleotis Nipper, M.D. Pager [phone] If no answer or after 5 PM call [phone]

## 2017-04-17 NOTE — Care Management Note (Signed)
Case Management Note  Patient Details  Name: [name] MRN: [id number] Date of Birth: Sep 27, 1956  Subjective/Objective:  61 y/o f admitted w/abd pain. From home.                  Action/Plan:d/c plan home.   Expected Discharge Date:                  Expected Discharge Plan:  Home/Self Care  In-House Referral:     Discharge planning Services  CM Consult  Post Acute Care Choice:    Choice offered to:     DME Arranged:    DME Agency:     HH Arranged:    HH Agency:     Status of Service:  In process, will continue to follow  If discussed at Long Length of Stay Meetings, dates discussed:    Additional Comments:  Dessa Phi, RN 04/17/2017, 1:01 PM

## 2017-04-17 NOTE — H&P (Signed)
History and Physical    [name] WNI:[id number] DOB: [birth year] DOA: 04/16/2017  Referring MD/NP/PA:   PCP: Damaris Hippo, MD   Patient coming from:  The patient is coming from home.  At baseline, pt is independent for most of ADL.   Chief Complaint: Nausea, vomiting, abdominal pain and AMS.  HPI: [name] is a 61 y.o. female with medical history significant of DVT on xarelto, colorectal cancer (s/p of chemo and radiation), stroke, asthma, GERD, anxiety, polyneuropathy, who presents with nausea, vomiting, abdominal pain.  Pt states that she started having nausea, vomiting and abdominal pain at about 2:30 PM. She vomited 5-6 times without blood in the vomitus. Her abdominal pain is located in the middle abdomen, 4/10 in severity, sharp, nonradiating. Patient does not have diarrhea, fever or chills. Patient denies symptoms of UTI.  Per her daughter, patient has chronic mild SOB sometimes, which has not changed. Currently no shortness breath, chest pain, cough. No unilateral weakness. Pt recently had left hip surgery on 03/25/17, currently she is taking Norco, Robaxin and doxycycline for possible left hip infection. Per her daughter, pt did not miss doses of xarelto. Pt is mildly confused. Per EDP, pt has "catatonic behavior in ED". Per her daughter, the patient has had similar episodes in the past. Per nurse the patient has been screaming "I cannot be in this room". Per her daughter, tiny amount of blood in her stool was noted when he wipes.  ED Course: pt was found to have WBC 9.6, negative troponin, lipase 17, Tylenol level less than 10, salicylate level less than 7, negative urinalysis, electrolytes renal function okay, elevated blood pressure 181/72, oxygen saturation 98% on room air. CT head is negative for acute intracranial abnormalities. Patient is admitted to telemetry bed as inpatient.  CT abdomen/pelvis showed:  1. Left posterior perirectal lobulated mass concerning  for rectal malignancy. A smaller nodular density superior to this mass likely represent a mildly enlarged metastatic node. MRI is recommended for further evaluation. 2. 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 A hypodense lesion to the left of the L5 posterior to the psoas muscle is also consistent with metastatic disease. 3. Multiple hepatic hypodense lesions, incompletely characterized, possibly metastatic. MRI is recommended for further evaluation. 4. No evidence of bowel obstruction or active inflammation. Normal appendix.  Review of Systems:   General: no fevers, chills, no changes in body weight, has poor appetite, has fatigue HEENT: no blurry vision, hearing changes or sore throat Respiratory: has dyspnea, no coughing, wheezing CV: no chest pain, no palpitations GI: has nausea, vomiting, abdominal pain, no iarrhea, constipation GU: no dysuria, burning on urination, increased urinary frequency, hematuria  Ext: no leg edema Neuro: no unilateral weakness, numbness, or tingling, no vision change or hearing loss Skin: no rash, no skin tear. MSK: s/p of right hip surgery Heme: No easy bruising.  Travel history: No recent long distant travel.  Allergy:  Allergies  Allergen Reactions  . Penicillins Itching    Has patient had a PCN reaction causing immediate rash, facial/tongue/throat swelling, SOB or lightheadedness with hypotension: Unknown Has patient had a PCN reaction causing severe rash involving mucus membranes or skin necrosis: Unknown Has patient had a PCN reaction that required hospitalization Unknown Has patient had a PCN reaction occurring within the last 10 years: Unknown Daughter states pt cant take any derivative of penicillin either    Past Medical History:  Diagnosis Date  . Anxiety   . Arthritis   .  Asthma    childhood   . Cancer [facility])    colorectal cancer with chemo and radiation   . DVT (deep venous  thrombosis) ([place])   . Dyspnea    with anxiety and exertion  . GERD (gastroesophageal reflux disease)   . Neuromuscular disorder (HCC)    poly neuropathy   . Stroke [facility]) 2010, 2011   TIA  x2    Past Surgical History:  Procedure Laterality Date  . CESAREAN SECTION    . THYROID CYST EXCISION    . TOTAL HIP ARTHROPLASTY Left 03/25/2017   Procedure: LEFT TOTAL HIP ARTHROPLASTY ANTERIOR APPROACH;  Surgeon: Paralee Cancel, MD;  Location: [facility];  Service: Orthopedics;  Laterality: Left;  Requests 70 mins    Social History:  reports that she has quit smoking. Her smoking use included Cigarettes. She has never used smokeless tobacco. She reports that she does not drink alcohol or use drugs.  Family History:  Family History  Problem Relation Age of Onset  . Hypertension Mother   . Dementia Mother      Prior to Admission medications   Medication Sig Start Date End Date Taking? Authorizing Provider  bismuth subsalicylate (PEPTO BISMOL) 262 MG chewable tablet Chew 524 mg by mouth daily as needed for indigestion or diarrhea or loose stools.   Yes Historical Provider, MD  docusate sodium (COLACE) 100 MG capsule Take 1 capsule (100 mg total) by mouth 2 (two) times daily. 03/25/17  Yes Danae Orleans, PA-C  doxycycline (VIBRAMYCIN) 100 MG capsule Take 1 capsule (100 mg total) by mouth 2 (two) times daily. 03/27/17  Yes Matthew Babish, PA-C  Efinaconazole (JUBLIA) 10 % SOLN Apply 1 application topically daily.   Yes Historical Provider, MD  ferrous sulfate (FERROUSUL) 325 (65 FE) MG tablet Take 1 tablet (325 mg total) by mouth 3 (three) times daily with meals. 03/25/17  Yes Danae Orleans, PA-C  HYDROcodone-acetaminophen (NORCO/VICODIN) 5-325 MG tablet TK 1-2 TS PO Q 6 H PRN P 04/11/17  Yes Historical Provider, MD  methocarbamol (ROBAXIN) 500 MG tablet Take 1 tablet (500 mg total) by mouth every 6 (six) hours as needed for muscle spasms. 03/25/17  Yes Danae Orleans, PA-C  omeprazole (PRILOSEC) 20 MG capsule  Take 20 mg by mouth daily.   Yes Historical Provider, MD  ondansetron (ZOFRAN) 4 MG tablet TK 1 T PO Q 6 H PRF NAUSEA 04/15/17  Yes Historical Provider, MD  polyethylene glycol (MIRALAX / GLYCOLAX) packet Take 17 g by mouth 2 (two) times daily. 03/25/17  Yes Danae Orleans, PA-C  rivaroxaban (XARELTO) 20 MG TABS tablet Take 1 tablet (20 mg total) by mouth daily with supper. Please follow up with PCP to continue this medication. 03/27/17  Yes Matthew Babish, PA-C  saccharomyces boulardii (FLORASTOR) 250 MG capsule Take 250 mg by mouth daily.   Yes Historical Provider, MD  HYDROcodone-acetaminophen (NORCO) 7.5-325 MG tablet Take 1-2 tablets by mouth every 4 (four) hours as needed for moderate pain or severe pain. Patient not taking: Reported on 04/16/2017 03/25/17   Danae Orleans, PA-C  Methenamine-Sodium Salicylate (AZO URINARY TRACT DEFENSE PO) Take 2 tablets by mouth daily as needed (uti symptoms).    Historical Provider, MD    Physical Exam: Vitals:   04/16/17 2104 04/16/17 2238 04/17/17 0218  BP: (!) 158/83 (!) 166/85 (!) 154/101  Pulse: 88 92 89  Resp: (!) 29 (!) 22 17  SpO2: 100% 100% 98%   General: Not in acute distress HEENT:  Eyes: PERRL, EOMI, no scleral icterus.       ENT: No discharge from the ears and nose, no pharynx injection, no tonsillar enlargement.        Neck: No JVD, no bruit, no mass felt. Heme: No neck lymph node enlargement. Cardiac: S1/S2, RRR, No murmurs, No gallops or rubs. Respiratory: No rales, wheezing, rhonchi or rubs. GI: Soft, nondistended, has tenderness in central abdoment, no rebound pain, no organomegaly, BS present. GU: No hematuria Ext: No pitting leg edema bilaterally. 2+DP/PT pulse bilaterally. Musculoskeletal: surgical site in the left hip looks healing well. Skin: No rashes.  Neuro: mildly confused, but oriented X3, cranial nerves II-XII grossly intact, moves all extremities normally.  Psych :no suicidal or hemocidal ideation.  Labs on  Admission: I have personally reviewed following labs and imaging studies  CBC:  Recent Labs Lab 04/16/17 2123  WBC 9.6  NEUTROABS 8.4*  HGB 11.4*  HCT 33.7*  MCV 79.7  PLT 335*   Basic Metabolic Panel:  Recent Labs Lab 04/16/17 2123  NA 139  K 3.5  CL 106  CO2 23  GLUCOSE 149*  BUN 5*  CREATININE 0.57  CALCIUM 9.3   GFR: CrCl cannot be calculated (Unknown ideal weight.). Liver Function Tests:  Recent Labs Lab 04/16/17 2123  AST 18  ALT 9*  ALKPHOS 102  BILITOT 0.4  PROT 7.9  ALBUMIN 4.0    Recent Labs Lab 04/16/17 2123  LIPASE 17   No results for input(s): AMMONIA in the last 168 hours. Coagulation Profile: No results for input(s): INR, PROTIME in the last 168 hours. Cardiac Enzymes: No results for input(s): CKTOTAL, CKMB, CKMBINDEX, TROPONINI in the last 168 hours. BNP (last 3 results) No results for input(s): PROBNP in the last 8760 hours. HbA1C: No results for input(s): HGBA1C in the last 72 hours. CBG: No results for input(s): GLUCAP in the last 168 hours. Lipid Profile: No results for input(s): CHOL, HDL, LDLCALC, TRIG, CHOLHDL, LDLDIRECT in the last 72 hours. Thyroid Function Tests: No results for input(s): TSH, T4TOTAL, FREET4, T3FREE, THYROIDAB in the last 72 hours. Anemia Panel: No results for input(s): VITAMINB12, FOLATE, FERRITIN, TIBC, IRON, RETICCTPCT in the last 72 hours. Urine analysis:    Component Value Date/Time   COLORURINE YELLOW 04/16/2017 2124   APPEARANCEUR CLOUDY (A) 04/16/2017 2124   LABSPEC 1.009 04/16/2017 2124   PHURINE 8.0 04/16/2017 2124   GLUCOSEU NEGATIVE 04/16/2017 2124   HGBUR SMALL (A) 04/16/2017 2124   BILIRUBINUR NEGATIVE 04/16/2017 2124   KETONESUR 20 (A) 04/16/2017 2124   PROTEINUR NEGATIVE 04/16/2017 2124   NITRITE NEGATIVE 04/16/2017 2124   LEUKOCYTESUR NEGATIVE 04/16/2017 2124   Sepsis Labs: @LABRCNTIP (procalcitonin:4,lacticidven:4) )No results found for this or any previous visit (from the  past 240 hour(s)).   Radiological Exams on Admission: Ct Head Wo Contrast  Result Date: 04/17/2017 CLINICAL DATA:  Altered mental status, panic attacks, colorectal cancer with chemotherapy and radiation. EXAM: CT HEAD WITHOUT CONTRAST TECHNIQUE: Contiguous axial images were obtained from the base of the skull through the vertex without intravenous contrast. COMPARISON:  None. FINDINGS: Brain: No evidence of acute infarction, hemorrhage, hydrocephalus, extra-axial collection or mass lesion/mass effect. Mild superficial atrophy. Vascular: No hyperdense vessel or unexpected calcification. Skull: Normal. Negative for fracture or focal lesion. Sinuses/Orbits: No acute finding. Other: The patient is slightly tilted on this exam. IMPRESSION: No acute intracranial abnormality.  Mild superficial atrophy. Electronically Signed   By: Ashley Royalty M.D.   On: 04/17/2017 01:44   Ct Abdomen  Pelvis W Contrast  Result Date: 04/17/2017 CLINICAL DATA:  61 year old female with nausea and vomiting. EXAM: CT ABDOMEN AND PELVIS WITH CONTRAST TECHNIQUE: Multidetector CT imaging of the abdomen and pelvis was performed using the standard protocol following bolus administration of intravenous contrast. CONTRAST:  100 cc Isovue-300 COMPARISON:  None. FINDINGS: Lower chest: The visualized lung bases are clear. No intra-abdominal free air or free fluid. Hepatobiliary: A subcentimeter hypodense lesion in the right lobe of the liver as well as a 13 mm low attenuating lesion in the left lobe of the liver are not well characterized on this CT. MRI may provide better characterisation. There is no intrahepatic biliary ductal dilatation. The gallbladder is unremarkable. Pancreas: Unremarkable. No pancreatic ductal dilatation or surrounding inflammatory changes. Spleen: Normal in size without focal abnormality. Adrenals/Urinary Tract: The adrenal glands are unremarkable. There is a 3 cm on left renal parapelvic cyst. A small exophytic cyst is  noted from the inferior pole of the left kidney. Multiple small parenchymal calcific foci noted in the upper pole of the left kidney. There is no hydronephrosis on either side. There is symmetric uptake and excretion of contrast by kidneys. The visualized ureters and urinary bladder appear unremarkable. Stomach/Bowel: There is a 2.1 x 2.5 x 3.2 cm somewhat lobulated left posterior rectal mass. There is loss of fat plane between this lesion and the rectal wall. This may represent a neoplasm arising from the rectum or an abnormal/ neoplastic lymph node. An 11 x 13 mm left perirectal rounded lesion more superiorly is also noted which may represent extension of the larger mass or a mildly enlarged lymph node. Pelvic MRI is recommended for further characterization. Oral contrast mixed with stool noted throughout the colon. There is no evidence of bowel obstruction or active inflammation. Normal appendix. There is probable duodenal diverticulum at the head of the pancreas. Vascular/Lymphatic: There is minimal aortoiliac atherosclerotic disease. The abdominal aorta and IVC are otherwise unremarkable. No portal venous gas identified. Multiple retroperitoneal adenopathy noted. A cluster of adjacent left para-aortic lymph node measure 4.2 x 3.0 x 5.5 cm. Reproductive: The uterus and ovaries are grossly unremarkable as visualized. Other: There is a 3.7 x 1.2 x 1.7 cm low attenuating lesion to the left of the in L5 vertebra posterior to the psoas muscle consistent with metastatic disease. Musculoskeletal: Multilevel degenerative changes of the spine with osteophyte formation. The bones are osteopenic. There is degenerative changes with disc space narrowing at L5-S1. No acute fracture. There is a total left hip arthroplasty. IMPRESSION: 1. Left posterior perirectal lobulated mass concerning for rectal malignancy. A smaller nodular density superior to this mass likely represent a mildly enlarged metastatic node. MRI is  recommended for further evaluation. 2. 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 A hypodense lesion to the left of the L5 posterior to the psoas muscle is also consistent with metastatic disease. 3. Multiple hepatic hypodense lesions, incompletely characterized, possibly metastatic. MRI is recommended for further evaluation. 4. No evidence of bowel obstruction or active inflammation. Normal appendix. Electronically Signed   By: Anner Crete M.D.   On: 04/17/2017 02:04     EKG: Independently reviewed.  Sinus rhythm, QTC 460, early R-wave progression    Assessment/Plan Principal Problem:   Abdominal pain Active Problems:   DVT (deep venous thrombosis) (HCC)   Asthma   GERD (gastroesophageal reflux disease)   Nausea & vomiting   Colorectal cancer (HCC)   Rectal bleeding   Acute encephalopathy   Abdominal  pain, nausea, vomiting: Possibly due to metastasized to disease as shown by CT of abdomen/pelvis. Lipase normal. Per patient's daughter, patient was treated for stage III colorectal cancer in [place] 2016. She is s/p of chemotherapy and radiation therapy. She is trying to establish care with oncologist in [facility], but did not get appointment yet.  -will admit to tele bed as inpt. -prn Zofran for nausea and morphine for pain -IV fluids: 1 L normal saline, followed by 100 mL/h -May consult to oncology morning  Colon rectal cancer: s/p of chemotherapy and radiation therapy. Now has metastasized to disease -see above -check CEA and AFP level  DVT (deep venous thrombosis) ([place]): -continue Xarelto  Asthma: stable. -prn albuterol nebs  GERD: -Protonix  Acute encephalopathy: has some psych symptoms. Patient was treated with Haldol and one dose of narcan in ED. Now pt is calm. -please consult to psychiatry morning -hold robaxin and norco -check ammonia level  Rectal bleeding: Per her daughter, tiny amount of  blood in her stool was noted when he wipes. Hemoglobin 11.4 which was 9.8 on 4/60/18. Hemodynamically stable. Most likely due to recurrent colorectal cancer. -will continue her Xarelto now -cbc q6h-->transfuse if Hgb<7.0 -- INR/PTT/type & screen  Elevated blood pressure: Patient does not have history of hypertension. Her blood pressure was elevated at 181/72. Likely due to nausea and vomiting -IVF hydralazine when necessary   DVT ppx: on Xarelto Code Status: Full code Family Communication: Yes, patient's daughter at bed side Disposition Plan:  Anticipate discharge back to previous home environment Consults called:  none Admission status:  Inpatient/tele     Date of Service 04/17/2017    Ivor Costa [facility] Pager [phone]  If 7PM-7AM, please contact night-coverage [email or web address] Password [email or web address] 04/17/2017, 4:30 AM

## 2017-04-17 NOTE — Progress Notes (Signed)
PROGRESS NOTE    [name]  ZJQ:[id number] DOB: July 28, 1956 DOA: 04/16/2017 PCP: Damaris Hippo, MD    Brief Narrative:  61 y.o. female with medical history significant of DVT on xarelto, colorectal cancer (s/p of chemo and radiation), stroke, asthma, GERD, anxiety, polyneuropathy, who presents with nausea, vomiting, abdominal pain.  Pt states that she started having nausea, vomiting and abdominal pain at about 2:30 PM. She vomited 5-6 times without blood in the vomitus. Her abdominal pain is located in the middle abdomen, 4/10 in severity, sharp, nonradiating. Patient does not have diarrhea, fever or chills. Patient denies symptoms of UTI.  Per her daughter, patient has chronic mild SOB sometimes, which has not changed. Currently no shortness breath, chest pain, cough. No unilateral weakness. Pt recently had left hip surgery on 03/25/17, currently she is taking Norco, Robaxin and doxycycline for possible left hip infection. Per her daughter, pt did not miss doses of xarelto. Pt is mildly confused. Per EDP, pt has "catatonic behavior in ED". Per her daughter, the patient has had similar episodes in the past. Per nurse the patient has been screaming "I cannot be in this room". Per her daughter, tiny amount of blood in her stool was noted when he wipes  Assessment & Plan:   Principal Problem:   Abdominal pain Active Problems:   DVT (deep venous thrombosis) (HCC)   Asthma   GERD (gastroesophageal reflux disease)   Nausea & vomiting   Colorectal cancer (HCC)   Rectal bleeding   Acute encephalopathy  Abdominal pain, nausea, vomiting:  - Outside records pending. Family present in room who could provide history. Patient reported has hx of stage 3 colon cancer s/p chemo and radiation in 2016. Per daughter, pt was noted to be in remission and since then, lost to follow up.  - CT scan personally reviewed. 2-3cm rectal mass noted, as well as L5 lesion and multiple nodes - Imaging also  demonstrates stool throughout the colon from ascending through rectal vault. Suspect constipation is more likely etiology for presenting abdominal pain, distension, nausea, decreased appetite - Have given trial of SMOG enema with limited results. Will continue cathartics to promote bowel motility  Colon rectal cancer:  - Reportedly s/p of chemotherapy and radiation therapy. - Pt now with concerns of metastatic disease as per above - Have discussed case with Oncology, who recommends GI consult for possible sigmoidoscopy to obtain biopsy - Have consulted GI. Appreciate assistance. Pt is currently in endoscopy suite. - Have ordered MRI abd with/without contrast for more detailed imaging per Oncology recs  DVT (deep venous thrombosis) ([place]): -Will continue Xarelto as tolerated - No signs of active bleed  Asthma:  -stable at this time -prn albuterol nebs  GERD: -Protonix as tolerated  Acute encephalopathy:  -Had required Haldol and one dose of narcan in ED.  - Pt currently stable and conversing appropriately  Rectal bleeding:  - small amounts of blood reported by family member - consider hemorrhoidal given constipation vs from rectal mass - will continue her Xarelto now - Repeat CBC in AM  Elevated blood pressure:  - No prior hx of hypertension.  - improved, however still suboptimally controlled -will continue with IVF hydralazine when necessary  DVT prophylaxis: Xarelto Code Status: Full Family Communication: Pt in room, family at bedside Disposition Plan: Uncertain at this time  Consultants:   GI   Oncology  Procedures:   Sigmoidoscopy 4/26  Antimicrobials: Anti-infectives    Start     Dose/Rate Route Frequency Ordered  Stop   04/17/17 1000  [MAR Hold]  doxycycline (VIBRA-TABS) tablet 100 mg     (MAR Hold since 04/17/17 1413)   100 mg Oral 2 times daily 04/17/17 0402         Subjective: Complains of abd fullness and pressure with decreased  appetitie  Objective: Vitals:   04/17/17 1555 04/17/17 1558 04/17/17 1600 04/17/17 1605  BP: (!) 163/68  (!) 154/67 (!) 159/64  Pulse: 72 89 85 (!) 105  Resp: 17 (!) 8 (!) 28 (!) 27  Temp:      TempSrc:      SpO2: 100% 100% 100% 100%    Intake/Output Summary (Last 24 hours) at 04/17/17 1613 Last data filed at 04/17/17 1020  Gross per 24 hour  Intake                0 ml  Output                0 ml  Net                0 ml   There were no vitals filed for this visit.  Examination:  General exam: Appears calm and comfortable  Respiratory system: Clear to auscultation. Respiratory effort normal. Cardiovascular system: S1 & S2 heard, RRR. Gastrointestinal system: mildly distended, decreased BS, generally tender Central nervous system: Alert and oriented. No focal neurological deficits. Extremities: Symmetric 5 x 5 power. Skin: No rashes, lesions Psychiatry: Judgement and insight appear normal. Mood & affect appropriate.   Data Reviewed: I have personally reviewed following labs and imaging studies  CBC:  Recent Labs Lab 04/16/17 2123 04/17/17 0641 04/17/17 0941  WBC 9.6 10.1 10.2  NEUTROABS 8.4*  --   --   HGB 11.4* 11.1* 11.2*  HCT 33.7* 33.0* 32.5*  MCV 79.7 79.3 80.4  PLT 470* 484* 165*   Basic Metabolic Panel:  Recent Labs Lab 04/16/17 2123 04/17/17 0638  NA 139 139  K 3.5 2.8*  CL 106 104  CO2 23 23  GLUCOSE 149* 135*  BUN 5* <5*  CREATININE 0.57 0.49  CALCIUM 9.3 9.3   GFR: CrCl cannot be calculated (Unknown ideal weight.). Liver Function Tests:  Recent Labs Lab 04/16/17 2123  AST 18  ALT 9*  ALKPHOS 102  BILITOT 0.4  PROT 7.9  ALBUMIN 4.0    Recent Labs Lab 04/16/17 2123  LIPASE 17    Recent Labs Lab 04/17/17 0638  AMMONIA 11   Coagulation Profile:  Recent Labs Lab 04/17/17 0638  INR 1.18   Cardiac Enzymes: No results for input(s): CKTOTAL, CKMB, CKMBINDEX, TROPONINI in the last 168 hours. BNP (last 3 results) No  results for input(s): PROBNP in the last 8760 hours. HbA1C: No results for input(s): HGBA1C in the last 72 hours. CBG:  Recent Labs Lab 04/17/17 0840  GLUCAP 116*   Lipid Profile: No results for input(s): CHOL, HDL, LDLCALC, TRIG, CHOLHDL, LDLDIRECT in the last 72 hours. Thyroid Function Tests: No results for input(s): TSH, T4TOTAL, FREET4, T3FREE, THYROIDAB in the last 72 hours. Anemia Panel: No results for input(s): VITAMINB12, FOLATE, FERRITIN, TIBC, IRON, RETICCTPCT in the last 72 hours. Sepsis Labs: No results for input(s): PROCALCITON, LATICACIDVEN in the last 168 hours.  No results found for this or any previous visit (from the past 240 hour(s)).   Radiology Studies: Ct Head Wo Contrast  Result Date: 04/17/2017 CLINICAL DATA:  Altered mental status, panic attacks, colorectal cancer with chemotherapy and radiation. EXAM: CT HEAD WITHOUT  CONTRAST TECHNIQUE: Contiguous axial images were obtained from the base of the skull through the vertex without intravenous contrast. COMPARISON:  None. FINDINGS: Brain: No evidence of acute infarction, hemorrhage, hydrocephalus, extra-axial collection or mass lesion/mass effect. Mild superficial atrophy. Vascular: No hyperdense vessel or unexpected calcification. Skull: Normal. Negative for fracture or focal lesion. Sinuses/Orbits: No acute finding. Other: The patient is slightly tilted on this exam. IMPRESSION: No acute intracranial abnormality.  Mild superficial atrophy. Electronically Signed   By: Ashley Royalty M.D.   On: 04/17/2017 01:44   Ct Abdomen Pelvis W Contrast  Result Date: 04/17/2017 CLINICAL DATA:  61 year old female with nausea and vomiting. EXAM: CT ABDOMEN AND PELVIS WITH CONTRAST TECHNIQUE: Multidetector CT imaging of the abdomen and pelvis was performed using the standard protocol following bolus administration of intravenous contrast. CONTRAST:  100 cc Isovue-300 COMPARISON:  None. FINDINGS: Lower chest: The visualized lung bases  are clear. No intra-abdominal free air or free fluid. Hepatobiliary: A subcentimeter hypodense lesion in the right lobe of the liver as well as a 13 mm low attenuating lesion in the left lobe of the liver are not well characterized on this CT. MRI may provide better characterisation. There is no intrahepatic biliary ductal dilatation. The gallbladder is unremarkable. Pancreas: Unremarkable. No pancreatic ductal dilatation or surrounding inflammatory changes. Spleen: Normal in size without focal abnormality. Adrenals/Urinary Tract: The adrenal glands are unremarkable. There is a 3 cm on left renal parapelvic cyst. A small exophytic cyst is noted from the inferior pole of the left kidney. Multiple small parenchymal calcific foci noted in the upper pole of the left kidney. There is no hydronephrosis on either side. There is symmetric uptake and excretion of contrast by kidneys. The visualized ureters and urinary bladder appear unremarkable. Stomach/Bowel: There is a 2.1 x 2.5 x 3.2 cm somewhat lobulated left posterior rectal mass. There is loss of fat plane between this lesion and the rectal wall. This may represent a neoplasm arising from the rectum or an abnormal/ neoplastic lymph node. An 11 x 13 mm left perirectal rounded lesion more superiorly is also noted which may represent extension of the larger mass or a mildly enlarged lymph node. Pelvic MRI is recommended for further characterization. Oral contrast mixed with stool noted throughout the colon. There is no evidence of bowel obstruction or active inflammation. Normal appendix. There is probable duodenal diverticulum at the head of the pancreas. Vascular/Lymphatic: There is minimal aortoiliac atherosclerotic disease. The abdominal aorta and IVC are otherwise unremarkable. No portal venous gas identified. Multiple retroperitoneal adenopathy noted. A cluster of adjacent left para-aortic lymph node measure 4.2 x 3.0 x 5.5 cm. Reproductive: The uterus and ovaries  are grossly unremarkable as visualized. Other: There is a 3.7 x 1.2 x 1.7 cm low attenuating lesion to the left of the in L5 vertebra posterior to the psoas muscle consistent with metastatic disease. Musculoskeletal: Multilevel degenerative changes of the spine with osteophyte formation. The bones are osteopenic. There is degenerative changes with disc space narrowing at L5-S1. No acute fracture. There is a total left hip arthroplasty. IMPRESSION: 1. Left posterior perirectal lobulated mass concerning for rectal malignancy. A smaller nodular density superior to this mass likely represent a mildly enlarged metastatic node. MRI is recommended for further evaluation. 2. 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 A hypodense lesion to the left of the L5 posterior to the psoas muscle is also consistent with metastatic disease. 3. Multiple hepatic hypodense lesions, incompletely characterized, possibly metastatic.  MRI is recommended for further evaluation. 4. No evidence of bowel obstruction or active inflammation. Normal appendix. Electronically Signed   By: Anner Crete M.D.   On: 04/17/2017 02:04    Scheduled Meds: . [MAR Hold] docusate sodium  100 mg Oral BID  . [MAR Hold] doxycycline  100 mg Oral BID  . [MAR Hold] Efinaconazole  1 application Apply externally Daily  . [MAR Hold] ferrous sulfate  325 mg Oral TID WC  . [MAR Hold] pantoprazole  40 mg Oral Daily  . [MAR Hold] rivaroxaban  20 mg Oral Q supper  . [MAR Hold] saccharomyces boulardii  250 mg Oral Daily  . [MAR Hold] sodium chloride flush  3 mL Intravenous Q12H   Continuous Infusions: . sodium chloride 100 mL/hr at 04/17/17 0645  . sodium chloride       LOS: 0 days   Anjelique Makar, Orpah Melter, MD [facility] Pager [phone]  If 7PM-7AM, please contact night-coverage [email or web address] Password [email or web address] 04/17/2017, 4:13 PM

## 2017-04-18 ENCOUNTER — Inpatient Hospital Stay (HOSPITAL_COMMUNITY): Payer: Federal, State, Local not specified - PPO

## 2017-04-18 DIAGNOSIS — K219 Gastro-esophageal reflux disease without esophagitis: Secondary | ICD-10-CM

## 2017-04-18 DIAGNOSIS — C801 Malignant (primary) neoplasm, unspecified: Secondary | ICD-10-CM | POA: Diagnosis present

## 2017-04-18 LAB — GLUCOSE, CAPILLARY: Glucose-Capillary: 95 mg/dL (ref 65–99)

## 2017-04-18 LAB — CBC
HEMATOCRIT: 36.4 % (ref 36.0–46.0)
HEMOGLOBIN: 12.5 g/dL (ref 12.0–15.0)
MCH: 27.7 pg (ref 26.0–34.0)
MCHC: 34.3 g/dL (ref 30.0–36.0)
MCV: 80.5 fL (ref 78.0–100.0)
Platelets: 510 10*3/uL — ABNORMAL HIGH (ref 150–400)
RBC: 4.52 MIL/uL (ref 3.87–5.11)
RDW: 18.8 % — ABNORMAL HIGH (ref 11.5–15.5)
WBC: 10.5 10*3/uL (ref 4.0–10.5)

## 2017-04-18 LAB — BASIC METABOLIC PANEL
Anion gap: 13 (ref 5–15)
CHLORIDE: 101 mmol/L (ref 101–111)
CO2: 24 mmol/L (ref 22–32)
CREATININE: 0.51 mg/dL (ref 0.44–1.00)
Calcium: 9.4 mg/dL (ref 8.9–10.3)
GFR calc Af Amer: >60 mL/min (ref >60)
GFR calc non Af Amer: >60 mL/min (ref >60)
GLUCOSE: 106 mg/dL — AB (ref 65–99)
POTASSIUM: 3 mmol/L — AB (ref 3.5–5.1)
Sodium: 138 mmol/L (ref 135–145)

## 2017-04-18 LAB — AFP TUMOR MARKER: AFP-Tumor Marker: 5 ng/mL (ref 0.0–8.3)

## 2017-04-18 LAB — URINE CULTURE

## 2017-04-18 LAB — CEA: CEA: 1.5 ng/mL (ref 0.0–4.7)

## 2017-04-18 LAB — MAGNESIUM: Magnesium: 1.9 mg/dL (ref 1.7–2.4)

## 2017-04-18 MED ORDER — METOCLOPRAMIDE HCL 5 MG/ML IJ SOLN
5.0000 mg | Freq: Three times a day (TID) | INTRAMUSCULAR | Status: DC
  Administered 2017-04-18 – 2017-04-20 (×5): 5 mg via INTRAVENOUS
  Filled 2017-04-18 (×6): qty 2

## 2017-04-18 MED ORDER — GADOBENATE DIMEGLUMINE 529 MG/ML IV SOLN: 15.0000 mL | Freq: Once | INTRAVENOUS | Status: DC | PRN

## 2017-04-18 MED ORDER — POTASSIUM CHLORIDE CRYS ER 20 MEQ PO TBCR
40.0000 meq | EXTENDED_RELEASE_TABLET | Freq: Two times a day (BID) | ORAL | Status: AC
  Administered 2017-04-18: 40 meq via ORAL
  Filled 2017-04-18: qty 2

## 2017-04-18 MED ORDER — SORBITOL 70 % SOLN
960.0000 mL | TOPICAL_OIL | Freq: Once | ORAL | Status: AC
  Administered 2017-04-18: 960 mL via RECTAL
  Filled 2017-04-18 (×2): qty 240

## 2017-04-18 MED ORDER — PROCHLORPERAZINE EDISYLATE 5 MG/ML IJ SOLN
5.0000 mg | Freq: Once | INTRAMUSCULAR | Status: AC
  Administered 2017-04-18: 5 mg via INTRAVENOUS
  Filled 2017-04-18: qty 2

## 2017-04-18 NOTE — Progress Notes (Signed)
PROGRESS NOTE    [name]  IRC:[id number] DOB: 05/25/1956 DOA: 04/16/2017 PCP: Damaris Hippo, MD    Brief Narrative:  61 y.o. female with medical history significant of DVT on xarelto, colorectal cancer (s/p of chemo and radiation), stroke, asthma, GERD, anxiety, polyneuropathy, who presents with nausea, vomiting, abdominal pain.  Pt states that she started having nausea, vomiting and abdominal pain at about 2:30 PM. She vomited 5-6 times without blood in the vomitus. Her abdominal pain is located in the middle abdomen, 4/10 in severity, sharp, nonradiating. Patient does not have diarrhea, fever or chills. Patient denies symptoms of UTI.  Per her daughter, patient has chronic mild SOB sometimes, which has not changed. Currently no shortness breath, chest pain, cough. No unilateral weakness. Pt recently had left hip surgery on 03/25/17, currently she is taking Norco, Robaxin and doxycycline for possible left hip infection. Per her daughter, pt did not miss doses of xarelto. Pt is mildly confused. Per EDP, pt has "catatonic behavior in ED". Per her daughter, the patient has had similar episodes in the past. Per nurse the patient has been screaming "I cannot be in this room". Per her daughter, tiny amount of blood in her stool was noted when he wipes  Assessment & Plan:   Principal Problem:   Abdominal pain Active Problems:   DVT (deep venous thrombosis) (HCC)   Asthma   GERD (gastroesophageal reflux disease)   Nausea & vomiting   Colorectal cancer (HCC)   Rectal bleeding   Acute encephalopathy   Cancer (HCC)  Abdominal pain, nausea, vomiting:  - Outside records pending. Family present in room who could provide history. Patient reported has hx of stage 3 colon cancer s/p chemo and radiation in 2016. Per daughter, pt was noted to be in remission and since then, lost to follow up.  - CT scan personally reviewed. 2-3cm rectal mass noted, as well as L5 lesion and multiple nodes -  Imaging also demonstrates stool throughout the colon from ascending through rectal vault. Suspect constipation is more likely etiology for presenting abdominal pain, distension, nausea, decreased appetite - Limited results with SMOG enema. Will give another trial. Patient remains nauseated - Will give trial of IV reglan  Colon rectal cancer:  - Reportedly s/p of chemotherapy and radiation therapy. - Pt now with concerns of metastatic disease as per above - Have discussed case with Oncology, who recommends GI consult for possible sigmoidoscopy to obtain biopsy - Have consulted GI. Appreciate assistance. Pt is currently in endoscopy suite. - Was unable to tolerate MRI abd with/without contrast. Will re-attempt study  DVT (deep venous thrombosis) ([place]): -Will continue Xarelto as tolerated - No signs of active bleed - Stable  Asthma:  -stable currently -prn albuterol nebs  GERD: -Protonix continued  Acute encephalopathy:  -Had required Haldol and one dose of narcan in ED.  - seems stable at this time  Rectal bleeding:  - small amounts of blood reported by family member - consider hemorrhoidal given constipation vs from rectal mass - will continue her Xarelto now - Will recheck cbc in AM  Elevated blood pressure:  - No prior hx of hypertension.  - improved, however still suboptimally controlled -Plan to cont with IVF hydralazine when necessary  DVT prophylaxis: Xarelto Code Status: Full Family Communication: Pt in room, family at bedside Disposition Plan: Uncertain at this time  Consultants:   GI   Oncology  Procedures:   Sigmoidoscopy 4/26  Antimicrobials: Anti-infectives    Start  Dose/Rate Route Frequency Ordered Stop   04/17/17 1000  doxycycline (VIBRA-TABS) tablet 100 mg     100 mg Oral 2 times daily 04/17/17 0402        Subjective: Complains of continued nausea  Objective: Vitals:   04/17/17 1620 04/17/17 2050 04/18/17 0547 04/18/17 1315    BP:  (!) 167/79 (!) 167/78 (!) 169/93  Pulse: 80 92 66 77  Resp: (!) 28 (!) 24 20 20   Temp: 97.9 F (36.6 C) 98.9 F (37.2 C) 97.5 F (36.4 C) 99.1 F (37.3 C)  TempSrc:  Oral Oral Oral  SpO2: 100% 100% 99% 100%    Intake/Output Summary (Last 24 hours) at 04/18/17 1611 Last data filed at 04/18/17 1400  Gross per 24 hour  Intake             3585 ml  Output              100 ml  Net             3485 ml   There were no vitals filed for this visit.  Examination:  General exam: laying in bed, awake, in nad Respiratory system: normal resp effort, no audible wheezing Cardiovascular system: regular rate, s1, s2 Gastrointestinal system: soft, nondistended, pos BS Central nervous system: cn2-12 grossly intact, strength intact Extremities: perfused, no clubbing Skin: normal skin turgor, no notable skin lesions seen Psychiatry: mood normal// no visual hallucinations  Data Reviewed: I have personally reviewed following labs and imaging studies  CBC:  Recent Labs Lab 04/16/17 2123 04/17/17 0641 04/17/17 0941 04/18/17 0624  WBC 9.6 10.1 10.2 10.5  NEUTROABS 8.4*  --   --   --   HGB 11.4* 11.1* 11.2* 12.5  HCT 33.7* 33.0* 32.5* 36.4  MCV 79.7 79.3 80.4 80.5  PLT 470* 484* 448* 277*   Basic Metabolic Panel:  Recent Labs Lab 04/16/17 2123 04/17/17 0638 04/18/17 0624  NA 139 139 138  K 3.5 2.8* 3.0*  CL 106 104 101  CO2 23 23 24   GLUCOSE 149* 135* 106*  BUN 5* <5* <5*  CREATININE 0.57 0.49 0.51  CALCIUM 9.3 9.3 9.4  MG  --   --  1.9   GFR: CrCl cannot be calculated (Unknown ideal weight.). Liver Function Tests:  Recent Labs Lab 04/16/17 2123  AST 18  ALT 9*  ALKPHOS 102  BILITOT 0.4  PROT 7.9  ALBUMIN 4.0    Recent Labs Lab 04/16/17 2123  LIPASE 17    Recent Labs Lab 04/17/17 0638  AMMONIA 11   Coagulation Profile:  Recent Labs Lab 04/17/17 0638  INR 1.18   Cardiac Enzymes: No results for input(s): CKTOTAL, CKMB, CKMBINDEX, TROPONINI  in the last 168 hours. BNP (last 3 results) No results for input(s): PROBNP in the last 8760 hours. HbA1C: No results for input(s): HGBA1C in the last 72 hours. CBG:  Recent Labs Lab 04/17/17 0840 04/18/17 0807  GLUCAP 116* 95   Lipid Profile: No results for input(s): CHOL, HDL, LDLCALC, TRIG, CHOLHDL, LDLDIRECT in the last 72 hours. Thyroid Function Tests: No results for input(s): TSH, T4TOTAL, FREET4, T3FREE, THYROIDAB in the last 72 hours. Anemia Panel: No results for input(s): VITAMINB12, FOLATE, FERRITIN, TIBC, IRON, RETICCTPCT in the last 72 hours. Sepsis Labs: No results for input(s): PROCALCITON, LATICACIDVEN in the last 168 hours.  Recent Results (from the past 240 hour(s))  Urine culture     Status: Abnormal   Collection Time: 04/16/17  9:24 PM  Result Value  Ref Range Status   Specimen Description URINE, RANDOM  Final   Special Requests NONE  Final   Culture MULTIPLE SPECIES PRESENT, SUGGEST RECOLLECTION (A)  Final   Report Status 04/18/2017 FINAL  Final     Radiology Studies: Ct Head Wo Contrast  Result Date: 04/17/2017 CLINICAL DATA:  Altered mental status, panic attacks, colorectal cancer with chemotherapy and radiation. EXAM: CT HEAD WITHOUT CONTRAST TECHNIQUE: Contiguous axial images were obtained from the base of the skull through the vertex without intravenous contrast. COMPARISON:  None. FINDINGS: Brain: No evidence of acute infarction, hemorrhage, hydrocephalus, extra-axial collection or mass lesion/mass effect. Mild superficial atrophy. Vascular: No hyperdense vessel or unexpected calcification. Skull: Normal. Negative for fracture or focal lesion. Sinuses/Orbits: No acute finding. Other: The patient is slightly tilted on this exam. IMPRESSION: No acute intracranial abnormality.  Mild superficial atrophy. Electronically Signed   By: Ashley Royalty M.D.   On: 04/17/2017 01:44   Ct Abdomen Pelvis W Contrast  Result Date: 04/17/2017 CLINICAL DATA:  61 year old  female with nausea and vomiting. EXAM: CT ABDOMEN AND PELVIS WITH CONTRAST TECHNIQUE: Multidetector CT imaging of the abdomen and pelvis was performed using the standard protocol following bolus administration of intravenous contrast. CONTRAST:  100 cc Isovue-300 COMPARISON:  None. FINDINGS: Lower chest: The visualized lung bases are clear. No intra-abdominal free air or free fluid. Hepatobiliary: A subcentimeter hypodense lesion in the right lobe of the liver as well as a 13 mm low attenuating lesion in the left lobe of the liver are not well characterized on this CT. MRI may provide better characterisation. There is no intrahepatic biliary ductal dilatation. The gallbladder is unremarkable. Pancreas: Unremarkable. No pancreatic ductal dilatation or surrounding inflammatory changes. Spleen: Normal in size without focal abnormality. Adrenals/Urinary Tract: The adrenal glands are unremarkable. There is a 3 cm on left renal parapelvic cyst. A small exophytic cyst is noted from the inferior pole of the left kidney. Multiple small parenchymal calcific foci noted in the upper pole of the left kidney. There is no hydronephrosis on either side. There is symmetric uptake and excretion of contrast by kidneys. The visualized ureters and urinary bladder appear unremarkable. Stomach/Bowel: There is a 2.1 x 2.5 x 3.2 cm somewhat lobulated left posterior rectal mass. There is loss of fat plane between this lesion and the rectal wall. This may represent a neoplasm arising from the rectum or an abnormal/ neoplastic lymph node. An 11 x 13 mm left perirectal rounded lesion more superiorly is also noted which may represent extension of the larger mass or a mildly enlarged lymph node. Pelvic MRI is recommended for further characterization. Oral contrast mixed with stool noted throughout the colon. There is no evidence of bowel obstruction or active inflammation. Normal appendix. There is probable duodenal diverticulum at the head of  the pancreas. Vascular/Lymphatic: There is minimal aortoiliac atherosclerotic disease. The abdominal aorta and IVC are otherwise unremarkable. No portal venous gas identified. Multiple retroperitoneal adenopathy noted. A cluster of adjacent left para-aortic lymph node measure 4.2 x 3.0 x 5.5 cm. Reproductive: The uterus and ovaries are grossly unremarkable as visualized. Other: There is a 3.7 x 1.2 x 1.7 cm low attenuating lesion to the left of the in L5 vertebra posterior to the psoas muscle consistent with metastatic disease. Musculoskeletal: Multilevel degenerative changes of the spine with osteophyte formation. The bones are osteopenic. There is degenerative changes with disc space narrowing at L5-S1. No acute fracture. There is a total left hip arthroplasty. IMPRESSION: 1. Left posterior  perirectal lobulated mass concerning for rectal malignancy. A smaller nodular density superior to this mass likely represent a mildly enlarged metastatic node. MRI is recommended for further evaluation. 2. 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 A hypodense lesion to the left of the L5 posterior to the psoas muscle is also consistent with metastatic disease. 3. Multiple hepatic hypodense lesions, incompletely characterized, possibly metastatic. MRI is recommended for further evaluation. 4. No evidence of bowel obstruction or active inflammation. Normal appendix. Electronically Signed   By: Anner Crete M.D.   On: 04/17/2017 02:04    Scheduled Meds: . docusate sodium  100 mg Oral BID  . doxycycline  100 mg Oral BID  . Efinaconazole  1 application Apply externally Daily  . ferrous sulfate  325 mg Oral TID WC  . metoCLOPramide (REGLAN) injection  5 mg Intravenous Q8H  . pantoprazole  40 mg Oral Daily  . potassium chloride  40 mEq Oral BID  . potassium chloride  40 mEq Oral BID  . rivaroxaban  20 mg Oral Q supper  . saccharomyces boulardii  250 mg Oral Daily   . sodium chloride flush  3 mL Intravenous Q12H  . sorbitol, milk of mag, mineral oil, glycerin (SMOG) enema  960 mL Rectal Once   Continuous Infusions: . sodium chloride 100 mL/hr at 04/18/17 0556     LOS: 1 day   Dolores Ewing, Orpah Melter, MD [facility] Pager [phone]  If 7PM-7AM, please contact night-coverage [email or web address] Password [email or web address] 04/18/2017, 4:11 PM

## 2017-04-18 NOTE — Evaluation (Signed)
OT Cancellation Note  Patient Details Name: Domenique Quest MRN: [id number] DOB: [birth year]   Cancelled Treatment:    Reason Eval/Treat Not Completed: Other (comment); Pt c/o dizziness upon entering room. Was feeling nauseous this AM. RN aware. Will check back later as time permits.  Lou Cal, OT Pager [phone] 04/18/2017   [name] 04/18/2017, 2:08 PM

## 2017-04-18 NOTE — Progress Notes (Signed)
PT Cancellation Note  Patient Details Name: Rhylie Stehr MRN: [id number] DOB: May 30, 1956   Cancelled Treatment:    Reason Eval/Treat Not Completed: Medical issues which prohibited therapy (pt is nauseous (slightly improved from this morning) and dizzy, not able to participate in PT at this time. Will attempt again tomorrow. )   Philomena Doheny 04/18/2017, 1:21 PM [phone]

## 2017-04-18 NOTE — Progress Notes (Signed)
Pt with nausea and vomiting. Pt requesting a new medication for vomiting. MD notified. Will continue to monitor.

## 2017-04-18 NOTE — Progress Notes (Signed)
PT Cancellation Note  Patient Details Name: [name] MRN: [id number] DOB: 1956-09-11   Cancelled Treatment:    Reason Eval/Treat Not Completed: Medical issues which prohibited therapy (pt c/o severe nausea, she is writhing/moaning in bed, reports nausea medication she had this morning hasn't helped, RN notified, will follow. )   Philomena Doheny 04/18/2017, 9:46 AM [phone]

## 2017-04-19 ENCOUNTER — Inpatient Hospital Stay (HOSPITAL_COMMUNITY): Payer: Federal, State, Local not specified - PPO

## 2017-04-19 DIAGNOSIS — C801 Malignant (primary) neoplasm, unspecified: Secondary | ICD-10-CM

## 2017-04-19 LAB — CBC
HCT: 34.8 % — ABNORMAL LOW (ref 36.0–46.0)
Hemoglobin: 12 g/dL (ref 12.0–15.0)
MCH: 27.7 pg (ref 26.0–34.0)
MCHC: 34.5 g/dL (ref 30.0–36.0)
MCV: 80.4 fL (ref 78.0–100.0)
Platelets: 457 10*3/uL — ABNORMAL HIGH (ref 150–400)
RBC: 4.33 MIL/uL (ref 3.87–5.11)
RDW: 18.3 % — ABNORMAL HIGH (ref 11.5–15.5)
WBC: 10.3 10*3/uL (ref 4.0–10.5)

## 2017-04-19 LAB — BASIC METABOLIC PANEL
Anion gap: 10 (ref 5–15)
CHLORIDE: 102 mmol/L (ref 101–111)
CO2: 23 mmol/L (ref 22–32)
CREATININE: 0.47 mg/dL (ref 0.44–1.00)
Calcium: 8.9 mg/dL (ref 8.9–10.3)
GFR calc Af Amer: >60 mL/min (ref >60)
GFR calc non Af Amer: >60 mL/min (ref >60)
Glucose, Bld: 102 mg/dL — ABNORMAL HIGH (ref 65–99)
Potassium: 2.7 mmol/L — CL (ref 3.5–5.1)
SODIUM: 135 mmol/L (ref 135–145)

## 2017-04-19 LAB — GLUCOSE, CAPILLARY: Glucose-Capillary: 91 mg/dL (ref 65–99)

## 2017-04-19 MED ORDER — LORAZEPAM 2 MG/ML IJ SOLN
1.0000 mg | Freq: Once | INTRAMUSCULAR | Status: AC
  Administered 2017-04-19: 1 mg via INTRAVENOUS
  Filled 2017-04-19: qty 1

## 2017-04-19 MED ORDER — POTASSIUM CHLORIDE CRYS ER 20 MEQ PO TBCR
40.0000 meq | EXTENDED_RELEASE_TABLET | Freq: Two times a day (BID) | ORAL | Status: AC
  Administered 2017-04-19 (×2): 40 meq via ORAL
  Filled 2017-04-19: qty 2

## 2017-04-19 MED ORDER — PROMETHAZINE HCL 25 MG/ML IJ SOLN
25.0000 mg | Freq: Once | INTRAMUSCULAR | Status: AC
  Administered 2017-04-19: 25 mg via INTRAVENOUS
  Filled 2017-04-19: qty 1

## 2017-04-19 NOTE — Progress Notes (Signed)
PT Cancellation Note  Patient Details Name: [name] MRN: [id number] DOB: 1956-02-16   Cancelled Treatment:    Reason Eval/Treat Not Completed: Attempted PT eval-daughter politely declined PT eval at this time. Pt was recently medicated with Ativan and daughter preferred therapy check back another time.    Weston Anna, MPT Pager: [phone]

## 2017-04-19 NOTE — Progress Notes (Signed)
PROGRESS NOTE    [name]  TKZ:[id number] DOB: 01-30-1956 DOA: 04/16/2017 PCP: Damaris Hippo, MD    Brief Narrative:  61 y.o. female with medical history significant of DVT on xarelto, colorectal cancer (s/p of chemo and radiation), stroke, asthma, GERD, anxiety, polyneuropathy, who presents with nausea, vomiting, abdominal pain.  Pt states that she started having nausea, vomiting and abdominal pain at about 2:30 PM. She vomited 5-6 times without blood in the vomitus. Her abdominal pain is located in the middle abdomen, 4/10 in severity, sharp, nonradiating. Patient does not have diarrhea, fever or chills. Patient denies symptoms of UTI.  Per her daughter, patient has chronic mild SOB sometimes, which has not changed. Currently no shortness breath, chest pain, cough. No unilateral weakness. Pt recently had left hip surgery on 03/25/17, currently she is taking Norco, Robaxin and doxycycline for possible left hip infection. Per her daughter, pt did not miss doses of xarelto. Pt is mildly confused. Per EDP, pt has "catatonic behavior in ED". Per her daughter, the patient has had similar episodes in the past. Per nurse the patient has been screaming "I cannot be in this room". Per her daughter, tiny amount of blood in her stool was noted when he wipes  Assessment & Plan:   Principal Problem:   Abdominal pain Active Problems:   DVT (deep venous thrombosis) (HCC)   Asthma   GERD (gastroesophageal reflux disease)   Nausea & vomiting   Colorectal cancer (HCC)   Rectal bleeding   Acute encephalopathy   Cancer (HCC)  Abdominal pain, nausea, vomiting:  - Outside records pending. Family present in room who could provide history. Patient reported has hx of stage 3 colon cancer s/p chemo and radiation in 2016. Per daughter, pt was noted to be in remission and since then, lost to follow up.  - CT scan personally reviewed. 2-3cm rectal mass noted, as well as L5 lesion and multiple nodes -  Imaging also demonstrates stool throughout the colon from ascending through rectal vault. Suspect constipation is more likely etiology for presenting abdominal pain, distension, nausea, decreased appetite - Limited results with SMOG enema. Continue cathartics as needed - Have started trial of scheduled IV reglan  Colon rectal cancer:  - Reportedly s/p of chemotherapy and radiation therapy. - Pt now with concerns of metastatic disease as per above - Consulted Oncology, who recommended GI consult for biopsy to be obtained endoscopically - Appreciate assistance by GI. Pt is currently in endoscopy suite. - Have been unable to obtain MRI x 2  DVT (deep venous thrombosis) ([place]): -Will continue Xarelto as tolerated - There are no signs of active bleed - Remains stable  Asthma:  -currently currently -prn albuterol nebs  GERD: -Protonix has been continued  Acute encephalopathy:  -Had required Haldol and one dose of narcan in ED at time of admission.  -appears to be stable at this time  Rectal bleeding:  - small amounts of blood reported by family member - Patient continue her Xarelto now - Will recheck cbc in AM  Elevated blood pressure:  - Without prior hx of hypertension.  - improved, however still suboptimally controlled -Will continue with IVF hydralazine when necessary  DVT prophylaxis: Xarelto Code Status: Full Family Communication: Pt in room, family at bedside Disposition Plan: Uncertain at this time  Consultants:   GI   Oncology  Procedures:   Sigmoidoscopy 4/26  Antimicrobials: Anti-infectives    Start     Dose/Rate Route Frequency Ordered Stop   04/17/17  1000  doxycycline (VIBRA-TABS) tablet 100 mg     100 mg Oral 2 times daily 04/17/17 0402        Subjective: Still complaining of nausea  Objective: Vitals:   04/18/17 1636 04/18/17 2119 04/19/17 0636 04/19/17 1346  BP:  (!) 161/76 (!) 168/85 (!) 157/89  Pulse:  90 86 (!) 123  Resp:  20 20  20   Temp:  98.7 F (37.1 C)  98.3 F (36.8 C)  TempSrc:  Oral Oral Oral  SpO2:  99% 100% 100%  Height: 5' (1.524 m)       Intake/Output Summary (Last 24 hours) at 04/19/17 1639 Last data filed at 04/19/17 1500  Gross per 24 hour  Intake          1721.67 ml  Output                0 ml  Net          1721.67 ml   There were no vitals filed for this visit.  Examination:  General exam: awake, conversant, in nad Respiratory system: normal chest rise, no wheezing on auscultation Cardiovascular system: regular rhythm, s1, s2 Gastrointestinal system: generally tender, mildly distended, pos BS Central nervous system: no seizures, no tremors Extremities: no cyanosis, no joint deformities Skin: no rashes, no pallor Psychiatry: affect normal// no auditory hallucinations  Data Reviewed: I have personally reviewed following labs and imaging studies  CBC:  Recent Labs Lab 04/16/17 2123 04/17/17 0641 04/17/17 0941 04/18/17 0624 04/19/17 0551  WBC 9.6 10.1 10.2 10.5 10.3  NEUTROABS 8.4*  --   --   --   --   HGB 11.4* 11.1* 11.2* 12.5 12.0  HCT 33.7* 33.0* 32.5* 36.4 34.8*  MCV 79.7 79.3 80.4 80.5 80.4  PLT 470* 484* 448* 510* 244*   Basic Metabolic Panel:  Recent Labs Lab 04/16/17 2123 04/17/17 0638 04/18/17 0624 04/19/17 0551  NA 139 139 138 135  K 3.5 2.8* 3.0* 2.7*  CL 106 104 101 102  CO2 23 23 24 23   GLUCOSE 149* 135* 106* 102*  BUN 5* <5* <5* <5*  CREATININE 0.57 0.49 0.51 0.47  CALCIUM 9.3 9.3 9.4 8.9  MG  --   --  1.9  --    GFR: CrCl cannot be calculated (Unknown ideal weight.). Liver Function Tests:  Recent Labs Lab 04/16/17 2123  AST 18  ALT 9*  ALKPHOS 102  BILITOT 0.4  PROT 7.9  ALBUMIN 4.0    Recent Labs Lab 04/16/17 2123  LIPASE 17    Recent Labs Lab 04/17/17 0638  AMMONIA 11   Coagulation Profile:  Recent Labs Lab 04/17/17 0638  INR 1.18   Cardiac Enzymes: No results for input(s): CKTOTAL, CKMB, CKMBINDEX, TROPONINI in  the last 168 hours. BNP (last 3 results) No results for input(s): PROBNP in the last 8760 hours. HbA1C: No results for input(s): HGBA1C in the last 72 hours. CBG:  Recent Labs Lab 04/17/17 0840 04/18/17 0807 04/19/17 0659  GLUCAP 116* 95 91   Lipid Profile: No results for input(s): CHOL, HDL, LDLCALC, TRIG, CHOLHDL, LDLDIRECT in the last 72 hours. Thyroid Function Tests: No results for input(s): TSH, T4TOTAL, FREET4, T3FREE, THYROIDAB in the last 72 hours. Anemia Panel: No results for input(s): VITAMINB12, FOLATE, FERRITIN, TIBC, IRON, RETICCTPCT in the last 72 hours. Sepsis Labs: No results for input(s): PROCALCITON, LATICACIDVEN in the last 168 hours.  Recent Results (from the past 240 hour(s))  Urine culture     Status:  Abnormal   Collection Time: 04/16/17  9:24 PM  Result Value Ref Range Status   Specimen Description URINE, RANDOM  Final   Special Requests NONE  Final   Culture MULTIPLE SPECIES PRESENT, SUGGEST RECOLLECTION (A)  Final   Report Status 04/18/2017 FINAL  Final     Radiology Studies: Dg Abd Portable 1v  Result Date: 04/19/2017 CLINICAL DATA:  Nausea vomiting for 2 days. History of colorectal cancer. EXAM: PORTABLE ABDOMEN - 1 VIEW COMPARISON:  CT of the abdomen pelvis 04/17/2017 FINDINGS: The bowel gas pattern is normal. Scattered radiodense material throughout the colon and stomach. Small amount of formed stool in the right colon and proximal transverse colon. Left hip prosthesis noted. IMPRESSION: Nonobstructive bowel gas pattern. Scattered radiodense material throughout the colon and stomach, likely within GI contents. Electronically Signed   By: Fidela Salisbury M.D.   On: 04/19/2017 11:48    Scheduled Meds: . docusate sodium  100 mg Oral BID  . doxycycline  100 mg Oral BID  . Efinaconazole  1 application Apply externally Daily  . ferrous sulfate  325 mg Oral TID WC  . metoCLOPramide (REGLAN) injection  5 mg Intravenous Q8H  . pantoprazole  40 mg  Oral Daily  . potassium chloride  40 mEq Oral BID  . rivaroxaban  20 mg Oral Q supper  . saccharomyces boulardii  250 mg Oral Daily  . sodium chloride flush  3 mL Intravenous Q12H   Continuous Infusions: . sodium chloride 100 mL/hr at 04/19/17 0547     LOS: 2 days   Rondal Vandevelde, Orpah Melter, MD [facility] Pager [phone]  If 7PM-7AM, please contact night-coverage [email or web address] Password [facility] 04/19/2017, 4:39 PM

## 2017-04-19 NOTE — Progress Notes (Signed)
CRITICAL VALUE ALERT  Critical value received:  K+ 2.7  Date of notification:  04/19/17  Time of notification:  0800  Critical value read back:Yes.    Nurse who received alert:  Star Age  MD notified (1st page):  Dr Wyline Copas  Time of first page:  0801  MD notified (2nd page):  Time of second page:  Responding MD:  Dr Wyline Copas  Time MD responded:  [phone]

## 2017-04-19 NOTE — Progress Notes (Signed)
OT Cancellation Note  Patient Details Name: [name] MRN: [id number] DOB: [birth year]   Cancelled Treatment:     Pt refused due to stomach pain.  Will check on again as schedule allows. Kari Baars, [place]  Payton Mccallum D 04/19/2017, 10:12 AM

## 2017-04-20 LAB — BASIC METABOLIC PANEL
Anion gap: 10 (ref 5–15)
BUN: 6 mg/dL (ref 6–20)
CALCIUM: 8.7 mg/dL — AB (ref 8.9–10.3)
CO2: 22 mmol/L (ref 22–32)
CREATININE: 0.55 mg/dL (ref 0.44–1.00)
Chloride: 104 mmol/L (ref 101–111)
GFR calc Af Amer: >60 mL/min (ref >60)
GFR calc non Af Amer: >60 mL/min (ref >60)
GLUCOSE: 74 mg/dL (ref 65–99)
Potassium: 3 mmol/L — ABNORMAL LOW (ref 3.5–5.1)
Sodium: 136 mmol/L (ref 135–145)

## 2017-04-20 LAB — GLUCOSE, CAPILLARY
Glucose-Capillary: 68 mg/dL (ref 65–99)
Glucose-Capillary: 86 mg/dL (ref 65–99)

## 2017-04-20 MED ORDER — METOCLOPRAMIDE HCL 5 MG/ML IJ SOLN
10.0000 mg | Freq: Three times a day (TID) | INTRAMUSCULAR | Status: DC
  Administered 2017-04-20 – 2017-04-23 (×9): 10 mg via INTRAVENOUS
  Filled 2017-04-20 (×9): qty 2

## 2017-04-20 NOTE — Progress Notes (Signed)
PROGRESS NOTE    [name]  TIW:[id number] DOB: Dec 18, 1956 DOA: 04/16/2017 PCP: Damaris Hippo, MD    Brief Narrative:  61 y.o. female with medical history significant of DVT on xarelto, colorectal cancer (s/p of chemo and radiation), stroke, asthma, GERD, anxiety, polyneuropathy, who presents with nausea, vomiting, abdominal pain.  Pt states that she started having nausea, vomiting and abdominal pain at about 2:30 PM. She vomited 5-6 times without blood in the vomitus. Her abdominal pain is located in the middle abdomen, 4/10 in severity, sharp, nonradiating. Patient does not have diarrhea, fever or chills. Patient denies symptoms of UTI.  Per her daughter, patient has chronic mild SOB sometimes, which has not changed. Currently no shortness breath, chest pain, cough. No unilateral weakness. Pt recently had left hip surgery on 03/25/17, currently she is taking Norco, Robaxin and doxycycline for possible left hip infection. Per her daughter, pt did not miss doses of xarelto. Pt is mildly confused. Per EDP, pt has "catatonic behavior in ED". Per her daughter, the patient has had similar episodes in the past. Per nurse the patient has been screaming "I cannot be in this room". Per her daughter, tiny amount of blood in her stool was noted when he wipes  Assessment & Plan:   Principal Problem:   Abdominal pain Active Problems:   DVT (deep venous thrombosis) (HCC)   Asthma   GERD (gastroesophageal reflux disease)   Nausea & vomiting   Colorectal cancer (HCC)   Rectal bleeding   Acute encephalopathy   Cancer (HCC)  Abdominal pain, nausea, vomiting:  - Outside records pending. Family present in room who could provide history. Patient reported has hx of stage 3 colon cancer s/p chemo and radiation in 2016. Per daughter, pt was noted to be in remission and since then, lost to follow up.  - CT scan personally reviewed. 2-3cm rectal mass noted, as well as L5 lesion and multiple nodes -  Imaging also demonstrates stool throughout the colon from ascending through rectal vault. Suspect constipation is more likely etiology for presenting abdominal pain, distension, nausea, decreased appetite - Limited results with SMOG enema. Continue cathartics as needed - Currently on scheduled reglan. Still feeling nauseated. Will increase dose to 10mg  q8hrs  Colon rectal cancer:  - Reportedly s/p of chemotherapy and radiation therapy. - Pt now with concerns of metastatic disease as per above - Consulted Oncology, who recommended GI consult for biopsy to be obtained endoscopically - Appreciate assistance by GI. Pt is s/p endoscopy, findings of radiation changes - Have been unable to obtain MRI x 2. Will discuss with Oncology  DVT (deep venous thrombosis) ([place]): -Will continue Xarelto as tolerated - There are no signs of active bleed - Currently remains stable  Asthma:  -currently currently -continue with PRN nebs  GERD: -Protonix continued  Acute encephalopathy:  -Had required Haldol and one dose of narcan in ED at time of admission.  -currently stable  Rectal bleeding:  - small amounts of blood reported by family member - Patient continue her Xarelto now - CBC has remained stable  Elevated blood pressure:  - Without prior hx of hypertension.  - improved, however still suboptimally controlled -Patient continued on IVF hydralazine as needed  DVT prophylaxis: Xarelto Code Status: Full Family Communication: Pt in room, family at bedside Disposition Plan: Uncertain at this time  Consultants:   GI   Oncology  Procedures:   Sigmoidoscopy 4/26  Antimicrobials: Anti-infectives    Start     Dose/Rate Route  Frequency Ordered Stop   04/17/17 1000  doxycycline (VIBRA-TABS) tablet 100 mg     100 mg Oral 2 times daily 04/17/17 0402        Subjective: Complaining of continued nausea  Objective: Vitals:   04/19/17 1747 04/20/17 0003 04/20/17 0651 04/20/17 1327    BP:  (!) 147/88 (!) 149/87 (!) 181/88  Pulse:  (!) 109 (!) 118 (!) 105  Resp:  20 16 16   Temp:  99.5 F (37.5 C) 98.8 F (37.1 C) 99.4 F (37.4 C)  TempSrc:  Axillary Oral Oral  SpO2:  97% 100% 98%  Weight: 65.3 kg (143 lb 15.4 oz)     Height:        Intake/Output Summary (Last 24 hours) at 04/20/17 1404 Last data filed at 04/19/17 1500  Gross per 24 hour  Intake           921.67 ml  Output                0 ml  Net           921.67 ml   Filed Weights   04/19/17 1747  Weight: 65.3 kg (143 lb 15.4 oz)    Examination:  General exam: Laying in bed, awake, in nad Respiratory system: normal resp effort, no wheezing on auscultation Cardiovascular system: regular rate, s1-2 on auscultation Gastrointestinal system: soft, nondistended, pos BS Central nervous system: cn2-12 grossly intact, strength intact Extremities: perfused, no clubbing Skin: normal skin turgor, no notable skin lesions seen Psychiatry: mood normal// no visual hallucinations  Data Reviewed: I have personally reviewed following labs and imaging studies  CBC:  Recent Labs Lab 04/16/17 2123 04/17/17 0641 04/17/17 0941 04/18/17 0624 04/19/17 0551  WBC 9.6 10.1 10.2 10.5 10.3  NEUTROABS 8.4*  --   --   --   --   HGB 11.4* 11.1* 11.2* 12.5 12.0  HCT 33.7* 33.0* 32.5* 36.4 34.8*  MCV 79.7 79.3 80.4 80.5 80.4  PLT 470* 484* 448* 510* 563*   Basic Metabolic Panel:  Recent Labs Lab 04/16/17 2123 04/17/17 0638 04/18/17 0624 04/19/17 0551 04/20/17 0536  NA 139 139 138 135 136  K 3.5 2.8* 3.0* 2.7* 3.0*  CL 106 104 101 102 104  CO2 23 23 24 23 22   GLUCOSE 149* 135* 106* 102* 74  BUN 5* <5* <5* <5* 6  CREATININE 0.57 0.49 0.51 0.47 0.55  CALCIUM 9.3 9.3 9.4 8.9 8.7*  MG  --   --  1.9  --   --    GFR: Estimated Creatinine Clearance: 63.8 mL/min (by C-G formula based on SCr of 0.55 mg/dL). Liver Function Tests:  Recent Labs Lab 04/16/17 2123  AST 18  ALT 9*  ALKPHOS 102  BILITOT 0.4  PROT  7.9  ALBUMIN 4.0    Recent Labs Lab 04/16/17 2123  LIPASE 17    Recent Labs Lab 04/17/17 0638  AMMONIA 11   Coagulation Profile:  Recent Labs Lab 04/17/17 0638  INR 1.18   Cardiac Enzymes: No results for input(s): CKTOTAL, CKMB, CKMBINDEX, TROPONINI in the last 168 hours. BNP (last 3 results) No results for input(s): PROBNP in the last 8760 hours. HbA1C: No results for input(s): HGBA1C in the last 72 hours. CBG:  Recent Labs Lab 04/17/17 0840 04/18/17 0807 04/19/17 0659 04/20/17 0651 04/20/17 0733  GLUCAP 116* 95 91 68 86   Lipid Profile: No results for input(s): CHOL, HDL, LDLCALC, TRIG, CHOLHDL, LDLDIRECT in the last 72 hours. Thyroid Function  Tests: No results for input(s): TSH, T4TOTAL, FREET4, T3FREE, THYROIDAB in the last 72 hours. Anemia Panel: No results for input(s): VITAMINB12, FOLATE, FERRITIN, TIBC, IRON, RETICCTPCT in the last 72 hours. Sepsis Labs: No results for input(s): PROCALCITON, LATICACIDVEN in the last 168 hours.  Recent Results (from the past 240 hour(s))  Urine culture     Status: Abnormal   Collection Time: 04/16/17  9:24 PM  Result Value Ref Range Status   Specimen Description URINE, RANDOM  Final   Special Requests NONE  Final   Culture MULTIPLE SPECIES PRESENT, SUGGEST RECOLLECTION (A)  Final   Report Status 04/18/2017 FINAL  Final     Radiology Studies: Dg Abd Portable 1v  Result Date: 04/19/2017 CLINICAL DATA:  Nausea vomiting for 2 days. History of colorectal cancer. EXAM: PORTABLE ABDOMEN - 1 VIEW COMPARISON:  CT of the abdomen pelvis 04/17/2017 FINDINGS: The bowel gas pattern is normal. Scattered radiodense material throughout the colon and stomach. Small amount of formed stool in the right colon and proximal transverse colon. Left hip prosthesis noted. IMPRESSION: Nonobstructive bowel gas pattern. Scattered radiodense material throughout the colon and stomach, likely within GI contents. Electronically Signed   By: Fidela Salisbury M.D.   On: 04/19/2017 11:48    Scheduled Meds: . docusate sodium  100 mg Oral BID  . doxycycline  100 mg Oral BID  . Efinaconazole  1 application Apply externally Daily  . ferrous sulfate  325 mg Oral TID WC  . metoCLOPramide (REGLAN) injection  10 mg Intravenous Q8H  . pantoprazole  40 mg Oral Daily  . rivaroxaban  20 mg Oral Q supper  . saccharomyces boulardii  250 mg Oral Daily  . sodium chloride flush  3 mL Intravenous Q12H   Continuous Infusions: . sodium chloride 100 mL/hr at 04/20/17 1321     LOS: 3 days   CHIU, Orpah Melter, MD [facility] Pager [phone]  If 7PM-7AM, please contact night-coverage [email or web address] Password [email or web address] 04/20/2017, 2:04 PM

## 2017-04-20 NOTE — Evaluation (Signed)
Physical Therapy Evaluation Patient Details Name: [name] MRN: [id number] DOB: 09/05/1956 Today's Date: 04/20/2017   History of Present Illness  61 y.o.female who presents with nausea, vomiting, abdominal pain. CT scan showing rectal mass and L5 lesion. PMH: DVT on xarelto, colorectalcancer (s/p of chemo and radiation), stroke, asthma, GERD, anxiety, polyneuropathy, Lt direct anterior THA 03/25/17.   Clinical Impression  Pt admitted with above diagnosis. Pt currently with functional limitations due to the deficits listed below (see PT Problem List). Mobility is limited, requiring mod assist with bed mobility and transfers. Pt able to stand at EOB but unable to ambulate. Pt c/o nausea throughout session and generalized weakness. Pt hoping to be able to return home with her daughter to assist. Pt will need to improve her overall mobility and activity tolerance to return home. PT to continue to follow, with modifications to recommendations as needed.     Follow Up Recommendations Home health PT;Supervision/Assistance - 24 hour    Equipment Recommendations  None recommended by PT    Recommendations for Other Services       Precautions / Restrictions Precautions Precautions: Fall Precaution Comments: recent Lt DA THA Restrictions Weight Bearing Restrictions: No      Mobility  Bed Mobility Overal bed mobility: Needs Assistance Bed Mobility: Supine to Sit;Sit to Supine     Supine to sit: Mod assist Sit to supine: Mod assist   General bed mobility comments: HOB elevated 20 degrees  Transfers Overall transfer level: Needs assistance Equipment used: Rolling walker (2 wheeled) Transfers: Sit to/from Stand Sit to Stand: Mod assist;From elevated surface         General transfer comment: limited standing tolerance due to reports of fatigue.   Ambulation/Gait             General Gait Details: pt reports being unable to attempt due to fatigue and nausea  Stairs             Wheelchair Mobility    Modified Rankin (Stroke Patients Only)       Balance Overall balance assessment: Needs assistance Sitting-balance support: No upper extremity supported Sitting balance-Leahy Scale: Fair     Standing balance support: Bilateral upper extremity supported Standing balance-Leahy Scale: Poor Standing balance comment: requires RW                             Pertinent Vitals/Pain Pain Assessment: No/denies pain    Home Living Family/patient expects to be discharged to:: Private residence Living Arrangements: Children Available Help at Discharge: Family;Available 24 hours/day Type of Home: House Home Access: Stairs to enter Entrance Stairs-Rails: None Entrance Stairs-Number of Steps: 3 Home Layout: One level Home Equipment: Wheelchair - [employer] - 2 wheels;Bedside commode Additional Comments: patient's daughter lives with her, dtr works from home.     Prior Function Level of Independence: Needs assistance   Gait / Transfers Assistance Needed: walking with rw  ADL's / Homemaking Assistance Needed: dtr assisting with dressing and bathing.        Hand Dominance        Extremity/Trunk Assessment   Upper Extremity Assessment Upper Extremity Assessment: Generalized weakness    Lower Extremity Assessment Lower Extremity Assessment: Generalized weakness       Communication   Communication: No difficulties  Cognition Arousal/Alertness: Lethargic Behavior During Therapy: Flat affect Overall Cognitive Status: No family/caregiver present to determine baseline cognitive functioning  General Comments: pt slow to respond to questions and commands. Responses are appropriate. C/o nausea during session.       General Comments      Exercises Total Joint Exercises Ankle Circles/Pumps: AROM;Both;10 reps;Supine Quad Sets: Strengthening;Left;10 reps Heel Slides: AAROM;Left;10  reps Hip ABduction/ADduction: AAROM;Left;10 reps   Assessment/Plan    PT Assessment Patient needs continued PT services  PT Problem List Decreased strength;Decreased range of motion;Decreased activity tolerance;Decreased balance;Decreased mobility       PT Treatment Interventions DME instruction;Gait training;Stair training;Functional mobility training;Therapeutic activities;Balance training;Therapeutic exercise;Patient/family education    PT Goals (Current goals can be found in the Care Plan section)  Acute Rehab PT Goals Patient Stated Goal: get back home, get rid of nausea PT Goal Formulation: With patient Time For Goal Achievement: 05/04/17 Potential to Achieve Goals: Good    Frequency Min 3X/week   Barriers to discharge        Co-evaluation               End of Session Equipment Utilized During Treatment: Gait belt Activity Tolerance: Patient limited by fatigue (nausea) Patient left: in bed;with call bell/phone within reach;with bed alarm set Nurse Communication: Mobility status PT Visit Diagnosis: Muscle weakness (generalized) (M62.81);Difficulty in walking, not elsewhere classified (R26.2)    Time: 9528-4132 PT Time Calculation (min) (ACUTE ONLY): 43 min   Charges:   PT Evaluation $PT Eval Moderate Complexity: 1 Procedure PT Treatments $Therapeutic Exercise: 8-22 mins $Therapeutic Activity: 8-22 mins   PT G Codes:        Cassell Clement, PT, CSCS Pager [phone] [place] 04/20/2017, 9:26 AM

## 2017-04-21 ENCOUNTER — Encounter (HOSPITAL_COMMUNITY): Payer: Self-pay | Admitting: Gastroenterology

## 2017-04-21 DIAGNOSIS — R110 Nausea: Secondary | ICD-10-CM

## 2017-04-21 DIAGNOSIS — R630 Anorexia: Secondary | ICD-10-CM

## 2017-04-21 DIAGNOSIS — R634 Abnormal weight loss: Secondary | ICD-10-CM

## 2017-04-21 LAB — BASIC METABOLIC PANEL
ANION GAP: 10 (ref 5–15)
BUN: <5 mg/dL — ABNORMAL LOW (ref 6–20)
CHLORIDE: 103 mmol/L (ref 101–111)
CO2: 23 mmol/L (ref 22–32)
Calcium: 8.8 mg/dL — ABNORMAL LOW (ref 8.9–10.3)
Creatinine, Ser: 0.42 mg/dL — ABNORMAL LOW (ref 0.44–1.00)
GFR calc Af Amer: >60 mL/min (ref >60)
GLUCOSE: 93 mg/dL (ref 65–99)
POTASSIUM: 2.9 mmol/L — AB (ref 3.5–5.1)
Sodium: 136 mmol/L (ref 135–145)

## 2017-04-21 LAB — GLUCOSE, CAPILLARY: Glucose-Capillary: 94 mg/dL (ref 65–99)

## 2017-04-21 MED ORDER — POTASSIUM CHLORIDE CRYS ER 20 MEQ PO TBCR
40.0000 meq | EXTENDED_RELEASE_TABLET | Freq: Two times a day (BID) | ORAL | Status: AC
  Administered 2017-04-21 (×2): 40 meq via ORAL
  Filled 2017-04-21 (×2): qty 2

## 2017-04-21 NOTE — Care Management Note (Signed)
Case Management Note  Patient Details  Name: [name] MRN: [id number] Date of Birth: [birth year]  Subjective/Objective: Spoe to patient/dtr in rm about d/c plans-they were already using [facility]-rep Tim aware of HHPT/OT-await final [place] orders. Patient already has rw,cane,3n1.                     Action/Plan:d/c home w/HHC.   Expected Discharge Date:                  Expected Discharge Plan:  [place]  In-House Referral:     Discharge planning Services  CM Consult  Post Acute Care Choice:  [place] ([facility], has rw,cane,3n1) Choice offered to:     DME Arranged:    DME Agency:     HH Arranged:    [place] Agency:  [facility] (formerly [employer])  Status of Service:  In process, will continue to follow  If discussed at Long Length of Stay Meetings, dates discussed:    Additional Comments:  Dessa Phi, RN 04/21/2017, 12:40 PM

## 2017-04-21 NOTE — Progress Notes (Signed)
PT Cancellation Note  Patient Details Name: [name] MRN: [id number] DOB: October 03, 1956   Cancelled Treatment:     pt feeling "too tired" for any OOB activity right now.  Will attempt to see another day.   Rica Koyanagi  PTA [facility]  Acute  Rehab Pager      [phone]

## 2017-04-21 NOTE — Progress Notes (Signed)
PROGRESS NOTE    [name]  DTO:[id number] DOB: [birth year] DOA: 04/16/2017 PCP: Damaris Hippo, MD    Brief Narrative:  61 y.o. female with medical history significant of DVT on xarelto, colorectal cancer (s/p of chemo and radiation), stroke, asthma, GERD, anxiety, polyneuropathy, who presents with nausea, vomiting, abdominal pain.  Pt states that she started having nausea, vomiting and abdominal pain at about 2:30 PM. She vomited 5-6 times without blood in the vomitus. Her abdominal pain is located in the middle abdomen, 4/10 in severity, sharp, nonradiating. Patient does not have diarrhea, fever or chills. Patient denies symptoms of UTI.  Per her daughter, patient has chronic mild SOB sometimes, which has not changed. Currently no shortness breath, chest pain, cough. No unilateral weakness. Pt recently had left hip surgery on 03/25/17, currently she is taking Norco, Robaxin and doxycycline for possible left hip infection. Per her daughter, pt did not miss doses of xarelto. Pt is mildly confused. Per EDP, pt has "catatonic behavior in ED". Per her daughter, the patient has had similar episodes in the past. Per nurse the patient has been screaming "I cannot be in this room". Per her daughter, tiny amount of blood in her stool was noted when he wipes  Assessment & Plan:   Principal Problem:   Abdominal pain Active Problems:   DVT (deep venous thrombosis) (HCC)   Asthma   GERD (gastroesophageal reflux disease)   Nausea & vomiting   Colorectal cancer (HCC)   Rectal bleeding   Acute encephalopathy   Cancer (HCC)  Abdominal pain, nausea, vomiting:  - Outside records pending. Family present in room who could provide history. Patient reported has hx of stage 3 colon cancer s/p chemo and radiation in 2016. Per daughter, pt was noted to be in remission and since then, lost to follow up.  - CT scan personally reviewed. 2-3cm rectal mass noted, as well as L5 lesion and multiple nodes -  Imaging initially demonstrated stool throughout the colon per my own read - Limited results with SMOG enema with continued cathartics as needed - Currently on scheduled reglan. Still feeling nauseated. - Have ordered gastric emptying study - If normal, consider formal GI consultation  Colon rectal cancer:  - Reportedly s/p of chemotherapy and radiation therapy. - Pt now with concerns of metastatic disease as per above - Consulted Oncology, who recommended GI consult for biopsy to be obtained endoscopically - Appreciate assistance by GI. Pt is s/p endoscopy, findings of radiation changes - Have been unable to obtain MRI x 2. Discussed with Oncology with recs for node biopsy instead - IR consulted  DVT (deep venous thrombosis) ([place]): -Will continue Xarelto as tolerated - There are no signs of active bleed - Currently remains stable  Asthma:  -currently stable -Will continue with PRN nebs  GERD: -Protonix continued - Stable at present  Acute encephalopathy:  -Patient had required Haldol and one dose of narcan in ED at time of admission.  -currently stable   Rectal bleeding:  - small amounts of blood reported by family member - Patient continue her Xarelto now - CBC has remained stable. Overall stable at this time  Elevated blood pressure:  - Without prior hx of hypertension.  - improved, however still suboptimally controlled -Patient continued on IVF hydralazine as needed - Currently stable  DVT prophylaxis: Xarelto Code Status: Full Family Communication: Pt in room, family at bedside Disposition Plan: Uncertain at this time  Consultants:   GI   Oncology  Procedures:  Sigmoidoscopy 4/26  Antimicrobials: Anti-infectives    Start     Dose/Rate Route Frequency Ordered Stop   04/17/17 1000  doxycycline (VIBRA-TABS) tablet 100 mg     100 mg Oral 2 times daily 04/17/17 0402        Subjective: Reporting continued nausea  Objective: Vitals:    04/20/17 1327 04/20/17 2200 04/21/17 0541 04/21/17 1444  BP: (!) 181/88 (!) 168/89 (!) 155/95 (!) 159/100  Pulse: (!) 105 (!) 105 (!) 109 93  Resp: 16 17 16 17   Temp: 99.4 F (37.4 C) 97.6 F (36.4 C) 98.7 F (37.1 C) 98.9 F (37.2 C)  TempSrc: Oral Oral Oral Oral  SpO2: 98% 100% 100% 99%  Weight:      Height:        Intake/Output Summary (Last 24 hours) at 04/21/17 1610 Last data filed at 04/21/17 0200  Gross per 24 hour  Intake             3500 ml  Output                0 ml  Net             3500 ml   Filed Weights   04/19/17 1747  Weight: 65.3 kg (143 lb 15.4 oz)    Examination:  General exam: conversant, awake, laying in bed Respiratory system: normal chest rise, no audible wheezing Cardiovascular system: regular rhythm, s1, s2 on auscultation Gastrointestinal system: nontender, pos BS, nondistended Central nervous system: no seizures, no tremors Extremities: no cyanosis, no joint deformities Skin: no rashes, no notable skin lesions seen Psychiatry: affect normal// no auditory hallucinations  Data Reviewed: I have personally reviewed following labs and imaging studies  CBC:  Recent Labs Lab 04/16/17 2123 04/17/17 0641 04/17/17 0941 04/18/17 0624 04/19/17 0551  WBC 9.6 10.1 10.2 10.5 10.3  NEUTROABS 8.4*  --   --   --   --   HGB 11.4* 11.1* 11.2* 12.5 12.0  HCT 33.7* 33.0* 32.5* 36.4 34.8*  MCV 79.7 79.3 80.4 80.5 80.4  PLT 470* 484* 448* 510* 400*   Basic Metabolic Panel:  Recent Labs Lab 04/17/17 0638 04/18/17 0624 04/19/17 0551 04/20/17 0536 04/21/17 0525  NA 139 138 135 136 136  K 2.8* 3.0* 2.7* 3.0* 2.9*  CL 104 101 102 104 103  CO2 23 24 23 22 23   GLUCOSE 135* 106* 102* 74 93  BUN <5* <5* <5* 6 <5*  CREATININE 0.49 0.51 0.47 0.55 0.42*  CALCIUM 9.3 9.4 8.9 8.7* 8.8*  MG  --  1.9  --   --   --    GFR: Estimated Creatinine Clearance: 63.8 mL/min (A) (by C-G formula based on SCr of 0.42 mg/dL (L)). Liver Function Tests:  Recent  Labs Lab 04/16/17 2123  AST 18  ALT 9*  ALKPHOS 102  BILITOT 0.4  PROT 7.9  ALBUMIN 4.0    Recent Labs Lab 04/16/17 2123  LIPASE 17    Recent Labs Lab 04/17/17 0638  AMMONIA 11   Coagulation Profile:  Recent Labs Lab 04/17/17 0638  INR 1.18   Cardiac Enzymes: No results for input(s): CKTOTAL, CKMB, CKMBINDEX, TROPONINI in the last 168 hours. BNP (last 3 results) No results for input(s): PROBNP in the last 8760 hours. HbA1C: No results for input(s): HGBA1C in the last 72 hours. CBG:  Recent Labs Lab 04/18/17 0807 04/19/17 0659 04/20/17 0651 04/20/17 0733 04/21/17 0812  GLUCAP 95 91 68 86 94   Lipid Profile:  No results for input(s): CHOL, HDL, LDLCALC, TRIG, CHOLHDL, LDLDIRECT in the last 72 hours. Thyroid Function Tests: No results for input(s): TSH, T4TOTAL, FREET4, T3FREE, THYROIDAB in the last 72 hours. Anemia Panel: No results for input(s): VITAMINB12, FOLATE, FERRITIN, TIBC, IRON, RETICCTPCT in the last 72 hours. Sepsis Labs: No results for input(s): PROCALCITON, LATICACIDVEN in the last 168 hours.  Recent Results (from the past 240 hour(s))  Urine culture     Status: Abnormal   Collection Time: 04/16/17  9:24 PM  Result Value Ref Range Status   Specimen Description URINE, RANDOM  Final   Special Requests NONE  Final   Culture MULTIPLE SPECIES PRESENT, SUGGEST RECOLLECTION (A)  Final   Report Status 04/18/2017 FINAL  Final     Radiology Studies: No results found.  Scheduled Meds: . docusate sodium  100 mg Oral BID  . doxycycline  100 mg Oral BID  . ferrous sulfate  325 mg Oral TID WC  . metoCLOPramide (REGLAN) injection  10 mg Intravenous Q8H  . pantoprazole  40 mg Oral Daily  . potassium chloride  40 mEq Oral BID  . saccharomyces boulardii  250 mg Oral Daily  . sodium chloride flush  3 mL Intravenous Q12H   Continuous Infusions: . sodium chloride 100 mL/hr at 04/21/17 1603     LOS: 4 days   CHIU, Orpah Melter, MD [facility] Pager [phone]  If 7PM-7AM, please contact night-coverage [email or web address] Password [email or web address] 04/21/2017, 4:10 PM

## 2017-04-21 NOTE — Progress Notes (Signed)
[name]   DOB:[birth year]   PJ#:[id number]   PYK#:[id number]  Medical oncology follow-up note  Subjective: I met pt and her daughter again around noon today. She attempted his MRI again over the weekend, but could not tolerate. She still quite nauseous, has been eating little.    Objective:  Vitals:   04/21/17 0541 04/21/17 1444  BP: (!) 155/95 (!) 159/100  Pulse: (!) 109 93  Resp: 16 17  Temp: 98.7 F (37.1 C) 98.9 F (37.2 C)    Body mass index is 28.12 kg/m.  Intake/Output Summary (Last 24 hours) at 04/21/17 2100 Last data filed at 04/21/17 1800  Gross per 24 hour  Intake             4700 ml  Output              900 ml  Net             3800 ml     Sclerae unicteric  Oropharynx clear  No peripheral adenopathy  Lungs clear -- no rales or rhonchi  Heart regular rate and rhythm  Abdomen benign  MSK no focal spinal tenderness, no peripheral edema  Neuro nonfocal   CBG (last 3)   Recent Labs  04/20/17 0651 04/20/17 0733 04/21/17 0812  GLUCAP 68 86 94     Labs:  Lab Results  Component Value Date   WBC 10.3 04/19/2017   HGB 12.0 04/19/2017   HCT 34.8 (L) 04/19/2017   MCV 80.4 04/19/2017   PLT 457 (H) 04/19/2017   NEUTROABS 8.4 (H) 04/16/2017   CMP Latest Ref Rng & Units 04/21/2017 04/20/2017 04/19/2017  Glucose 65 - 99 mg/dL 93 74 102(H)  BUN 6 - 20 mg/dL <5(L) 6 <5(L)  Creatinine 0.44 - 1.00 mg/dL 0.42(L) 0.55 0.47  Sodium 135 - 145 mmol/L 136 136 135  Potassium 3.5 - 5.1 mmol/L 2.9(L) 3.0(L) 2.7(LL)  Chloride 101 - 111 mmol/L 103 104 102  CO2 22 - 32 mmol/L 23 22 23   Calcium 8.9 - 10.3 mg/dL 8.8(L) 8.7(L) 8.9  Total Protein 6.5 - 8.1 g/dL - - -  Total Bilirubin 0.3 - 1.2 mg/dL - - -  Alkaline Phos 38 - 126 U/L - - -  AST 15 - 41 U/L - - -  ALT 14 - 54 U/L - - -     Urine Studies No results for input(s): UHGB, CRYS in the last 72 hours.  Invalid input(s): UACOL, UAPR, USPG, UPH, UTP, UGL, UKET, UBIL, UNIT, UROB, [place], UEPI, UWBC, [place], [place]  Basic Metabolic Panel:  Recent Labs Lab 04/17/17 [id number] 04/18/17 0624 04/19/17 0551 04/20/17 0536 04/21/17 0525  NA 139 138 135 136 136  K 2.8* 3.0* 2.7* 3.0* 2.9*  CL 104 101 102 104 103  CO2 23 24 23 22 23   GLUCOSE 135* 106* 102* 74 93  BUN <5* <5* <5* 6 <5*  CREATININE 0.49 0.51 0.47 0.55 0.42*  CALCIUM 9.3 9.4 8.9 8.7* 8.8*  MG  --  1.9  --   --   --    GFR Estimated Creatinine Clearance: 63.8 mL/min (A) (by C-G formula based on SCr of 0.42 mg/dL (L)). Liver Function Tests:  Recent Labs Lab 04/16/17 2123  AST 18  ALT 9*  ALKPHOS 102  BILITOT 0.4  PROT 7.9  ALBUMIN 4.0    Recent Labs Lab 04/16/17 2123  LIPASE 17    Recent Labs Lab 04/17/17 0638  AMMONIA 11  Coagulation profile  Recent Labs Lab 04/17/17 0638  INR 1.18    CBC:  Recent Labs Lab 04/16/17 2123 04/17/17 0641 04/17/17 0941 04/18/17 0624 04/19/17 0551  WBC 9.6 10.1 10.2 10.5 10.3  NEUTROABS 8.4*  --   --   --   --   HGB 11.4* 11.1* 11.2* 12.5 12.0  HCT 33.7* 33.0* 32.5* 36.4 34.8*  MCV 79.7 79.3 80.4 80.5 80.4  PLT 470* 484* 448* 510* 457*   Cardiac Enzymes: No results for input(s): CKTOTAL, CKMB, CKMBINDEX, TROPONINI in the last 168 hours. BNP: Invalid input(s): POCBNP CBG:  Recent Labs Lab 04/18/17 0807 04/19/17 0659 04/20/17 0651 04/20/17 0733 04/21/17 0812  GLUCAP 95 91 68 86 94   D-Dimer No results for input(s): DDIMER in the last 72 hours. Hgb A1c No results for input(s): HGBA1C in the last 72 hours. Lipid Profile No results for input(s): CHOL, HDL, LDLCALC, TRIG, CHOLHDL, LDLDIRECT in the last 72 hours. Thyroid function studies No results for input(s): TSH, T4TOTAL, T3FREE, THYROIDAB in the last 72 hours.  Invalid input(s): FREET3 Anemia work up No results for input(s): VITAMINB12, FOLATE, FERRITIN, TIBC, IRON, RETICCTPCT in the last 72 hours. Microbiology Recent Results (from the past 240 hour(s))  Urine culture     Status:  Abnormal   Collection Time: 04/16/17  9:24 PM  Result Value Ref Range Status   Specimen Description URINE, RANDOM  Final   Special Requests NONE  Final   Culture MULTIPLE SPECIES PRESENT, SUGGEST RECOLLECTION (A)  Final   Report Status 04/18/2017 FINAL  Final      Studies:  No results found.  Assessment: 61 y.o.  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 She has had a dose symptoms since her recent hip surgery on 04/21/2017.  1. History of rectal squamous cell carcinoma, status post concurrent chemotherapy and radiation in 2016 in [place] 2. Possible metastatic cancer to liver, node, pelvis and L5, sigmoidoscopy (-) 3. Nausea, abdominal pain, secondary to constipation vs #2 4. History of DVT, on xarelto  5. History of asthma, GERD 6. Anorexia and weight loss   Plan:  -I have reviewed her CT abd/pel with IR Dr. Kathlene Cote, who recommends biopsy of the RP node. I spoke with pt and her daughter, she agrees. Xarelto was held today  -If biopsy confirmed malignancy, I may get a PET scan as outpt to further evaluated her liver and other metastatic sites, since she is not able to tolerate MRI  -Pt asked about her prognosis and benefit of chemo, we discussed briefly  -Please restart Xarelto after biopsy  -I will follow up after her biopsy    Truitt Merle, MD 04/21/2017  9:00 PM

## 2017-04-21 NOTE — Evaluation (Signed)
Occupational Therapy Evaluation Patient Details Name: [name] MRN: [id number] DOB: 11/11/1956 Today's Date: 04/21/2017    History of Present Illness 61 y.o.female who presents with nausea, vomiting, abdominal pain. CT scan showing rectal mass and L5 lesion. PMH: DVT on xarelto, colorectalcancer (s/p of chemo and radiation), stroke, asthma, GERD, anxiety, polyneuropathy, Lt direct anterior THA 03/25/17.    Clinical Impression   Pt is a 61 y/o F who presents with the above. Pt has been receiving assistance from daughter for completing some ADLs prior to this admission. Pt presents with increased difficulty completing ADLs and functional mobility with greatest deficits in LB dressing and LB bathing. Pt will benefit from continued OT services to increase safety and independence with ADLs and functional mobility. Goals are for ModA to supervision.     Follow Up Recommendations  Home health OT;Supervision/Assistance - 24 hour    Equipment Recommendations  None recommended by OT           Precautions / Restrictions Precautions Precautions: Fall Precaution Comments: recent Lt DA THA Restrictions LLE Weight Bearing: Weight bearing as tolerated      Mobility Bed Mobility Overal bed mobility: Needs Assistance Bed Mobility: Supine to Sit;Sit to Supine     Supine to sit: Min assist;HOB elevated Sit to supine: Mod assist;HOB elevated   General bed mobility comments: assist for LEs during sit to supine transfer   Transfers Overall transfer level: Needs assistance Equipment used: Rolling walker (2 wheeled) Transfers: Sit to/from [facility] Sit to Stand: Min assist Stand pivot transfers: Min guard            Balance Overall balance assessment: Needs assistance Sitting-balance support: Bilateral upper extremity supported;Feet supported Sitting balance-Leahy Scale: Fair     Standing balance support: Bilateral upper extremity supported Standing  balance-Leahy Scale: Poor Standing balance comment: requires RW                           ADL either performed or assessed with clinical judgement   ADL Overall ADL's : Needs assistance/impaired Eating/Feeding: Independent;Sitting   Grooming: Wash/dry hands;Set up;Sitting   Upper Body Bathing: Min guard;Sitting   Lower Body Bathing: Minimal assistance;Sit to/from stand   Upper Body Dressing : Min guard;Sitting   Lower Body Dressing: Maximal assistance;Sit to/from stand   Toilet Transfer: Minimal assistance;Stand-pivot;BSC;RW   Toileting- Clothing Manipulation and Hygiene: Minimal assistance;Sit to/from stand Toileting - Clothing Manipulation Details (indicate cue type and reason): for toilet hygiene      Functional mobility during ADLs: Min guard;Rolling walker General ADL Comments: Pt completed bed mobility, stand pivot transfer to [facility] to complete toileting and washing up                         Pertinent Vitals/Pain Pain Assessment: Faces Faces Pain Scale: Hurts a little bit Pain Location: L hip Pain Descriptors / Indicators: Sore Pain Intervention(s): Limited activity within patient's tolerance;Monitored during session;Ice applied          Extremity/Trunk Assessment Upper Extremity Assessment Upper Extremity Assessment: Overall WFL for tasks assessed           Communication Communication Communication: No difficulties   Cognition Arousal/Alertness: Awake/alert Behavior During Therapy: Flat affect Overall Cognitive Status: Within Functional Limits for tasks assessed  General Comments                  Home Living Family/patient expects to be discharged to:: Private residence Living Arrangements: Children Available Help at Discharge: Family;Available 24 hours/day Type of Home: House Home Access: Stairs to enter     Home Layout: One level     Bathroom Shower/Tub: Arts administrator: Standard     Home Equipment: Wheelchair - [employer] - 2 wheels;Bedside commode   Additional Comments: patient's daughter lives with her, dtr works from home.       Prior Functioning/Environment Level of Independence: Needs assistance    ADL's / Homemaking Assistance Needed: dtr assisting with dressing and bathing.   Comments: pt states she was mostly bedbound for 9 months prior to [place] 61/03/25/17, She had gone to an inpt rehab and dragged foot when she walked        OT Problem List: Decreased strength;Decreased activity tolerance      OT Treatment/Interventions: Self-care/ADL training;DME and/or AE instruction;Energy conservation;Therapeutic activities    OT Goals(Current goals can be found in the care plan section) Acute Rehab OT Goals Patient Stated Goal: get back home, get rid of nausea OT Goal Formulation: With patient Time For Goal Achievement: 04/28/17 Potential to Achieve Goals: Good ADL Goals Pt Will Perform Grooming: with min assist;standing Pt Will Perform Lower Body Dressing: with mod assist;with adaptive equipment;sit to/from stand Pt Will Transfer to Toilet: with supervision;stand pivot transfer;bedside commode Pt Will Perform Toileting - Clothing Manipulation and hygiene: with min guard assist;sit to/from stand  OT Frequency: Min 2X/week                             AM-PAC PT "6 Clicks" Daily Activity     Outcome Measure Help from another person eating meals?: None Help from another person taking care of personal grooming?: A Little Help from another person toileting, which includes using toliet, bedpan, or urinal?: A Lot Help from another person bathing (including washing, rinsing, drying)?: A Lot Help from another person to put on and taking off regular upper body clothing?: A Little Help from another person to put on and taking off regular lower body clothing?: A Lot 6 Click Score: 16   End of Session Equipment Utilized  During Treatment: Gait belt;Rolling walker Nurse Communication: Mobility status  Activity Tolerance: Patient tolerated treatment well Patient left: in bed;with call bell/phone within reach;with family/visitor present  OT Visit Diagnosis: Muscle weakness (generalized) (M62.81)                Time: 1025-8527 OT Time Calculation (min): 35 min Charges:  OT General Charges $OT Visit: 1 Procedure OT Evaluation $OT Eval Moderate Complexity: 1 Procedure OT Treatments $Self Care/Home Management : 8-22 mins G-Codes:     Lou Cal, OT Pager [phone] 04/21/2017   [name] 04/21/2017, 1:27 PM

## 2017-04-21 NOTE — Consult Note (Signed)
Chief Complaint: Patient was seen in consultation today for CT guided retroperitoneal mass/adenopathy biopsy Chief Complaint  Patient presents with  . Nausea  . Abdominal Pain    Referring Physician(s): Chiu,S  Supervising Physician: Markus Daft  Patient Status: [facility] - In-pt  History of Present Illness: [name] is a 61 y.o. 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 She is s/p left total hip arthroplasty on 03/25/17. Recent imaging has revealed :  1. Left posterior perirectal lobulated mass concerning for rectal malignancy. A smaller nodular density superior to this mass likely represent a mildly enlarged metastatic node. MRI is recommended for further evaluation. 2. 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 A hypodense lesion to the left of the L5 posterior to the psoas muscle is also consistent with metastatic disease. 3. Multiple hepatic hypodense lesions, incompletely characterized, possibly metastatic. MRI is recommended for further evaluation. 4. No evidence of bowel obstruction or active inflammation. Normal appendix.  Rectal biopsies by GI on 04/17/17 revealed erosions/ulceration but no malignancy. Request now received for CT guided biopsy.  Past Medical History:  Diagnosis Date  . Anxiety   . Arthritis   . Asthma    childhood   . Cancer [facility])    colorectal cancer with chemo and radiation   . DVT (deep venous thrombosis) ([place])   . Dyspnea    with anxiety and exertion  . GERD (gastroesophageal reflux disease)   . Neuromuscular disorder (HCC)    poly neuropathy   . Stroke [facility]) 2010, 2011   TIA  x2    Past Surgical History:  Procedure Laterality Date  . CESAREAN SECTION    . THYROID CYST EXCISION     . TOTAL HIP ARTHROPLASTY Left 03/25/2017   Procedure: LEFT TOTAL HIP ARTHROPLASTY ANTERIOR APPROACH;  Surgeon: Paralee Cancel, MD;  Location: [facility] ORS;  Service: Orthopedics;  Laterality: Left;  Requests 70 mins    Allergies: Penicillins  Medications: Prior to Admission medications   Medication Sig Start Date End Date Taking? Authorizing Provider  bismuth subsalicylate (PEPTO BISMOL) 262 MG chewable tablet Chew 524 mg by mouth daily as needed for indigestion or diarrhea or loose stools.   Yes Historical Provider, MD  docusate sodium (COLACE) 100 MG capsule Take 1 capsule (100 mg total) by mouth 2 (two) times daily. 03/25/17  Yes Danae Orleans, PA-C  doxycycline (VIBRAMYCIN) 100 MG capsule Take 1 capsule (100 mg total) by mouth 2 (two) times daily. 03/27/17  Yes Matthew Babish, PA-C  Efinaconazole (JUBLIA) 10 % SOLN Apply 1 application topically daily.   Yes Historical Provider, MD  ferrous sulfate (FERROUSUL) 325 (65 FE) MG tablet Take 1 tablet (325 mg total) by mouth 3 (three) times daily with meals. 03/25/17  Yes Danae Orleans, PA-C  HYDROcodone-acetaminophen (NORCO/VICODIN) 5-325 MG tablet TK 1-2 TS PO Q 6 H PRN P 04/11/17  Yes Historical Provider, MD  methocarbamol (ROBAXIN) 500 MG tablet Take 1 tablet (500 mg total) by mouth every 6 (six) hours as needed for muscle spasms. 03/25/17  Yes Danae Orleans, PA-C  omeprazole (PRILOSEC) 20 MG capsule Take 20 mg by mouth daily.   Yes Historical Provider, MD  ondansetron (ZOFRAN) 4 MG tablet TK 1 T PO Q 6 H PRF NAUSEA 04/15/17  Yes Historical Provider, MD  polyethylene glycol (MIRALAX / GLYCOLAX) packet  Take 17 g by mouth 2 (two) times daily. 03/25/17  Yes Danae Orleans, PA-C  rivaroxaban (XARELTO) 20 MG TABS tablet Take 1 tablet (20 mg total) by mouth daily with supper. Please follow up with PCP to continue this medication. 03/27/17  Yes Matthew Babish, PA-C  saccharomyces boulardii (FLORASTOR) 250 MG capsule Take 250 mg by mouth daily.   Yes Historical  Provider, MD  HYDROcodone-acetaminophen (NORCO) 7.5-325 MG tablet Take 1-2 tablets by mouth every 4 (four) hours as needed for moderate pain or severe pain. Patient not taking: Reported on 04/16/2017 03/25/17   Danae Orleans, PA-C  Methenamine-Sodium Salicylate (AZO URINARY TRACT DEFENSE PO) Take 2 tablets by mouth daily as needed (uti symptoms).    Historical Provider, MD     Family History  Problem Relation Age of Onset  . Hypertension Mother   . Dementia Mother     Social History   Social History  . Marital status: Divorced    Spouse name: N/A  . Number of children: N/A  . Years of education: N/A   Social History Main Topics  . Smoking status: Former Smoker    Types: Cigarettes  . Smokeless tobacco: Never Used     Comment: 20 years ago   . Alcohol use No  . Drug use: No  . Sexual activity: Not Asked   Other Topics Concern  . None   Social History Narrative  . None      Review of Systems  denies fever, headache, chest pain, dyspnea, cough, significant abdominal pain, vomiting or abnormal bleeding. She does have some occasional nausea. Vital Signs: BP (!) 155/95 (BP Location: Left Arm)   Pulse (!) 109   Temp 98.7 F (37.1 C) (Oral)   Resp 16   Ht 5' (1.524 m)   Wt 143 lb 15.4 oz (65.3 kg)   SpO2 100%   BMI 28.12 kg/m   Physical Exam awake, alert.chest clear to auscultation. Heart with regular rate and rhythm. Abdomen soft, positive bowel sounds, nontender. Trace left lower extremity edema.   Mallampati Score:  MD Evaluation Airway: WNL Heart: WNL Abdomen: WNL Chest/ Lungs: WNL ASA  Classification: 3 Mallampati/Airway Score: Three  Imaging: Ct Head Wo Contrast  Result Date: 04/17/2017 CLINICAL DATA:  Altered mental status, panic attacks, colorectal cancer with chemotherapy and radiation. EXAM: CT HEAD WITHOUT CONTRAST TECHNIQUE: Contiguous axial images were obtained from the base of the skull through the vertex without intravenous contrast. COMPARISON:   None. FINDINGS: Brain: No evidence of acute infarction, hemorrhage, hydrocephalus, extra-axial collection or mass lesion/mass effect. Mild superficial atrophy. Vascular: No hyperdense vessel or unexpected calcification. Skull: Normal. Negative for fracture or focal lesion. Sinuses/Orbits: No acute finding. Other: The patient is slightly tilted on this exam. IMPRESSION: No acute intracranial abnormality.  Mild superficial atrophy. Electronically Signed   By: Ashley Royalty M.D.   On: 04/17/2017 01:44   Ct Abdomen Pelvis W Contrast  Result Date: 04/17/2017 CLINICAL DATA:  61 year old female with nausea and vomiting. EXAM: CT ABDOMEN AND PELVIS WITH CONTRAST TECHNIQUE: Multidetector CT imaging of the abdomen and pelvis was performed using the standard protocol following bolus administration of intravenous contrast. CONTRAST:  100 cc Isovue-300 COMPARISON:  None. FINDINGS: Lower chest: The visualized lung bases are clear. No intra-abdominal free air or free fluid. Hepatobiliary: A subcentimeter hypodense lesion in the right lobe of the liver as well as a 13 mm low attenuating lesion in the left lobe of the liver are not well characterized on this CT.  MRI may provide better characterisation. There is no intrahepatic biliary ductal dilatation. The gallbladder is unremarkable. Pancreas: Unremarkable. No pancreatic ductal dilatation or surrounding inflammatory changes. Spleen: Normal in size without focal abnormality. Adrenals/Urinary Tract: The adrenal glands are unremarkable. There is a 3 cm on left renal parapelvic cyst. A small exophytic cyst is noted from the inferior pole of the left kidney. Multiple small parenchymal calcific foci noted in the upper pole of the left kidney. There is no hydronephrosis on either side. There is symmetric uptake and excretion of contrast by kidneys. The visualized ureters and urinary bladder appear unremarkable. Stomach/Bowel: There is a 2.1 x 2.5 x 3.2 cm somewhat lobulated left  posterior rectal mass. There is loss of fat plane between this lesion and the rectal wall. This may represent a neoplasm arising from the rectum or an abnormal/ neoplastic lymph node. An 11 x 13 mm left perirectal rounded lesion more superiorly is also noted which may represent extension of the larger mass or a mildly enlarged lymph node. Pelvic MRI is recommended for further characterization. Oral contrast mixed with stool noted throughout the colon. There is no evidence of bowel obstruction or active inflammation. Normal appendix. There is probable duodenal diverticulum at the head of the pancreas. Vascular/Lymphatic: There is minimal aortoiliac atherosclerotic disease. The abdominal aorta and IVC are otherwise unremarkable. No portal venous gas identified. Multiple retroperitoneal adenopathy noted. A cluster of adjacent left para-aortic lymph node measure 4.2 x 3.0 x 5.5 cm. Reproductive: The uterus and ovaries are grossly unremarkable as visualized. Other: There is a 3.7 x 1.2 x 1.7 cm low attenuating lesion to the left of the in L5 vertebra posterior to the psoas muscle consistent with metastatic disease. Musculoskeletal: Multilevel degenerative changes of the spine with osteophyte formation. The bones are osteopenic. There is degenerative changes with disc space narrowing at L5-S1. No acute fracture. There is a total left hip arthroplasty. IMPRESSION: 1. Left posterior perirectal lobulated mass concerning for rectal malignancy. A smaller nodular density superior to this mass likely represent a mildly enlarged metastatic node. MRI is recommended for further evaluation. 2. 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 A hypodense lesion to the left of the L5 posterior to the psoas muscle is also consistent with metastatic disease. 3. Multiple hepatic hypodense lesions, incompletely characterized, possibly metastatic. MRI is recommended for further evaluation. 4.  No evidence of bowel obstruction or active inflammation. Normal appendix. Electronically Signed   By: Anner Crete M.D.   On: 04/17/2017 02:04   Dg Abd Portable 1v  Result Date: 04/19/2017 CLINICAL DATA:  Nausea vomiting for 2 days. History of colorectal cancer. EXAM: PORTABLE ABDOMEN - 1 VIEW COMPARISON:  CT of the abdomen pelvis 04/17/2017 FINDINGS: The bowel gas pattern is normal. Scattered radiodense material throughout the colon and stomach. Small amount of formed stool in the right colon and proximal transverse colon. Left hip prosthesis noted. IMPRESSION: Nonobstructive bowel gas pattern. Scattered radiodense material throughout the colon and stomach, likely within GI contents. Electronically Signed   By: Fidela Salisbury M.D.   On: 04/19/2017 11:48   Dg C-arm 61-120 Min-no Report  Result Date: 03/25/2017 Fluoroscopy was utilized by the requesting physician.  No radiographic interpretation.   Dg Hip Port Unilat With Pelvis 1v Left  Result Date: 03/25/2017 CLINICAL DATA:  Status post left total hip replacement. EXAM: DG HIP (WITH OR WITHOUT PELVIS) 1V PORT LEFT COMPARISON:  None. FINDINGS: Left total hip prosthesis in satisfactory position and alignment.  There is fragmentation of the superior aspect of the greater trochanter. Diffuse osteopenia. IMPRESSION: Satisfactory appearance of a left total hip prosthesis with fragmentation of the superior aspect of the left greater trochanter. Electronically Signed   By: Claudie Revering M.D.   On: 03/25/2017 10:57    Labs:  CBC:  Recent Labs  04/17/17 0641 04/17/17 0941 04/18/17 0624 04/19/17 0551  WBC 10.1 10.2 10.5 10.3  HGB 11.1* 11.2* 12.5 12.0  HCT 33.0* 32.5* 36.4 34.8*  PLT 484* 448* 510* 457*    COAGS:  Recent Labs  04/17/17 0638  INR 1.18  APTT 34    BMP:  Recent Labs  04/18/17 0624 04/19/17 0551 04/20/17 0536 04/21/17 0525  NA 138 135 136 136  K 3.0* 2.7* 3.0* 2.9*  CL 101 102 104 103  CO2 24 23 22 23     GLUCOSE 106* 102* 74 93  BUN <5* <5* 6 <5*  CALCIUM 9.4 8.9 8.7* 8.8*  CREATININE 0.51 0.47 0.55 0.42*  GFRNONAA >60 >60 >60 >60  GFRAA >60 >60 >60 >60    LIVER FUNCTION TESTS:  Recent Labs  04/16/17 2123  BILITOT 0.4  AST 18  ALT 9*  ALKPHOS 102  PROT 7.9  ALBUMIN 4.0    TUMOR MARKERS:  Recent Labs  04/17/17 0641  AFPTM 5.0  CEA 1.5    Assessment and Plan: 61 y.o. female with medical history significant of DVT on xarelto, rectal squamous cellcancer (s/p chemo and radiation in [place] 2016), stroke, asthma, GERD, anxiety, polyneuropathy, who was admitted 4/25 with nausea, vomiting, abdominal pain. She is s/p left total hip arthroplasty on 03/25/17. Recent imaging has revealed :  1. Left posterior perirectal lobulated mass concerning for rectal malignancy. A smaller nodular density superior to this mass likely represent a mildly enlarged metastatic node. MRI is recommended for further evaluation. 2. 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 A hypodense lesion to the left of the L5 posterior to the psoas muscle is also consistent with metastatic disease. 3. Multiple hepatic hypodense lesions, incompletely characterized, possibly metastatic. MRI is recommended for further evaluation. 4. No evidence of bowel obstruction or active inflammation. Normal appendix.  Rectal biopsies by GI on 04/17/17 revealed erosions/ulceration but no malignancy. Request now received for CT guided biopsy. Imaging studies have been reviewed by Dr. Kathlene Cote and he feels that area to biopsy with most yield would be retroperitoneal adenopathy.Risks and benefits discussed with the patient including, but not limited to bleeding, infection, damage to adjacent structures or low yield requiring additional tests. All of the patient's questions were answered, patient is agreeable to proceed. Consent signed and in chart. Patient did not receive  Xarelto today and it has since been discontinued. Procedure tentatively planned for 5/1.      Thank you for this interesting consult.  I greatly enjoyed meeting [name] and look forward to participating in their care.  A copy of this report was sent to the requesting provider on this date.  Electronically Signed: D. Rowe Robert 04/21/2017, 2:09 PM   I spent a total of  30 minutes   in face to face in clinical consultation, greater than 50% of which was counseling/coordinating care for CT-guided retroperitoneal mass/lymph node biopsy

## 2017-04-22 ENCOUNTER — Inpatient Hospital Stay (HOSPITAL_COMMUNITY): Payer: Federal, State, Local not specified - PPO

## 2017-04-22 DIAGNOSIS — C20 Malignant neoplasm of rectum: Secondary | ICD-10-CM

## 2017-04-22 DIAGNOSIS — C7951 Secondary malignant neoplasm of bone: Secondary | ICD-10-CM

## 2017-04-22 LAB — COMPREHENSIVE METABOLIC PANEL
ALBUMIN: 3.4 g/dL — AB (ref 3.5–5.0)
ALK PHOS: 74 U/L (ref 38–126)
ALT: 8 U/L — ABNORMAL LOW (ref 14–54)
ANION GAP: 6 (ref 5–15)
AST: 14 U/L — ABNORMAL LOW (ref 15–41)
BILIRUBIN TOTAL: 0.9 mg/dL (ref 0.3–1.2)
BUN: <5 mg/dL — ABNORMAL LOW (ref 6–20)
CO2: 26 mmol/L (ref 22–32)
CREATININE: 0.48 mg/dL (ref 0.44–1.00)
Calcium: 8.8 mg/dL — ABNORMAL LOW (ref 8.9–10.3)
Chloride: 107 mmol/L (ref 101–111)
GFR calc non Af Amer: >60 mL/min (ref >60)
GLUCOSE: 99 mg/dL (ref 65–99)
Potassium: 3.3 mmol/L — ABNORMAL LOW (ref 3.5–5.1)
Sodium: 139 mmol/L (ref 135–145)
TOTAL PROTEIN: 6.2 g/dL — AB (ref 6.5–8.1)

## 2017-04-22 LAB — CBC WITH DIFFERENTIAL/PLATELET
Basophils Absolute: 0 10*3/uL (ref 0.0–0.1)
Basophils Relative: 0 %
EOS PCT: 2 %
Eosinophils Absolute: 0.1 10*3/uL (ref 0.0–0.7)
HCT: 33.6 % — ABNORMAL LOW (ref 36.0–46.0)
Hemoglobin: 11.2 g/dL — ABNORMAL LOW (ref 12.0–15.0)
LYMPHS ABS: 1.3 10*3/uL (ref 0.7–4.0)
LYMPHS PCT: 13 %
MCH: 27.3 pg (ref 26.0–34.0)
MCHC: 33.3 g/dL (ref 30.0–36.0)
MCV: 81.8 fL (ref 78.0–100.0)
MONO ABS: 1 10*3/uL (ref 0.1–1.0)
Monocytes Relative: 11 %
Neutro Abs: 6.9 10*3/uL (ref 1.7–7.7)
Neutrophils Relative %: 74 %
PLATELETS: 411 10*3/uL — AB (ref 150–400)
RBC: 4.11 MIL/uL (ref 3.87–5.11)
RDW: 18.3 % — AB (ref 11.5–15.5)
WBC: 9.3 10*3/uL (ref 4.0–10.5)

## 2017-04-22 LAB — GLUCOSE, CAPILLARY: GLUCOSE-CAPILLARY: 80 mg/dL (ref 65–99)

## 2017-04-22 LAB — PROTIME-INR
INR: 1.07
Prothrombin Time: 14 seconds (ref 11.4–15.2)

## 2017-04-22 MED ORDER — FENTANYL CITRATE (PF) 100 MCG/2ML IJ SOLN
INTRAMUSCULAR | Status: AC | PRN
  Administered 2017-04-22 (×2): 50 ug via INTRAVENOUS
  Administered 2017-04-22: 25 ug via INTRAVENOUS

## 2017-04-22 MED ORDER — TECHNETIUM TC 99M SULFUR COLLOID
2.2000 | Freq: Once | INTRAVENOUS | Status: AC | PRN
  Administered 2017-04-22: 2.2 via ORAL

## 2017-04-22 MED ORDER — MIDAZOLAM HCL 2 MG/2ML IJ SOLN
INTRAMUSCULAR | Status: AC | PRN
  Administered 2017-04-22 (×5): 1 mg via INTRAVENOUS

## 2017-04-22 MED ORDER — MIDAZOLAM HCL 2 MG/2ML IJ SOLN
INTRAMUSCULAR | Status: AC
  Filled 2017-04-22: qty 6

## 2017-04-22 MED ORDER — FENTANYL CITRATE (PF) 100 MCG/2ML IJ SOLN
INTRAMUSCULAR | Status: AC
  Filled 2017-04-22: qty 4

## 2017-04-22 MED ORDER — POTASSIUM CHLORIDE CRYS ER 20 MEQ PO TBCR
40.0000 meq | EXTENDED_RELEASE_TABLET | Freq: Two times a day (BID) | ORAL | Status: AC
  Administered 2017-04-22: 40 meq via ORAL
  Filled 2017-04-22: qty 2

## 2017-04-22 NOTE — Procedures (Signed)
CT guided core biopsy of left retroperitoneal lymph node.  4 core obtained.  Minimal bleeding.  No immediate complication.  Bedrest 2 hours.

## 2017-04-22 NOTE — Sedation Documentation (Signed)
Patient denies pain and is resting comfortably.  

## 2017-04-22 NOTE — Sedation Documentation (Signed)
Patient is resting comfortably. 

## 2017-04-22 NOTE — Progress Notes (Addendum)
Physical Therapy Treatment Patient Details Name: [name] MRN: [id number] DOB: 1956/02/08 Today's Date: 04/22/2017    History of Present Illness 61 y.o.female who presents with nausea, vomiting, abdominal pain. CT scan showing rectal mass and L5 lesion. PMH: DVT on xarelto, colorectalcancer (s/p of chemo and radiation), stroke, asthma, GERD, anxiety, polyneuropathy, Lt direct anterior THA 03/25/17.     PT Comments    Pt ambulated 14'x 2 with RW with min/guard assist. Encouraged pt to walk to bathroom rather than using 3 in 1 from now on. Noted open, red wound at top of L THA incision, RN and MD notified.  Instructed pt in independent exercises for BLEs.   Follow Up Recommendations  Home health PT;Supervision/Assistance - 24 hour     Equipment Recommendations  None recommended by PT    Recommendations for Other Services       Precautions / Restrictions Precautions Precautions: Fall Precaution Comments: recent Lt DA THA Restrictions LLE Weight Bearing: Weight bearing as tolerated    Mobility  Bed Mobility                  Transfers                    Ambulation/Gait                 Stairs            Wheelchair Mobility    Modified Rankin (Stroke Patients Only)       Balance                                            Cognition Arousal/Alertness: Awake/alert Behavior During Therapy: WFL for tasks assessed/performed Overall Cognitive Status: Within Functional Limits for tasks assessed                                        Exercises Total Joint Exercises Ankle Circles/Pumps: AROM;Both;10 reps;Supine Quad Sets: Strengthening;Both;5 reps Short Arc Quad: AROM;Both;10 reps;Supine Heel Slides: AAROM;10 reps;Both;Supine Hip ABduction/ADduction: AAROM;10 reps;Both;Supine    General Comments        Pertinent Vitals/Pain Pain Assessment: No/denies pain    Home Living                       Prior Function            PT Goals (current goals can now be found in the care plan section) Acute Rehab PT Goals Patient Stated Goal: get back home, get rid of nausea PT Goal Formulation: With patient Time For Goal Achievement: 05/04/17 Potential to Achieve Goals: Good Progress towards PT goals: Progressing toward goals    Frequency    Min 3X/week      PT Plan Current plan remains appropriate    Co-evaluation              AM-PAC PT "6 Clicks" Daily Activity  Outcome Measure  Difficulty turning over in bed (including adjusting bedclothes, sheets and blankets)?: A Little Difficulty moving from lying on back to sitting on the side of the bed? : A Little Difficulty sitting down on and standing up from a chair with arms (e.g., wheelchair, bedside commode, etc,.)?: A Little Help needed moving to and from a bed to chair (including  a wheelchair)?: A Little Help needed walking in hospital room?: A Little Help needed climbing 3-5 steps with a railing? : A lot     End of Session   Activity Tolerance: Patient limited by fatigue (nausea) Patient left: in bed;with call bell/phone within reach;with bed alarm set Nurse Communication: Mobility status PT Visit Diagnosis: Muscle weakness (generalized) (M62.81);Difficulty in walking, not elsewhere classified (R26.2)     Time: 8648-4720 PT Time Calculation (min) (ACUTE ONLY): 12 min  Charges:  $Therapeutic Exercise: 8-22 mins Gait training 8-22 min                     G Codes:          Philomena Doheny 04/22/2017, 2:32 PM [phone]

## 2017-04-22 NOTE — Progress Notes (Addendum)
PROGRESS NOTE    [name]  NTI:[id number] DOB: 1956/04/14 DOA: 04/16/2017 PCP: Damaris Hippo, MD    Brief Narrative:  61 y.o. female with medical history significant of DVT on xarelto, colorectal cancer (s/p of chemo and radiation), stroke, asthma, GERD, anxiety, polyneuropathy, who presents with nausea, vomiting, abdominal pain.  Pt states that she started having nausea, vomiting and abdominal pain at about 2:30 PM. She vomited 5-6 times without blood in the vomitus. Her abdominal pain is located in the middle abdomen, 4/10 in severity, sharp, nonradiating. Patient does not have diarrhea, fever or chills. Patient denies symptoms of UTI.  Per her daughter, patient has chronic mild SOB sometimes, which has not changed. Currently no shortness breath, chest pain, cough. No unilateral weakness. Pt recently had left hip surgery on 03/25/17, currently she is taking Norco, Robaxin and doxycycline for possible left hip infection. Per her daughter, pt did not miss doses of xarelto. Pt is mildly confused. Per EDP, pt has "catatonic behavior in ED". Per her daughter, the patient has had similar episodes in the past. Per nurse the patient has been screaming "I cannot be in this room". Per her daughter, tiny amount of blood in her stool was noted when he wipes  During this hospital course, CT abd noted to have findings including rectal mass and enlarged nodes (see below). Patient underwent sigmoidoscopy with biopsy showing benign findings. IR consulted for node biopsy. Oncology consulted and is following. Patient was started on reglan with gastric emptying study later suggesting gastroparesis. Patient has since shown much symptomatic improvement with current higher reglan dose.  Assessment & Plan:   Principal Problem:   Abdominal pain Active Problems:   DVT (deep venous thrombosis) (HCC)   Asthma   GERD (gastroesophageal reflux disease)   Nausea & vomiting   Anal cancer (HCC)   Rectal  bleeding   Acute encephalopathy   Cancer (HCC)  Abdominal pain, nausea, vomiting:  - Outside records pending. Family present in room who could provide history. Patient reported has hx of stage 3 colon cancer s/p chemo and radiation in 2016. Per daughter, pt was noted to be in remission and since then, lost to follow up.  - CT scan personally reviewed. 2-3cm rectal mass noted, as well as L5 lesion and multiple nodes - Imaging initially demonstrated stool throughout the colon per my own read - Started scheduled reglan. Dose increased to 10mg  q8hrs - Patient has shown much improvement since increased dose of reglan - Gastric emptying study done, results suggestive of gastroparesis  Colon rectal cancer:  - Reportedly s/p of chemotherapy and radiation therapy. - Pt now with concerns of metastatic disease as per above - Consulted Oncology, who recommended GI consult for biopsy to be obtained endoscopically - Appreciate assistance by GI. Pt is s/p endoscopy, findings of radiation changes - Have been unable to obtain MRI x 2. Discussed with Oncology with recs for node biopsy instead - IR consulted. Plan for node biopsy later today  DVT (deep venous thrombosis) ([place]): -Will continue Xarelto as tolerated - There are no signs of active bleed - Stable at present  Asthma:  -currently stable -Plan to continue with PRN nebs  GERD: - Protonix continued - Remains stable at present  Acute encephalopathy:  -Patient had required Haldol and one dose of narcan in ED at time of admission.  -Patient currently stable   Rectal bleeding:  - small amounts of blood reported by family member - Patient continue her Xarelto now - CBC  has remained stable thus far -Hemodynamically stable  Elevated blood pressure:  - Without prior hx of hypertension.  - improved, however still suboptimally controlled -Patient continued on IVF hydralazine as needed - remains stable  DVT prophylaxis: Xarelto Code  Status: Full Family Communication: Pt in room, family at bedside Disposition Plan: Possible d/c home in 24hrs if stable  Consultants:   GI   Oncology  Procedures:   Sigmoidoscopy 4/26  Antimicrobials: Anti-infectives    Start     Dose/Rate Route Frequency Ordered Stop   04/17/17 1000  doxycycline (VIBRA-TABS) tablet 100 mg     100 mg Oral 2 times daily 04/17/17 0402        Subjective: States feeling much better today since starting higher dose of reglan  Objective: Vitals:   04/21/17 0541 04/21/17 1444 04/21/17 2136 04/22/17 0558  BP: (!) 155/95 (!) 159/100 (!) 155/6 (!) 149/77  Pulse: (!) 109 93 93 94  Resp: 16 17 18 18   Temp: 98.7 F (37.1 C) 98.9 F (37.2 C) 98.6 F (37 C) 98.9 F (37.2 C)  TempSrc: Oral Oral Oral Oral  SpO2: 100% 99% 99% 100%  Weight:      Height:        Intake/Output Summary (Last 24 hours) at 04/22/17 1442 Last data filed at 04/22/17 7939  Gross per 24 hour  Intake              400 ml  Output             1600 ml  Net            -1200 ml   Filed Weights   04/19/17 1747  Weight: 65.3 kg (143 lb 15.4 oz)    Examination:  General exam: awake, laying in bed, in nad Respiratory system: normal resp effort, no audible wheezing Cardiovascular system: regular rate, s1, s2 Gastrointestinal system: soft, nondistended, normal bowel sounds Central nervous system: cn2-12 grossly intact, strength intact Extremities: perfused, no nclubbing Skin: normal skin turgor, no notable skin lesions seen Psychiatry: mood normal// no visual hallucinations  Data Reviewed: I have personally reviewed following labs and imaging studies  CBC:  Recent Labs Lab 04/16/17 2123 04/17/17 0641 04/17/17 0941 04/18/17 0624 04/19/17 0551 04/22/17 0529  WBC 9.6 10.1 10.2 10.5 10.3 9.3  NEUTROABS 8.4*  --   --   --   --  6.9  HGB 11.4* 11.1* 11.2* 12.5 12.0 11.2*  HCT 33.7* 33.0* 32.5* 36.4 34.8* 33.6*  MCV 79.7 79.3 80.4 80.5 80.4 81.8  PLT 470* 484* 448*  510* 457* 030*   Basic Metabolic Panel:  Recent Labs Lab 04/18/17 0624 04/19/17 0551 04/20/17 0536 04/21/17 0525 04/22/17 0529  NA 138 135 136 136 139  K 3.0* 2.7* 3.0* 2.9* 3.3*  CL 101 102 104 103 107  CO2 24 23 22 23 26   GLUCOSE 106* 102* 74 93 99  BUN <5* <5* 6 <5* <5*  CREATININE 0.51 0.47 0.55 0.42* 0.48  CALCIUM 9.4 8.9 8.7* 8.8* 8.8*  MG 1.9  --   --   --   --    GFR: Estimated Creatinine Clearance: 63.8 mL/min (by C-G formula based on SCr of 0.48 mg/dL). Liver Function Tests:  Recent Labs Lab 04/16/17 2123 04/22/17 0529  AST 18 14*  ALT 9* 8*  ALKPHOS 102 74  BILITOT 0.4 0.9  PROT 7.9 6.2*  ALBUMIN 4.0 3.4*    Recent Labs Lab 04/16/17 2123  LIPASE 17    Recent  Labs Lab 04/17/17 0638  AMMONIA 11   Coagulation Profile:  Recent Labs Lab 04/17/17 0638 04/22/17 0529  INR 1.18 1.07   Cardiac Enzymes: No results for input(s): CKTOTAL, CKMB, CKMBINDEX, TROPONINI in the last 168 hours. BNP (last 3 results) No results for input(s): PROBNP in the last 8760 hours. HbA1C: No results for input(s): HGBA1C in the last 72 hours. CBG:  Recent Labs Lab 04/19/17 0659 04/20/17 0651 04/20/17 0733 04/21/17 0812 04/22/17 0750  GLUCAP 91 68 86 94 80   Lipid Profile: No results for input(s): CHOL, HDL, LDLCALC, TRIG, CHOLHDL, LDLDIRECT in the last 72 hours. Thyroid Function Tests: No results for input(s): TSH, T4TOTAL, FREET4, T3FREE, THYROIDAB in the last 72 hours. Anemia Panel: No results for input(s): VITAMINB12, FOLATE, FERRITIN, TIBC, IRON, RETICCTPCT in the last 72 hours. Sepsis Labs: No results for input(s): PROCALCITON, LATICACIDVEN in the last 168 hours.  Recent Results (from the past 240 hour(s))  Urine culture     Status: Abnormal   Collection Time: 04/16/17  9:24 PM  Result Value Ref Range Status   Specimen Description URINE, RANDOM  Final   Special Requests NONE  Final   Culture MULTIPLE SPECIES PRESENT, SUGGEST RECOLLECTION (A)   Final   Report Status 04/18/2017 FINAL  Final     Radiology Studies: Nm Gastric Emptying  Result Date: 04/22/2017 CLINICAL DATA:  Nausea vomiting. EXAM: NUCLEAR MEDICINE GASTRIC EMPTYING SCAN TECHNIQUE: After oral ingestion of radiolabeled meal, sequential abdominal images were obtained for 4 hours. Percentage of activity emptying the stomach was calculated at 1 hour, 2 hour, 3 hour, and 4 hours. RADIOPHARMACEUTICALS:  2.2 mCi Tc-53m sulfur colloid in standardized meal COMPARISON:  CT 04/17/2017. FINDINGS: Expected location of the stomach in the left upper quadrant. Ingested meal empties the stomach and only slightly empties over the course of the study. 8.2% emptied at 1 hr ( normal >= 10%) 15.2% emptied at 2 hr ( normal >= 40%) 25.4% emptied at 3 hr ( normal >= 70%) 31.3% emptied at 4 hr ( normal >= 90%) IMPRESSION: Delayed gastric emptying study. Electronically Signed   By: Lluveras   On: 04/22/2017 14:12    Scheduled Meds: . docusate sodium  100 mg Oral BID  . doxycycline  100 mg Oral BID  . ferrous sulfate  325 mg Oral TID WC  . metoCLOPramide (REGLAN) injection  10 mg Intravenous Q8H  . pantoprazole  40 mg Oral Daily  . potassium chloride  40 mEq Oral BID  . saccharomyces boulardii  250 mg Oral Daily  . sodium chloride flush  3 mL Intravenous Q12H   Continuous Infusions: . sodium chloride 100 mL/hr at 04/21/17 1603     LOS: 5 days   CHIU, Orpah Melter, MD [facility] Pager [phone]  If 7PM-7AM, please contact night-coverage [email or web address] Password [email or web address] 04/22/2017, 2:42 PM

## 2017-04-23 ENCOUNTER — Other Ambulatory Visit: Payer: Self-pay | Admitting: Hematology

## 2017-04-23 DIAGNOSIS — K3184 Gastroparesis: Principal | ICD-10-CM

## 2017-04-23 DIAGNOSIS — C189 Malignant neoplasm of colon, unspecified: Secondary | ICD-10-CM

## 2017-04-23 DIAGNOSIS — I82409 Acute embolism and thrombosis of unspecified deep veins of unspecified lower extremity: Secondary | ICD-10-CM

## 2017-04-23 LAB — BASIC METABOLIC PANEL
Anion gap: 6 (ref 5–15)
CHLORIDE: 107 mmol/L (ref 101–111)
CO2: 25 mmol/L (ref 22–32)
CREATININE: 0.46 mg/dL (ref 0.44–1.00)
Calcium: 8.9 mg/dL (ref 8.9–10.3)
Glucose, Bld: 99 mg/dL (ref 65–99)
POTASSIUM: 3.7 mmol/L (ref 3.5–5.1)
SODIUM: 138 mmol/L (ref 135–145)

## 2017-04-23 LAB — GLUCOSE, CAPILLARY: GLUCOSE-CAPILLARY: 106 mg/dL — AB (ref 65–99)

## 2017-04-23 MED ORDER — ONDANSETRON 4 MG PO TBDP: 4.0000 mg | ORAL_TABLET | Freq: Three times a day (TID) | ORAL | Status: DC | PRN

## 2017-04-23 MED ORDER — METOCLOPRAMIDE HCL 10 MG PO TABS: 10.0000 mg | ORAL_TABLET | Freq: Three times a day (TID) | ORAL | 0 refills | Status: DC

## 2017-04-23 MED ORDER — ONDANSETRON 4 MG PO TBDP: 4.0000 mg | ORAL_TABLET | Freq: Three times a day (TID) | ORAL | 0 refills | Status: DC | PRN

## 2017-04-23 MED ORDER — METOCLOPRAMIDE HCL 10 MG PO TABS
10.0000 mg | ORAL_TABLET | Freq: Three times a day (TID) | ORAL | Status: DC
  Administered 2017-04-23: 10 mg via ORAL
  Filled 2017-04-23: qty 1

## 2017-04-23 MED ORDER — FLUCONAZOLE 100 MG PO TABS
200.0000 mg | ORAL_TABLET | Freq: Once | ORAL | Status: AC
  Administered 2017-04-23: 200 mg via ORAL
  Filled 2017-04-23: qty 2

## 2017-04-23 NOTE — Consult Note (Signed)
[facility] Nurse wound consult note Reason for Consult:Intertriginous dermatitis in the subpannicular skin fold, left > right. (Left is where proximal incision is impacted) Wound type:Moisture Pressure Injury POA: No Measurement:3cm x 4cm x 0.1cm area affected at proximal end of 11cm closely approximated incision. Wound HFG:BMSX, moist Drainage (amount, consistency, odor) scant to small amount of serous, somewhat musty odor Periwound:macerated Dressing procedure/placement/frequency: I will provide patient with house antimicrobial wicking product ([employer]+) and have talked with her about its use today. Additionally, she shows me her labia and I am able to view the vaginal vault with is erythematous and edematous, consistent with fungal overgrowth.  I suggest a one time dose of oral or IV antifungal for this clinical presentation.  If you agree, please order. [facility] nursing team will not follow, but will remain available to this patient, the nursing and medical teams.  Please re-consult if needed. Thanks, Maudie Flakes, MSN, RN, [place], Arther Abbott  Pager# [phone]

## 2017-04-23 NOTE — Discharge Summary (Signed)
Physician Discharge Summary  [name]  LZJ:[id number]  DOB: 08-26-1956  DOA: 04/16/2017 PCP: Damaris Hippo, MD  Admit date: 04/16/2017 Discharge date: 04/23/2017  Admitted From: Home Disposition:  Home  Recommendations for Outpatient Follow-up:  1. Follow up with PCP in 1- week 2. Please obtain BMP/CBC in one week 3. Please follow up on the following pending results: Biopsy of left retroperitoneal lymphadenopathy 4. Follow-up with oncologist next week 5. Frequent small meals  Home Health: PT/OT  Discharge Condition: Improved CODE STATUS: Full Diet recommendation: Heart Healthy / Carb Modified   Brief/Interim Summary: 61 y.o.femalewith medical history significant of DVT on xarelto, colorectalcancer (s/p of chemo and radiation), stroke, asthma, GERD, anxiety, polyneuropathy, who presents with nausea, vomiting, abdominal pain.  Pt states that she started having nausea, vomiting and abdominal pain. She vomited 5-6 times without blood in the vomitus.  Per her daughter, patient has chronic mild SOBsometimes, which has not changed. Currently had no shortness breath, chest pain, cough. No unilateral weakness. Pt recently had left hip surgery on 03/25/17, currently she is taking Norco, Robaxinand doxycycline for possible left hip infection. Per her daughter, pt did not miss doses of xarelto. Pt was mildly confused.. Per her daughter, the patient has had similar episodes in the past. Per nurse the patient was screaming "I cannot be in this room". Per her daughter, tiny amount of blood in her stool was noted when he wipes  During this hospital course, CT abd noted to have findings including rectal mass and enlarged nodes. Patient underwent sigmoidoscopy with biopsy showing benign findings. IR was consulted for node biopsy and which was done and awaiting results. Patient was started on reglan with gastric emptying study suggesting gastroparesis. Patient has since shown much symptomatic  improvement with current high reglan dose. Given patient clinically improving and tolerating oral diet will be discharge to follow-up with PCP and oncologist.  Subjective: Patient seen and examined, complaining of mild nauseous today and salivation. Tolerating diet well with no vomiting. Patient denies abdominal pain and diarrhea.  Discharge Diagnoses/Hospital Course:  Gastroparesis - causing nausea and vomiting with abdominal pain. Unable to determine clinical causes, could be secondary to chemotherapy and radiation from colon cancer. Continue scheduled Reglan 3 times a day and at bedtime. Recommended small frequent meals 5-6 times a day Recommend to follow-up with GI as an outpatient. Patient tolerating diet well  Colorectal cancer Per patient stage III colon cancer status post chemotherapy and radiation and on remission since 2016. CT scan 4/26 shows a 2-3 cm in rectal mass as well as L5 lesion and multiple nodes. Status post left retroperitoneal lymphadenopathy - results pending will follow with oncology. Patient went under endoscopy with findings of radiation changes MRI x 2 was ordered but patient was unable to tolerate. Oncology planning for PET scan as an outpatient after she discusses biopsy results.  DVT Patient on Xarelto - continue current regimen Follow-up with PCP  Asthma - currently stable Resume home medication  Acute metabolic encephalopathy -  secondary to dehydration - resolved  Elevated blood pressure during hospital stay without diagnosis of hypertension Patient was monitored and no medication was started subsequently blood pressure Patient to follow-up with PCP in one week to check blood pressure is elevated recommending start BP medication.  All other chronic medical condition were stable during the hospitalization.  Patient was seen by physical therapy, recommending home health PT On the day of the discharge the patient's vitals were stable, and no other  acute medical condition  were reported by patient. Patient was felt safe to be discharge to home  Discharge Instructions  You were cared for by a hospitalist during your hospital stay. If you have any questions about your discharge medications or the care you received while you were in the hospital after you are discharged, you can call the unit and asked to speak with the hospitalist on call if the hospitalist that took care of you is not available. Once you are discharged, your primary care physician will handle any further medical issues. Please note that NO REFILLS for any discharge medications will be authorized once you are discharged, as it is imperative that you return to your primary care physician (or establish a relationship with a primary care physician if you do not have one) for your aftercare needs so that they can reassess your need for medications and monitor your lab values.  Discharge Instructions    Call MD for:  difficulty breathing, headache or visual disturbances    Complete by:  As directed    Call MD for:  extreme fatigue    Complete by:  As directed    Call MD for:  hives    Complete by:  As directed    Call MD for:  persistant dizziness or light-headedness    Complete by:  As directed    Call MD for:  persistant nausea and vomiting    Complete by:  As directed    Call MD for:  redness, tenderness, or signs of infection (pain, swelling, redness, odor or green/yellow discharge around incision site)    Complete by:  As directed    Call MD for:  severe uncontrolled pain    Complete by:  As directed    Call MD for:  temperature >100.4    Complete by:  As directed    Diet - low sodium heart healthy    Complete by:  As directed    Increase activity slowly    Complete by:  As directed      Allergies as of 04/23/2017      Reactions   Penicillins Itching   Has patient had a PCN reaction causing immediate rash, facial/tongue/throat swelling, SOB or lightheadedness with  hypotension: Unknown Has patient had a PCN reaction causing severe rash involving mucus membranes or skin necrosis: Unknown Has patient had a PCN reaction that required hospitalization Unknown Has patient had a PCN reaction occurring within the last 10 years: Unknown Daughter states pt cant take any derivative of penicillin either      Medication List    STOP taking these medications   ondansetron 4 MG tablet Commonly known as:  ZOFRAN     TAKE these medications   AZO URINARY TRACT DEFENSE PO Take 2 tablets by mouth daily as needed (uti symptoms).   bismuth subsalicylate 254 MG chewable tablet Commonly known as:  PEPTO BISMOL Chew 524 mg by mouth daily as needed for indigestion or diarrhea or loose stools.   docusate sodium 100 MG capsule Commonly known as:  COLACE Take 1 capsule (100 mg total) by mouth 2 (two) times daily.   doxycycline 100 MG capsule Commonly known as:  VIBRAMYCIN Take 1 capsule (100 mg total) by mouth 2 (two) times daily.   ferrous sulfate 325 (65 FE) MG tablet Commonly known as:  FERROUSUL Take 1 tablet (325 mg total) by mouth 3 (three) times daily with meals.   HYDROcodone-acetaminophen 7.5-325 MG tablet Commonly known as:  NORCO Take 1-2 tablets by  mouth every 4 (four) hours as needed for moderate pain or severe pain. What changed:  Another medication with the same name was removed. Continue taking this medication, and follow the directions you see here.   JUBLIA 10 % Soln Generic drug:  Efinaconazole Apply 1 application topically daily.   methocarbamol 500 MG tablet Commonly known as:  ROBAXIN Take 1 tablet (500 mg total) by mouth every 6 (six) hours as needed for muscle spasms.   metoCLOPramide 10 MG tablet Commonly known as:  REGLAN Take 1 tablet (10 mg total) by mouth 4 (four) times daily -  before meals and at bedtime.   omeprazole 20 MG capsule Commonly known as:  PRILOSEC Take 20 mg by mouth daily.   ondansetron 4 MG disintegrating  tablet Commonly known as:  ZOFRAN-ODT Take 1 tablet (4 mg total) by mouth every 8 (eight) hours as needed for nausea or vomiting.   polyethylene glycol packet Commonly known as:  MIRALAX / GLYCOLAX Take 17 g by mouth 2 (two) times daily.   rivaroxaban 20 MG Tabs tablet Commonly known as:  XARELTO Take 1 tablet (20 mg total) by mouth daily with supper. Please follow up with PCP to continue this medication.   saccharomyces boulardii 250 MG capsule Commonly known as:  FLORASTOR Take 250 mg by mouth daily.      Follow-up Information    [facility] Follow up.   Specialty:  [place] Why:  [place] physical therapy,occupational therapy Contact information: [address] [place] [address] [phone]        Truitt Merle, MD. Call in 1 week(s).   Specialties:  Hematology, Oncology Why:  Hospital Follow up  Contact information: [address] [place] [address] [phone]          Allergies  Allergen Reactions  . Penicillins Itching    Has patient had a PCN reaction causing immediate rash, facial/tongue/throat swelling, SOB or lightheadedness with hypotension: Unknown Has patient had a PCN reaction causing severe rash involving mucus membranes or skin necrosis: Unknown Has patient had a PCN reaction that required hospitalization Unknown Has patient had a PCN reaction occurring within the last 10 years: Unknown Daughter states pt cant take any derivative of penicillin either    Consultations:  GI  Oncology   IR    Procedures/Studies: Ct Head Wo Contrast  Result Date: 04/17/2017 CLINICAL DATA:  Altered mental status, panic attacks, colorectal cancer with chemotherapy and radiation. EXAM: CT HEAD WITHOUT CONTRAST TECHNIQUE: Contiguous axial images were obtained from the base of the skull through the vertex without intravenous contrast. COMPARISON:  None. FINDINGS: Brain: No evidence of acute infarction, hemorrhage, hydrocephalus,  extra-axial collection or mass lesion/mass effect. Mild superficial atrophy. Vascular: No hyperdense vessel or unexpected calcification. Skull: Normal. Negative for fracture or focal lesion. Sinuses/Orbits: No acute finding. Other: The patient is slightly tilted on this exam. IMPRESSION: No acute intracranial abnormality.  Mild superficial atrophy. Electronically Signed   By: Ashley Royalty M.D.   On: 04/17/2017 01:44   Nm Gastric Emptying  Result Date: 04/22/2017 CLINICAL DATA:  Nausea vomiting. EXAM: NUCLEAR MEDICINE GASTRIC EMPTYING SCAN TECHNIQUE: After oral ingestion of radiolabeled meal, sequential abdominal images were obtained for 4 hours. Percentage of activity emptying the stomach was calculated at 1 hour, 2 hour, 3 hour, and 4 hours. RADIOPHARMACEUTICALS:  2.2 mCi Tc-65m sulfur colloid in standardized meal COMPARISON:  CT 04/17/2017. FINDINGS: Expected location of the stomach in the left upper quadrant.  Ingested meal empties the stomach and only slightly empties over the course of the study. 8.2% emptied at 1 hr ( normal >= 10%) 15.2% emptied at 2 hr ( normal >= 40%) 25.4% emptied at 3 hr ( normal >= 70%) 31.3% emptied at 4 hr ( normal >= 90%) IMPRESSION: Delayed gastric emptying study. Electronically Signed   By: Marcello Moores  Register   On: 04/22/2017 14:12   Ct Abdomen Pelvis W Contrast  Result Date: 04/17/2017 CLINICAL DATA:  61 year old female with nausea and vomiting. EXAM: CT ABDOMEN AND PELVIS WITH CONTRAST TECHNIQUE: Multidetector CT imaging of the abdomen and pelvis was performed using the standard protocol following bolus administration of intravenous contrast. CONTRAST:  100 cc Isovue-300 COMPARISON:  None. FINDINGS: Lower chest: The visualized lung bases are clear. No intra-abdominal free air or free fluid. Hepatobiliary: A subcentimeter hypodense lesion in the right lobe of the liver as well as a 13 mm low attenuating lesion in the left lobe of the liver are not well characterized on this CT.  MRI may provide better characterisation. There is no intrahepatic biliary ductal dilatation. The gallbladder is unremarkable. Pancreas: Unremarkable. No pancreatic ductal dilatation or surrounding inflammatory changes. Spleen: Normal in size without focal abnormality. Adrenals/Urinary Tract: The adrenal glands are unremarkable. There is a 3 cm on left renal parapelvic cyst. A small exophytic cyst is noted from the inferior pole of the left kidney. Multiple small parenchymal calcific foci noted in the upper pole of the left kidney. There is no hydronephrosis on either side. There is symmetric uptake and excretion of contrast by kidneys. The visualized ureters and urinary bladder appear unremarkable. Stomach/Bowel: There is a 2.1 x 2.5 x 3.2 cm somewhat lobulated left posterior rectal mass. There is loss of fat plane between this lesion and the rectal wall. This may represent a neoplasm arising from the rectum or an abnormal/ neoplastic lymph node. An 11 x 13 mm left perirectal rounded lesion more superiorly is also noted which may represent extension of the larger mass or a mildly enlarged lymph node. Pelvic MRI is recommended for further characterization. Oral contrast mixed with stool noted throughout the colon. There is no evidence of bowel obstruction or active inflammation. Normal appendix. There is probable duodenal diverticulum at the head of the pancreas. Vascular/Lymphatic: There is minimal aortoiliac atherosclerotic disease. The abdominal aorta and IVC are otherwise unremarkable. No portal venous gas identified. Multiple retroperitoneal adenopathy noted. A cluster of adjacent left para-aortic lymph node measure 4.2 x 3.0 x 5.5 cm. Reproductive: The uterus and ovaries are grossly unremarkable as visualized. Other: There is a 3.7 x 1.2 x 1.7 cm low attenuating lesion to the left of the in L5 vertebra posterior to the psoas muscle consistent with metastatic disease. Musculoskeletal: Multilevel degenerative  changes of the spine with osteophyte formation. The bones are osteopenic. There is degenerative changes with disc space narrowing at L5-S1. No acute fracture. There is a total left hip arthroplasty. IMPRESSION: 1. Left posterior perirectal lobulated mass concerning for rectal malignancy. A smaller nodular density superior to this mass likely represent a mildly enlarged metastatic node. MRI is recommended for further evaluation. 2. 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 A hypodense lesion to the left of the L5 posterior to the psoas muscle is also consistent with metastatic disease. 3. Multiple hepatic hypodense lesions, incompletely characterized, possibly metastatic. MRI is recommended for further evaluation. 4. No evidence of bowel obstruction or active inflammation. Normal appendix. Electronically Signed   By:  Anner Crete M.D.   On: 04/17/2017 02:04   Ct Biopsy  Result Date: 04/22/2017 INDICATION: 61 year old with history of rectal cancer. Suspicious retroperitoneal lymph nodes. EXAM: CT-GUIDED RETROPERITONEAL LYMPH NODE BIOPSY MEDICATIONS: None. ANESTHESIA/SEDATION: Moderate (conscious) sedation was employed during this procedure. A total of Versed 5.0 mg and Fentanyl 125 mcg was administered intravenously. Moderate Sedation Time: 20 minutes. The patient's level of consciousness and vital signs were monitored continuously by radiology nursing throughout the procedure under my direct supervision. FLUOROSCOPY TIME:  None COMPLICATIONS: None immediate. PROCEDURE: Informed written consent was obtained from the patient after a thorough discussion of the procedural risks, benefits and alternatives. All questions were addressed. A timeout was performed prior to the initiation of the procedure. Patient was placed prone on the CT scanner. Images through the abdomen were obtained. Left periaortic retroperitoneal lymph nodes were targeted for biopsy. The  left side of the back was prepped and draped in a sterile fashion. Skin was anesthetized with 1% lidocaine. 17 gauge coaxial needle directed into the retroperitoneal space with CT guidance. Needle was positioned just posterior to a cluster of abnormal retroperitoneal lymph nodes. A total of 4 core biopsies were obtained with an 18 gauge device. Specimens placed in formalin. 17 gauge needle was removed without complication. Bandage placed over the puncture site. FINDINGS: Enlarged left retroperitoneal lymph nodes. Needle position confirmed just posterior to these lymph nodes. Four core biopsies obtained. No significant bleeding following the core biopsies. IMPRESSION: Successful CT-guided core biopsy of the left retroperitoneal lymphadenopathy. Electronically Signed   By: Markus Daft M.D.   On: 04/22/2017 18:02   Dg Abd Portable 1v  Result Date: 04/19/2017 CLINICAL DATA:  Nausea vomiting for 2 days. History of colorectal cancer. EXAM: PORTABLE ABDOMEN - 1 VIEW COMPARISON:  CT of the abdomen pelvis 04/17/2017 FINDINGS: The bowel gas pattern is normal. Scattered radiodense material throughout the colon and stomach. Small amount of formed stool in the right colon and proximal transverse colon. Left hip prosthesis noted. IMPRESSION: Nonobstructive bowel gas pattern. Scattered radiodense material throughout the colon and stomach, likely within GI contents. Electronically Signed   By: Fidela Salisbury M.D.   On: 04/19/2017 11:48   Dg C-arm 61-120 Min-no Report  Result Date: 03/25/2017 Fluoroscopy was utilized by the requesting physician.  No radiographic interpretation.   Dg Hip Port Unilat With Pelvis 1v Left  Result Date: 03/25/2017 CLINICAL DATA:  Status post left total hip replacement. EXAM: DG HIP (WITH OR WITHOUT PELVIS) 1V PORT LEFT COMPARISON:  None. FINDINGS: Left total hip prosthesis in satisfactory position and alignment. There is fragmentation of the superior aspect of the greater trochanter.  Diffuse osteopenia. IMPRESSION: Satisfactory appearance of a left total hip prosthesis with fragmentation of the superior aspect of the left greater trochanter. Electronically Signed   By: Claudie Revering M.D.   On: 03/25/2017 10:57     Discharge Exam: Vitals:   04/22/17 2125 04/23/17 0511  BP: 134/71 127/70  Pulse: 99 88  Resp: 18 16  Temp: 98.8 F (37.1 C) 98.3 F (36.8 C)   Vitals:   04/22/17 1716 04/22/17 1722 04/22/17 2125 04/23/17 0511  BP: 126/78 125/74 134/71 127/70  Pulse: (!) 120 (!) 120 99 88  Resp: 16 20 18 16   Temp:   98.8 F (37.1 C) 98.3 F (36.8 C)  TempSrc:   Oral Oral  SpO2: 99% 100% 98% 100%  Weight:      Height:        General: Pt is  alert, awake, not in acute distress Cardiovascular: RRR, S1/S2 +, no rubs, no gallops Respiratory: CTA bilaterally, no wheezing, no rhonchi Abdominal: Soft, NT, ND, bowel sounds + Extremities: no edema, no cyanosis  The results of significant diagnostics from this hospitalization (including imaging, microbiology, ancillary and laboratory) are listed below for reference.     Microbiology: Recent Results (from the past 240 hour(s))  Urine culture     Status: Abnormal   Collection Time: 04/16/17  9:24 PM  Result Value Ref Range Status   Specimen Description URINE, RANDOM  Final   Special Requests NONE  Final   Culture MULTIPLE SPECIES PRESENT, SUGGEST RECOLLECTION (A)  Final   Report Status 04/18/2017 FINAL  Final     Labs: BNP (last 3 results) No results for input(s): BNP in the last 8760 hours. Basic Metabolic Panel:  Recent Labs Lab 04/18/17 0624 04/19/17 0551 04/20/17 0536 04/21/17 0525 04/22/17 0529 04/23/17 0536  NA 138 135 136 136 139 138  K 3.0* 2.7* 3.0* 2.9* 3.3* 3.7  CL 101 102 104 103 107 107  CO2 24 23 22 23 26 25   GLUCOSE 106* 102* 74 93 99 99  BUN <5* <5* 6 <5* <5* <5*  CREATININE 0.51 0.47 0.55 0.42* 0.48 0.46  CALCIUM 9.4 8.9 8.7* 8.8* 8.8* 8.9  MG 1.9  --   --   --   --   --    Liver  Function Tests:  Recent Labs Lab 04/16/17 2123 04/22/17 0529  AST 18 14*  ALT 9* 8*  ALKPHOS 102 74  BILITOT 0.4 0.9  PROT 7.9 6.2*  ALBUMIN 4.0 3.4*    Recent Labs Lab 04/16/17 2123  LIPASE 17    Recent Labs Lab 04/17/17 0638  AMMONIA 11   CBC:  Recent Labs Lab 04/16/17 2123 04/17/17 0641 04/17/17 0941 04/18/17 0624 04/19/17 0551 04/22/17 0529  WBC 9.6 10.1 10.2 10.5 10.3 9.3  NEUTROABS 8.4*  --   --   --   --  6.9  HGB 11.4* 11.1* 11.2* 12.5 12.0 11.2*  HCT 33.7* 33.0* 32.5* 36.4 34.8* 33.6*  MCV 79.7 79.3 80.4 80.5 80.4 81.8  PLT 470* 484* 448* 510* 457* 411*   Cardiac Enzymes: No results for input(s): CKTOTAL, CKMB, CKMBINDEX, TROPONINI in the last 168 hours. BNP: Invalid input(s): POCBNP CBG:  Recent Labs Lab 04/20/17 0651 04/20/17 0733 04/21/17 0812 04/22/17 0750 04/23/17 0750  GLUCAP 68 86 94 80 106*   D-Dimer No results for input(s): DDIMER in the last 72 hours. Hgb A1c No results for input(s): HGBA1C in the last 72 hours. Lipid Profile No results for input(s): CHOL, HDL, LDLCALC, TRIG, CHOLHDL, LDLDIRECT in the last 72 hours. Thyroid function studies No results for input(s): TSH, T4TOTAL, T3FREE, THYROIDAB in the last 72 hours.  Invalid input(s): FREET3 Anemia work up No results for input(s): VITAMINB12, FOLATE, FERRITIN, TIBC, IRON, RETICCTPCT in the last 72 hours. Urinalysis    Component Value Date/Time   COLORURINE YELLOW 04/16/2017 2124   APPEARANCEUR CLOUDY (A) 04/16/2017 2124   LABSPEC 1.009 04/16/2017 2124   PHURINE 8.0 04/16/2017 2124   GLUCOSEU NEGATIVE 04/16/2017 2124   HGBUR SMALL (A) 04/16/2017 2124   BILIRUBINUR NEGATIVE 04/16/2017 2124   KETONESUR 20 (A) 04/16/2017 2124   PROTEINUR NEGATIVE 04/16/2017 2124   NITRITE NEGATIVE 04/16/2017 2124   LEUKOCYTESUR NEGATIVE 04/16/2017 2124   Sepsis Labs Invalid input(s): PROCALCITONIN,  WBC,  LACTICIDVEN Microbiology Recent Results (from the past 240 hour(s))  Urine  culture  Status: Abnormal   Collection Time: 04/16/17  9:24 PM  Result Value Ref Range Status   Specimen Description URINE, RANDOM  Final   Special Requests NONE  Final   Culture MULTIPLE SPECIES PRESENT, SUGGEST RECOLLECTION (A)  Final   Report Status 04/18/2017 FINAL  Final     Time coordinating discharge: 25 minutes  SIGNED:  Chipper Oman, MD  [facility] 04/23/2017, 3:15 PM  Pager please text page via  [email or web address] Password [email or web address]

## 2017-04-23 NOTE — Progress Notes (Signed)
PT Cancellation Note  Patient Details Name: [name] MRN: [id number] DOB: 20-Apr-1956   Cancelled Treatment:     pt declined any OOB attempt (even toileting).  NT reported pt amb to and from bathroom earlier but required MAX encouragement.  Will reattempt another day as schedule permits.    Rica Koyanagi  PTA [facility]  Acute  Rehab Pager      [phone]

## 2017-04-23 NOTE — Progress Notes (Signed)
OT Cancellation Note  Patient Details Name: [name] MRN: [id number] DOB: 03-29-1956   Cancelled Treatment:    Reason Eval/Treat Not Completed: Other (comment); Pt planning for DC home this afternoon with daughter, declining needing assistance with dressing prior to DC (daughter wishing to assist). Will check back at a later date should Pt remain in acute setting.   Lou Cal, OT Pager [phone] 04/23/2017   [name] 04/23/2017, 3:49 PM

## 2017-04-23 NOTE — Progress Notes (Signed)
OT Cancellation Note  Patient Details Name: [name] MRN: [id number] DOB: [birth year]   Cancelled Treatment:    Reason Eval/Treat Not Completed: Other (comment); Pt reports feeling nauseous, reports she has recently been up to use bathroom; encouraged EOB (vs OOB) ADLs with Pt also declining. Will check back later this afternoon.   Lou Cal, OT Pager [phone] 04/23/2017   [name] 04/23/2017, 1:55 PM

## 2017-04-23 NOTE — Care Management Note (Signed)
Case Management Note  Patient Details  Name: Sharnetta Gielow MRN: [id number] Date of Birth: 07-Feb-1956  Subjective/Objective:  [facility] rep Tim aware of [place] orders. D/c home. No further CM needs.                  Action/Plan:d/c home w/HHC.   Expected Discharge Date:                  Expected Discharge Plan:  [place]  In-House Referral:     Discharge planning Services  CM Consult  Post Acute Care Choice:  [place] ([facility], has rw,cane,3n1) Choice offered to:     DME Arranged:    DME Agency:     HH Arranged:  PT, OT HH Agency:  [facility] (formerly [employer])  Status of Service:  Completed, signed off  If discussed at [employer], dates discussed:    Additional Comments:  Dessa Phi, RN 04/23/2017, 11:31 AM

## 2017-04-26 ENCOUNTER — Telehealth: Payer: Self-pay | Admitting: Hematology

## 2017-04-26 NOTE — Telephone Encounter (Signed)
Scheduled hosp f/u 5/9 @ 1:30 pm per message from YF to NP scheduler. Per YF called dtr [name] with appointment. Left message and mailed schedule.

## 2017-04-30 ENCOUNTER — Other Ambulatory Visit: Payer: Self-pay | Admitting: *Deleted

## 2017-04-30 ENCOUNTER — Ambulatory Visit (HOSPITAL_BASED_OUTPATIENT_CLINIC_OR_DEPARTMENT_OTHER): Payer: Federal, State, Local not specified - PPO

## 2017-04-30 ENCOUNTER — Ambulatory Visit (HOSPITAL_BASED_OUTPATIENT_CLINIC_OR_DEPARTMENT_OTHER): Payer: Federal, State, Local not specified - PPO | Admitting: Hematology

## 2017-04-30 DIAGNOSIS — C19 Malignant neoplasm of rectosigmoid junction: Secondary | ICD-10-CM | POA: Diagnosis not present

## 2017-04-30 DIAGNOSIS — C210 Malignant neoplasm of anus, unspecified: Secondary | ICD-10-CM

## 2017-04-30 DIAGNOSIS — C787 Secondary malignant neoplasm of liver and intrahepatic bile duct: Secondary | ICD-10-CM

## 2017-04-30 DIAGNOSIS — R51 Headache: Secondary | ICD-10-CM

## 2017-04-30 DIAGNOSIS — G893 Neoplasm related pain (acute) (chronic): Secondary | ICD-10-CM | POA: Diagnosis not present

## 2017-04-30 DIAGNOSIS — C772 Secondary and unspecified malignant neoplasm of intra-abdominal lymph nodes: Secondary | ICD-10-CM

## 2017-04-30 DIAGNOSIS — K5903 Drug induced constipation: Secondary | ICD-10-CM

## 2017-04-30 DIAGNOSIS — R110 Nausea: Secondary | ICD-10-CM

## 2017-04-30 DIAGNOSIS — R1115 Cyclical vomiting syndrome unrelated to migraine: Secondary | ICD-10-CM

## 2017-04-30 DIAGNOSIS — Z7189 Other specified counseling: Secondary | ICD-10-CM

## 2017-04-30 LAB — COMPREHENSIVE METABOLIC PANEL
ALT: 7 U/L (ref 0–55)
AST: 11 U/L (ref 5–34)
Albumin: 3.3 g/dL — ABNORMAL LOW (ref 3.5–5.0)
Alkaline Phosphatase: 80 U/L (ref 40–150)
Anion Gap: 8 mEq/L (ref 3–11)
BUN: 6 mg/dL — AB (ref 7.0–26.0)
CALCIUM: 9.3 mg/dL (ref 8.4–10.4)
CHLORIDE: 106 meq/L (ref 98–109)
CO2: 28 meq/L (ref 22–29)
CREATININE: 0.7 mg/dL (ref 0.6–1.1)
EGFR: >90 mL/min/{1.73_m2} (ref >90)
Glucose: 107 mg/dl (ref 70–140)
POTASSIUM: 3.7 meq/L (ref 3.5–5.1)
Sodium: 142 mEq/L (ref 136–145)
Total Bilirubin: 0.37 mg/dL (ref 0.20–1.20)
Total Protein: 6.5 g/dL (ref 6.4–8.3)

## 2017-04-30 LAB — MAGNESIUM: MAGNESIUM: 1.8 mg/dL (ref 1.5–2.5)

## 2017-04-30 LAB — CBC WITH DIFFERENTIAL/PLATELET
BASO%: 0.5 % (ref 0.0–2.0)
BASOS ABS: 0 10*3/uL (ref 0.0–0.1)
EOS%: 1.1 % (ref 0.0–7.0)
Eosinophils Absolute: 0.1 10*3/uL (ref 0.0–0.5)
HEMATOCRIT: 34 % — AB (ref 34.8–46.6)
HGB: 11.2 g/dL — ABNORMAL LOW (ref 11.6–15.9)
LYMPH%: 13.2 % — AB (ref 14.0–49.7)
MCH: 27.8 pg (ref 25.1–34.0)
MCHC: 32.9 g/dL (ref 31.5–36.0)
MCV: 84.4 fL (ref 79.5–101.0)
MONO#: 1 10*3/uL — ABNORMAL HIGH (ref 0.1–0.9)
MONO%: 13.4 % (ref 0.0–14.0)
NEUT#: 5.5 10*3/uL (ref 1.5–6.5)
NEUT%: 71.8 % (ref 38.4–76.8)
Platelets: 412 10*3/uL — ABNORMAL HIGH (ref 145–400)
RBC: 4.02 10*6/uL (ref 3.70–5.45)
RDW: 19.2 % — ABNORMAL HIGH (ref 11.2–14.5)
WBC: 7.7 10*3/uL (ref 3.9–10.3)
lymph#: 1 10*3/uL (ref 0.9–3.3)

## 2017-04-30 MED ORDER — ONDANSETRON HCL 8 MG PO TABS
ORAL_TABLET | ORAL | Status: AC
Start: 2017-04-30 — End: 2017-04-30
  Filled 2017-04-30: qty 1

## 2017-04-30 MED ORDER — SODIUM CHLORIDE 0.9 % IV SOLN
INTRAVENOUS | Status: AC
  Administered 2017-04-30: 16:00:00 via INTRAVENOUS

## 2017-04-30 MED ORDER — ONDANSETRON HCL 8 MG PO TABS: 8.0000 mg | ORAL_TABLET | Freq: Once | ORAL | 0 refills | Status: DC

## 2017-04-30 MED ORDER — DRONABINOL 2.5 MG PO CAPS: 2.5000 mg | ORAL_CAPSULE | Freq: Two times a day (BID) | ORAL | 0 refills | Status: DC

## 2017-04-30 MED ORDER — ONDANSETRON HCL 8 MG PO TABS
8.0000 mg | ORAL_TABLET | Freq: Once | ORAL | Status: AC
  Administered 2017-04-30: 8 mg via ORAL

## 2017-04-30 MED ORDER — PROMETHAZINE HCL 25 MG PO TABS
25.0000 mg | ORAL_TABLET | Freq: Once | ORAL | Status: AC
  Administered 2017-04-30: 25 mg via ORAL

## 2017-04-30 MED ORDER — PROMETHAZINE HCL 25 MG PO TABS
ORAL_TABLET | ORAL | Status: AC
  Filled 2017-04-30: qty 1

## 2017-04-30 NOTE — Progress Notes (Signed)
[place]  [phone] [phone]  Clinic Follow up Note   Patient Care Team: Damaris Hippo, MD as PCP - General (Family Medicine) 04/30/2017  SUMMARY OF ONCOLOGIC HISTORY: Oncology History   cT2N2M0 stage IIIB      Anal cancer ([place])   03/09/2015 Initial Diagnosis    Rectal cancer ([place])      04/19/2015 Imaging    PET scan showed hypermetabolic rectal lesion and inguinal lymph nodes      05/08/2015 - 07/13/2015 Radiation Therapy    59 Gy in 35 fractions to pelvis and inguinal LNs for her anal cancer. She tolerated treatment poorly with persistent nausea, vomiting, diarrhea and a poor appetite throughout treatment, and required several hospitalization and treatment delays. She did complete the planned treatment.       05/08/2015 - 06/2015 Chemotherapy    Concurrent chemotherapy with 5-FU and mitomycin along with radiation. Pt tolerated poorly.       01/08/2016 Imaging    PET scan showed you hypermetabolic left mediastinal 1L adenopathy (1.5cm), partial response to therapy with interval decrease in extent of hypermetabolic rectal soft tissue fullness as well as resolution of inguinal adenopathy.      02/06/2017 Procedure    Colonoscopy showed scar tissue just off anal verge, no lesion otherwise.      04/16/2017 - 04/23/2017 Hospital Admission    Admitted 04/16/17 - 04/23/17 for Nausea and Vomiting and abdominal pain. CT abd noted to have findings including rectal mass and enlarged nodes. Patient underwent sigmoidoscopy with biopsy showing benign findings. IR was consulted for node biopsy and which was done and awaiting results.      04/17/2017 Imaging    CT AP W Contrast 04/17/17: IMPRESSION: 1. Left posterior perirectal lobulated mass concerning for rectal malignancy. A smaller nodular density superior to this mass likely represent a mildly enlarged metastatic node. MRI is recommended for further evaluation. 2. Retroperitoneal adenopathy most  consistent with metastatic disease, likely from rectal neoplasm. A hypodense lesion to the left of the L5 posterior to the psoas muscle is also consistent with metastatic disease. 3. Multiple hepatic hypodense lesions, incompletely characterized, possibly metastatic. MRI is recommended for further evaluation. 4. No evidence of bowel obstruction or active inflammation. Normal appendix.      04/22/2017 Pathology Results    Diagnosis Lymph node, needle/core biopsy, left retroperitoneum periaortic - POORLY DIFFERENTIATED SQUAMOUS CELL CARCINOMA - SEE COMMENT Microscopic Comment The needle core biopsy has a basaloid neoplasm with increased mitoses and necrosis. There is no residual lymph node parenchyma identified. The neoplasm is immunoreactive for cytokeratin 7 (patchy), cytokeratin 5/6, and p16 but negative for CDX2 and cytokeratin 20. Overall, the histology and immunophenotype support the diagnosis of a poorly differentiated squamous cell carcinoma.       History of Present Illness (04/17/17) [name] is a 61 y.o. 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 I was called to see her in hospital.   She is from [place], with past medical history of DVT on Xarelto, rectal cancer status post chemotherapy and radiation in 2016, stroke, asthma, who was admitted to hospital last night for nausea, vomiting and abdominal pain. CT of abdomen and pelvis showed left posterior perirectal lobulated mass concerning for rectum malignancy. Retroperitoneal adenopathy, I hypodense lesion to the left L5 to the sore S muscle, suspicious for metastasis, and multiple hepatic hypodense lesions, indeterminate. I was called to see the patient to ruled out metastatic  rectal cancer recurrence. I saw the patient before her endoscopy today.  The patient reports she has a prior history of rectal cancer stage 3, squamous cell, which was treated with  chemotherapy and radiation therapy. She did not have surgery. The patient reports she completed treatment in September 2016. The patient has not used her port since 2016, though it is still in place. She reports the last time she followed up with physicians for her prior cancer, they told her she had suspicious masses in her lungs and liver.  The patient does not have any medical records from [place], where she previously lived and was treated, but reports her daughter would have them. She reports she does not know if she plans to stay in [place], or if she will return to [place].    Current Treatment: Starting Nivolumab week of 05/12/17   INTERVAL HISTORY: Seraya Jobst is here for a follow up. She presents to the clinic today with her daughter. She was discharged home on 5/2 after biopsy. Today she has sever nausea. Her daughter reports she has been at home since she left the hospital. She is not eating much. Her daughter reports she forgot her nausea medication today. Her daughter reports the patient having headaches and her stomach hurting a part of her bowls not pushing food through like it should. Her daughter will pick her Lactulos prescription, an anti-constipation medication. In addition to oxycodone medication. She received Zofran in clinic today for her nausea. She did vomit in clinic today. The patient is not a fan of chemo therapy and is looking for more time on treatment. Her daughter reports that she responds horrible to [place]. She will normally drink 2-3 cups of water a day. Her daughter is really concerned with her headache as well. She says they get sever and sees a bright light then follow with her vomiting.  Her daughter is looking into home hospice care but because this is Palliative care and her prognosis is more than 6 months she is not eligible for Hospice. They use [facility] with CNAs. The headache pain is sever before vomiting.    REVIEW OF SYSTEMS:     Constitutional: Denies fevers, chills or abnormal weight loss (+) sever headaches/migraines secondary to nausea (+) loss of appetite Eyes: Denies blurriness of vision Ears, nose, mouth, throat, and face: Denies mucositis or sore throat Respiratory: Denies cough, dyspnea or wheezes Cardiovascular: Denies palpitation, chest discomfort or lower extremity swelling Gastrointestinal:  Denies heartburn (+) stomach pain (+) constipatoin (+) sever nausea  Skin: Denies abnormal skin rashes Lymphatics: Denies new lymphadenopathy or easy bruising Neurological:Denies numbness, tingling or new weaknesses Behavioral/Psych: Mood is stable, no new changes  All other systems were reviewed with the patient and are negative.  MEDICAL HISTORY:  Past Medical History:  Diagnosis Date  . Anxiety   . Arthritis   . Asthma    childhood   . Cancer [facility])    colorectal cancer with chemo and radiation   . DVT (deep venous thrombosis) ([place])   . Dyspnea    with anxiety and exertion  . GERD (gastroesophageal reflux disease)   . Neuromuscular disorder ([facility])    poly neuropathy   . Stroke [facility]) 2010, 2011   TIA  x2    SURGICAL HISTORY: Past Surgical History:  Procedure Laterality Date  . CESAREAN SECTION    . FLEXIBLE SIGMOIDOSCOPY N/A 04/17/2017   Procedure: FLEXIBLE SIGMOIDOSCOPY;  Surgeon: Ronald Lobo, MD;  Location: [facility] ENDOSCOPY;  Service: Endoscopy;  Laterality: N/A;  . THYROID CYST EXCISION    . TOTAL HIP ARTHROPLASTY Left 03/25/2017   Procedure: LEFT TOTAL HIP ARTHROPLASTY ANTERIOR APPROACH;  Surgeon: Paralee Cancel, MD;  Location: [facility] ORS;  Service: Orthopedics;  Laterality: Left;  Requests 70 mins    I have reviewed the social history and family history with the patient and they are unchanged from previous note.  ALLERGIES:  is allergic to penicillins.  MEDICATIONS:  Current Outpatient Prescriptions  Medication Sig Dispense Refill  . doxycycline (VIBRAMYCIN) 100 MG capsule Take 1 capsule (100 mg  total) by mouth 2 (two) times daily. 60 capsule 1  . Efinaconazole (JUBLIA) 10 % SOLN Apply 1 application topically daily.    . ferrous sulfate (FERROUSUL) 325 (65 FE) MG tablet Take 1 tablet (325 mg total) by mouth 3 (three) times daily with meals.    Marland Kitchen HYDROcodone-acetaminophen (NORCO) 7.5-325 MG tablet Take 1-2 tablets by mouth every 4 (four) hours as needed for moderate pain or severe pain. 60 tablet 0  . metoCLOPramide (REGLAN) 10 MG tablet Take 1 tablet (10 mg total) by mouth 4 (four) times daily -  before meals and at bedtime. 120 tablet 0  . ondansetron (ZOFRAN-ODT) 4 MG disintegrating tablet Take 1 tablet (4 mg total) by mouth every 8 (eight) hours as needed for nausea or vomiting. 20 tablet 0  . polyethylene glycol (MIRALAX / GLYCOLAX) packet Take 17 g by mouth 2 (two) times daily. 14 each 0  . rivaroxaban (XARELTO) 20 MG TABS tablet Take 1 tablet (20 mg total) by mouth daily with supper. Please follow up with PCP to continue this medication. 30 tablet 0  . saccharomyces boulardii (FLORASTOR) 250 MG capsule Take 250 mg by mouth daily.    Marland Kitchen bismuth subsalicylate (PEPTO BISMOL) 262 MG chewable tablet Chew 524 mg by mouth daily as needed for indigestion or diarrhea or loose stools.    . docusate sodium (COLACE) 100 MG capsule Take 1 capsule (100 mg total) by mouth 2 (two) times daily. (Patient not taking: Reported on 04/30/2017) 10 capsule 0  . dronabinol (MARINOL) 2.5 MG capsule Take 1 capsule (2.5 mg total) by mouth 2 (two) times daily before a meal. 40 capsule 0  . LACTULOSE PO Take by mouth.    . Methenamine-Sodium Salicylate (AZO URINARY TRACT DEFENSE PO) Take 2 tablets by mouth daily as needed (uti symptoms).    . methocarbamol (ROBAXIN) 500 MG tablet Take 1 tablet (500 mg total) by mouth every 6 (six) hours as needed for muscle spasms. (Patient not taking: Reported on 04/30/2017) 40 tablet 0  . omeprazole (PRILOSEC) 20 MG capsule Take 20 mg by mouth daily.     No current  facility-administered medications for this visit.     PHYSICAL EXAMINATION: ECOG PERFORMANCE STATUS: 3 - Symptomatic, >50% confined to bed BP: 140/69  P: 96   R: 16  T: 97.8  O2: 100 GENERAL:alert, moderately distress due to nausea and abdominal discomfort  SKIN: skin color, texture, turgor are normal, no rashes or significant lesions EYES: normal, Conjunctiva are pink and non-injected, sclera clear OROPHARYNX:no exudate, no erythema and lips, buccal mucosa, and tongue normal  NECK: supple, thyroid normal size, non-tender, without nodularity LYMPH:  no palpable lymphadenopathy in the cervical, axillary or inguinal LUNGS: clear to auscultation and percussion with normal breathing effort HEART: regular rate & rhythm and no murmurs and no lower extremity edema ABDOMEN:abdomen soft, mild diffuse tenderness, normal bowel sounds Musculoskeletal:no cyanosis of digits and  no clubbing  NEURO: alert & oriented x 3 with fluent speech, no focal motor/sensory deficits  LABORATORY DATA:  I have reviewed the data as listed CBC Latest Ref Rng & Units 04/30/2017 04/22/2017 04/19/2017  WBC 3.9 - 10.3 10e3/uL 7.7 9.3 10.3  Hemoglobin 11.6 - 15.9 g/dL 11.2(L) 11.2(L) 12.0  Hematocrit 34.8 - 46.6 % 34.0(L) 33.6(L) 34.8(L)  Platelets 145 - 400 10e3/uL 412(H) 411(H) 457(H)     CMP Latest Ref Rng & Units 04/30/2017 04/23/2017 04/22/2017  Glucose 70 - 140 mg/dl 107 99 99  BUN 7.0 - 26.0 mg/dL 6.0(L) <5(L) <5(L)  Creatinine 0.6 - 1.1 mg/dL 0.7 0.46 0.48  Sodium 136 - 145 mEq/L 142 138 139  Potassium 3.5 - 5.1 mEq/L 3.7 3.7 3.3(L)  Chloride 101 - 111 mmol/L - 107 107  CO2 22 - 29 mEq/L 28 25 26   Calcium 8.4 - 10.4 mg/dL 9.3 8.9 8.8(L)  Total Protein 6.4 - 8.3 g/dL 6.5 - 6.2(L)  Total Bilirubin 0.20 - 1.20 mg/dL 0.37 - 0.9  Alkaline Phos 40 - 150 U/L 80 - 74  AST 5 - 34 U/L 11 - 14(L)  ALT 0 - 55 U/L 7 - 8(L)   PATHOLOGY:   Pathology Results: 04/22/17 Diagnosis Lymph node, needle/core biopsy, left  retroperitoneum periaortic - POORLY DIFFERENTIATED SQUAMOUS CELL CARCINOMA - SEE COMMENT Microscopic Comment The needle core biopsy has a basaloid neoplasm with increased mitoses and necrosis. There is no residual lymph node parenchyma identified. The neoplasm is immunoreactive for cytokeratin 7 (patchy), cytokeratin 5/6, and p16 but negative for CDX2 and cytokeratin 20. Overall, the histology and immunophenotype support the diagnosis of a poorly differentiated squamous cell carcinoma.   RADIOGRAPHIC STUDIES: I have personally reviewed the radiological images as listed and agreed with the findings in the report. No results found.     CT AP W Contrast 04/17/17: IMPRESSION: 1. Left posterior perirectal lobulated mass concerning for rectal malignancy. A smaller nodular density superior to this mass likely represent a mildly enlarged metastatic node. MRI is recommended for further evaluation. 2. 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 A hypodense lesion to the left of the L5 posterior to the psoas muscle is also consistent with metastatic disease. 3. Multiple hepatic hypodense lesions, incompletely characterized, possibly metastatic. MRI is recommended for further evaluation. 4. No evidence of bowel obstruction or active inflammation. Normal appendix.  ASSESSMENT & PLAN:  [name] is a 62 y.o. female from [place], with past medical history of DVT on Xarelto, rectal squamous cell carcinoma status post chemotherapy and radiation in 2016, stroke, asthma, who was admitted to hospital recently for nausea, vomiting and abdominal pain. She has had those symptoms since her recent hip surgery on 04/21/2017.   1. 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 -We reviewed her recent CT scan findings, and her retroperitoneal lymph node biopsy results in details with patient and her daughter. -Unfortunately, biopsy confirmed metastatic disease to abdominal lymph nodes. Her CT scan is also suspicious for liver and pelvic metastasis. She was not able to tolerate MRI to evaluate her liver lesions. -I recommend a PET scan to complete his staging  -We discussed the incurable nature of her disease at this stage, and the goal of therapy is palliative. -We discussed the systemic treatment  options, including chemotherapy, such as 5-FU and cisplatin, FOLFOX, carboplatin and Taxol, and immunotherapy with Nivolumab or Keytruda  -Due to her poor performance status, malnourishment, she is not a candidate for cytotoxic chemotherapy for now.  -I recommend her to try Nivolumab, we discussed the clinical data, immunotherapy is now recommended by [facility] as second line treatment after chemotherapy. -Potential side effects of Nivolumab, which includes but not limited to, fatigue, skin rash, hypothyroidism, autoimmune disease, pneumonitis, etc, were discussed with patient and her daughter, she agrees to try. -We also discussed palliative care alone, due to her poor performance status, she is a candidate for hospice also. After lengthy discussion, patient would like to try Nivolumab first. -I'll tentatively schedule her treatment to be started in 1-2 weeks.  2. Nausea, abdominal pain, secondary to constipation and metastatic cancer -Due to her sever nausea I have prescribed her Marinol 2.5mg  twice a day. -She'll continue Reglan -I will give her IVF and iv for neck and into clinic today.  3. Headaches  -secondary to nausea  -when the headache is bad she sees bright light  -Will monitor is Marinol aids in the nausea and headaches  4. Goal of care discussion  -We again discussed the incurable nature of her  cancer, and the overall poor prognosis, especially if she does not have good response to chemotherapy or progress on chemo -The patient understands the goal of care is palliative. -I recommend DNR/DNI, she will think about it    PLAN:  -Order PET scan in one week -Lab, f/u and first dose of Nivolumab the week of 5/21 and 2 weeks later -Lab, and IVF today -Chemo class and IV Fluid next week   Orders Placed This Encounter  Procedures  . NM PET Image Initial (PI) Skull Base To Thigh    Standing Status:   Future    Standing Expiration Date:   04/30/2018    Order Specific Question:   Reason for Exam (SYMPTOM  OR DIAGNOSIS REQUIRED)    Answer:   staging    Order Specific Question:   If indicated for the ordered procedure, I authorize the administration of a radiopharmaceutical per Radiology protocol    Answer:   Yes    Order Specific Question:   Is the patient pregnant?    Answer:   No    Order Specific Question:   Preferred imaging location?    Answer:   [facility]    Order Specific Question:   Radiology Contrast Protocol - do NOT remove file path    Answer:   [email or web address]   All questions were answered. The patient knows to call the clinic with any problems, questions or concerns. No barriers to learning was detected.  I spent 40 minutes counseling the patient face to face. The total time spent in the appointment was 45 minutes and more than 50% was on counseling and review of test results  This document serves as a record of services personally performed by Truitt Merle, MD. It was created on her behalf by Joslyn Devon, a trained medical scribe. The creation of this record is based on the scribe's personal observations and the provider's statements to them. This document has been checked and approved by the attending provider.     Truitt Merle, MD 04/30/2017

## 2017-04-30 NOTE — Patient Instructions (Signed)
Dehydration, Adult Dehydration is when there is not enough fluid or water in your body. This happens when you lose more fluids than you take in. Dehydration can range from mild to very bad. It should be treated right away to keep it from getting very bad. Symptoms of mild dehydration may include:   Thirst.  Dry lips.  Slightly dry mouth.  Dry, warm skin.  Dizziness. Symptoms of moderate dehydration may include:   Very dry mouth.  Muscle cramps.  Dark pee (urine). Pee may be the color of tea.  Your body making less pee.  Your eyes making fewer tears.  Heartbeat that is uneven or faster than normal (palpitations).  Headache.  Light-headedness, especially when you stand up from sitting.  Fainting (syncope). Symptoms of very bad dehydration may include:   Changes in skin, such as:  Cold and clammy skin.  Blotchy (mottled) or pale skin.  Skin that does not quickly return to normal after being lightly pinched and let go (poor skin turgor).  Changes in body fluids, such as:  Feeling very thirsty.  Your eyes making fewer tears.  Not sweating when body temperature is high, such as in hot weather.  Your body making very little pee.  Changes in vital signs, such as:  Weak pulse.  Pulse that is more than 100 beats a minute when you are sitting still.  Fast breathing.  Low blood pressure.  Other changes, such as:  Sunken eyes.  Cold hands and feet.  Confusion.  Lack of energy (lethargy).  Trouble waking up from sleep.  Short-term weight loss.  Unconsciousness. Follow these instructions at home:  If told by your doctor, drink an ORS:  Make an ORS by using instructions on the package.  Start by drinking small amounts, about  cup (120 mL) every 5-10 minutes.  Slowly drink more until you have had the amount that your doctor said to have.  Drink enough clear fluid to keep your pee clear or pale yellow. If you were told to drink an ORS, finish the  ORS first, then start slowly drinking clear fluids. Drink fluids such as:  Water. Do not drink only water by itself. Doing that can make the salt (sodium) level in your body get too low (hyponatremia).  Ice chips.  Fruit juice that you have added water to (diluted).  Low-calorie sports drinks.  Avoid:  Alcohol.  Drinks that have a lot of sugar. These include high-calorie sports drinks, fruit juice that does not have water added, and soda.  Caffeine.  Foods that are greasy or have a lot of fat or sugar.  Take over-the-counter and prescription medicines only as told by your doctor.  Do not take salt tablets. Doing that can make the salt level in your body get too high (hypernatremia).  Eat foods that have minerals (electrolytes). Examples include bananas, oranges, potatoes, tomatoes, and spinach.  Keep all follow-up visits as told by your doctor. This is important. Contact a doctor if:  You have belly (abdominal) pain that:  Gets worse.  Stays in one area (localizes).  You have a rash.  You have a stiff neck.  You get angry or annoyed more easily than normal (irritability).  You are more sleepy than normal.  You have a harder time waking up than normal.  You feel:  Weak.  Dizzy.  Very thirsty.  You have peed (urinated) only a small amount of very dark pee during 6-8 hours. Get help right away if:  You   have symptoms of very bad dehydration.  You cannot drink fluids without throwing up (vomiting).  Your symptoms get worse with treatment.  You have a fever.  You have a very bad headache.  You are throwing up or having watery poop (diarrhea) and it:  Gets worse.  Does not go away.  You have blood or something green (bile) in your throw-up.  You have blood in your poop (stool). This may cause poop to look black and tarry.  You have not peed in 6-8 hours.  You pass out (faint).  Your heart rate when you are sitting still is more than 100 beats a  minute.  You have trouble breathing. This information is not intended to replace advice given to you by your health care provider. Make sure you discuss any questions you have with your health care provider. Document Released: 10/05/2009 Document Revised: 06/28/2016 Document Reviewed: 02/02/2016 Elsevier Interactive Patient Education  2017 [employer]  

## 2017-05-01 ENCOUNTER — Telehealth: Payer: Self-pay

## 2017-05-01 ENCOUNTER — Other Ambulatory Visit: Payer: Self-pay | Admitting: Medical Oncology

## 2017-05-01 ENCOUNTER — Encounter: Payer: Self-pay | Admitting: Hematology

## 2017-05-01 DIAGNOSIS — C210 Malignant neoplasm of anus, unspecified: Secondary | ICD-10-CM

## 2017-05-01 DIAGNOSIS — Z7189 Other specified counseling: Secondary | ICD-10-CM | POA: Insufficient documentation

## 2017-05-01 MED ORDER — DRONABINOL 2.5 MG PO CAPS: 2.5000 mg | ORAL_CAPSULE | Freq: Two times a day (BID) | ORAL | 0 refills | Status: DC

## 2017-05-01 MED FILL — DRONABINOL 2.5 MG CAPSULE: 2.5 | 20 days supply | Qty: 40 | Fill #0

## 2017-05-01 NOTE — Progress Notes (Signed)
START OFF PATHWAY REGIMEN - Anal Carcinoma   [id number]:Nivolumab 240 mg q14 Days:   A cycle is every 14 days:     Nivolumab   **Always confirm dose/schedule in your pharmacy ordering system**    Patient Characteristics: Anal and Anal Margin Tumors, Systemic Recurrence, First Line AJCC T Category: [place] AJCC N Category: NX AJCC M Category: M1 AJCC 8 Stage Grouping: IV Current Disease Status: Systemic Recurrence Line of therapy: First Line  Intent of Therapy: Non-Curative / Palliative Intent, Discussed with Patient

## 2017-05-01 NOTE — Plan of Care (Signed)
Local pharmacy did not have correct marinol tablets so rx called to [place] who can fill it. I called [facility] and cancelled rx.

## 2017-05-01 NOTE — Telephone Encounter (Signed)
Matt Pharmacist at [facility] called to ask if the marinol rx can be increased to 5 mg daily or BID. They do not have the 2.5 mg marinol and won't receive shipment until probably Monday. They do have the 5 mg marinol.

## 2017-05-12 ENCOUNTER — Ambulatory Visit (HOSPITAL_COMMUNITY): Payer: Federal, State, Local not specified - PPO

## 2017-05-13 ENCOUNTER — Other Ambulatory Visit: Payer: Federal, State, Local not specified - PPO

## 2017-05-13 ENCOUNTER — Ambulatory Visit (HOSPITAL_BASED_OUTPATIENT_CLINIC_OR_DEPARTMENT_OTHER): Payer: Federal, State, Local not specified - PPO

## 2017-05-13 ENCOUNTER — Ambulatory Visit (HOSPITAL_COMMUNITY)
Admission: RE | Admit: 2017-05-13 | Discharge: 2017-05-13 | Disposition: A | Discharge status: Home / Self Care | Payer: Federal, State, Local not specified - PPO | Source: Ambulatory Visit | Attending: Hematology | Admitting: Hematology

## 2017-05-13 ENCOUNTER — Encounter: Payer: Self-pay | Admitting: *Deleted

## 2017-05-13 VITALS — BP 116/59 | HR 93 | Temp 98.1°F | Resp 18

## 2017-05-13 DIAGNOSIS — R590 Localized enlarged lymph nodes: Secondary | ICD-10-CM | POA: Diagnosis not present

## 2017-05-13 DIAGNOSIS — C210 Malignant neoplasm of anus, unspecified: Secondary | ICD-10-CM | POA: Diagnosis not present

## 2017-05-13 DIAGNOSIS — C772 Secondary and unspecified malignant neoplasm of intra-abdominal lymph nodes: Secondary | ICD-10-CM

## 2017-05-13 DIAGNOSIS — E860 Dehydration: Secondary | ICD-10-CM

## 2017-05-13 DIAGNOSIS — C787 Secondary malignant neoplasm of liver and intrahepatic bile duct: Secondary | ICD-10-CM

## 2017-05-13 LAB — GLUCOSE, CAPILLARY: GLUCOSE-CAPILLARY: 98 mg/dL (ref 65–99)

## 2017-05-13 MED ORDER — FLUDEOXYGLUCOSE F - 18 (FDG) INJECTION
7.0800 | Freq: Once | INTRAVENOUS | Status: AC | PRN
  Administered 2017-05-13: 7.08 via INTRAVENOUS

## 2017-05-13 MED ORDER — SODIUM CHLORIDE 0.9 % IV SOLN
INTRAVENOUS | Status: DC
  Administered 2017-05-13: 13:00:00 via INTRAVENOUS

## 2017-05-13 NOTE — Patient Instructions (Signed)
Dehydration, Adult Dehydration is a condition in which there is not enough fluid or water in the body. This happens when you lose more fluids than you take in. Important organs, such as the kidneys, brain, and heart, cannot function without a proper amount of fluids. Any loss of fluids from the body can lead to dehydration. Dehydration can range from mild to severe. This condition should be treated right away to prevent it from becoming severe. What are the causes? This condition may be caused by:  Vomiting.  Diarrhea.  Excessive sweating, such as from heat exposure or exercise.  Not drinking enough fluid, especially:  When ill.  While doing activity that requires a lot of energy.  Excessive urination.  Fever.  Infection.  Certain medicines, such as medicines that cause the body to lose excess fluid (diuretics).  Inability to access safe drinking water.  Reduced physical ability to get adequate water and food. What increases the risk? This condition is more likely to develop in people:  Who have a poorly controlled long-term (chronic) illness, such as diabetes, heart disease, or kidney disease.  Who are age 65 or older.  Who are disabled.  Who live in a place with high altitude.  Who play endurance sports. What are the signs or symptoms? Symptoms of mild dehydration may include:   Thirst.  Dry lips.  Slightly dry mouth.  Dry, warm skin.  Dizziness. Symptoms of moderate dehydration may include:   Very dry mouth.  Muscle cramps.  Dark urine. Urine may be the color of tea.  Decreased urine production.  Decreased tear production.  Heartbeat that is irregular or faster than normal (palpitations).  Headache.  Light-headedness, especially when you stand up from a sitting position.  Fainting (syncope). Symptoms of severe dehydration may include:   Changes in skin, such as:  Cold and clammy skin.  Blotchy (mottled) or pale skin.  Skin that does  not quickly return to normal after being lightly pinched and released (poor skin turgor).  Changes in body fluids, such as:  Extreme thirst.  No tear production.  Inability to sweat when body temperature is high, such as in hot weather.  Very little urine production.  Changes in vital signs, such as:  Weak pulse.  Pulse that is more than 100 beats a minute when sitting still.  Rapid breathing.  Low blood pressure.  Other changes, such as:  Sunken eyes.  Cold hands and feet.  Confusion.  Lack of energy (lethargy).  Difficulty waking up from sleep.  Short-term weight loss.  Unconsciousness. How is this diagnosed? This condition is diagnosed based on your symptoms and a physical exam. Blood and urine tests may be done to help confirm the diagnosis. How is this treated? Treatment for this condition depends on the severity. Mild or moderate dehydration can often be treated at home. Treatment should be started right away. Do not wait until dehydration becomes severe. Severe dehydration is an emergency and it needs to be treated in a hospital. Treatment for mild dehydration may include:   Drinking more fluids.  Replacing salts and minerals in your blood (electrolytes) that you may have lost. Treatment for moderate dehydration may include:   Drinking an oral rehydration solution (ORS). This is a drink that helps you replace fluids and electrolytes (rehydrate). It can be found at pharmacies and retail stores. Treatment for severe dehydration may include:   Receiving fluids through an IV tube.  Receiving an electrolyte solution through a feeding tube that is   passed through your nose and into your stomach (nasogastric tube, or NG tube).  Correcting any abnormalities in electrolytes.  Treating the underlying cause of dehydration. Follow these instructions at home:  If directed by your health care provider, drink an ORS:  Make an ORS by following instructions on the  package.  Start by drinking small amounts, about  cup (120 mL) every 5-10 minutes.  Slowly increase how much you drink until you have taken the amount recommended by your health care provider.  Drink enough clear fluid to keep your urine clear or pale yellow. If you were told to drink an ORS, finish the ORS first, then start slowly drinking other clear fluids. Drink fluids such as:  Water. Do not drink only water. Doing that can lead to having too little salt (sodium) in the body (hyponatremia).  Ice chips.  Fruit juice that you have added water to (diluted fruit juice).  Low-calorie sports drinks.  Avoid:  Alcohol.  Drinks that contain a lot of sugar. These include high-calorie sports drinks, fruit juice that is not diluted, and soda.  Caffeine.  Foods that are greasy or contain a lot of fat or sugar.  Take over-the-counter and prescription medicines only as told by your health care provider.  Do not take sodium tablets. This can lead to having too much sodium in the body (hypernatremia).  Eat foods that contain a healthy balance of electrolytes, such as bananas, oranges, potatoes, tomatoes, and spinach.  Keep all follow-up visits as told by your health care provider. This is important. Contact a health care provider if:  You have abdominal pain that:  Gets worse.  Stays in one area (localizes).  You have a rash.  You have a stiff neck.  You are more irritable than usual.  You are sleepier or more difficult to wake up than usual.  You feel weak or dizzy.  You feel very thirsty.  You have urinated only a small amount of very dark urine over 6-8 hours. Get help right away if:  You have symptoms of severe dehydration.  You cannot drink fluids without vomiting.  Your symptoms get worse with treatment.  You have a fever.  You have a severe headache.  You have vomiting or diarrhea that:  Gets worse.  Does not go away.  You have blood or green matter  (bile) in your vomit.  You have blood in your stool. This may cause stool to look black and tarry.  You have not urinated in 6-8 hours.  You faint.  Your heart rate while sitting still is over 100 beats a minute.  You have trouble breathing. This information is not intended to replace advice given to you by your health care provider. Make sure you discuss any questions you have with your health care provider. Document Released: 12/09/2005 Document Revised: 07/05/2016 Document Reviewed: 02/02/2016 [employer] Interactive Patient Education  2017 [employer]  

## 2017-05-14 ENCOUNTER — Encounter: Payer: Self-pay | Admitting: Hematology

## 2017-05-14 NOTE — Progress Notes (Signed)
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  She would like to apply so I will complete the app for [employer] and get her signature on 05/16/17.  I will fax to the foundation for processing once she's signed it.  Pt also exceeds the income requirement for the [place].  I will give her my card in case she has any questions or concerns in the future.

## 2017-05-15 NOTE — Progress Notes (Signed)
[facility]  [phone] Fax:([phone]) [phone]  Clinic Follow up Note   Patient Care Team: Damaris Hippo, MD as PCP - General (Family Medicine) 05/16/2017  SUMMARY OF ONCOLOGIC HISTORY: Oncology History   [id number] stage IIIB      Anal cancer ([facility])   03/09/2015 Initial Diagnosis    Rectal cancer ([facility])      04/19/2015 Imaging    PET scan showed hypermetabolic rectal lesion and inguinal lymph nodes      05/08/2015 - 07/13/2015 Radiation Therapy    59 Gy in 35 fractions to pelvis and inguinal LNs for her anal cancer. She tolerated treatment poorly with persistent nausea, vomiting, diarrhea and a poor appetite throughout treatment, and required several hospitalization and treatment delays. She did complete the planned treatment.       05/08/2015 - 06/2015 Chemotherapy    Concurrent chemotherapy with 5-FU and mitomycin along with radiation. Pt tolerated poorly.       01/08/2016 Imaging    PET scan showed you hypermetabolic left mediastinal 1L adenopathy (1.5cm), partial response to therapy with interval decrease in extent of hypermetabolic rectal soft tissue fullness as well as resolution of inguinal adenopathy.      02/06/2017 Procedure    Colonoscopy showed scar tissue just off anal verge, no lesion otherwise.      04/16/2017 - 04/23/2017 Hospital Admission    Admitted 04/16/17 - 04/23/17 for Nausea and Vomiting and abdominal pain. CT abd noted to have findings including rectal mass and enlarged nodes. Patient underwent sigmoidoscopy with biopsy showing benign findings. IR was consulted for node biopsy and which was done and awaiting results.      04/17/2017 Imaging    CT AP W Contrast 04/17/17: IMPRESSION: 1. Left posterior perirectal lobulated mass concerning for rectal malignancy. A smaller nodular density superior to this mass likely represent a mildly enlarged metastatic node. MRI is recommended for further evaluation. 2. Retroperitoneal adenopathy most  consistent with metastatic disease, likely from rectal neoplasm. A hypodense lesion to the left of the L5 posterior to the psoas muscle is also consistent with metastatic disease. 3. Multiple hepatic hypodense lesions, incompletely characterized, possibly metastatic. MRI is recommended for further evaluation. 4. No evidence of bowel obstruction or active inflammation. Normal appendix.      04/22/2017 Pathology Results    Diagnosis Lymph node, needle/core biopsy, left retroperitoneum periaortic - POORLY DIFFERENTIATED SQUAMOUS CELL CARCINOMA - SEE COMMENT Microscopic Comment The needle core biopsy has a basaloid neoplasm with increased mitoses and necrosis. There is no residual lymph node parenchyma identified. The neoplasm is immunoreactive for cytokeratin 7 (patchy), cytokeratin 5/6, and p16 but negative for CDX2 and cytokeratin 20. Overall, the histology and immunophenotype support the diagnosis of a poorly differentiated squamous cell carcinoma.       05/13/2017 PET scan    IMPRESSION: 1. Perirectal adenopathy and posterior perirectal hypermetabolic mass, associated with presumed metastatic adenopathy in knee left common iliac, retroperitoneal, thoracic paraesophageal, and left upper mediastinal regions. The left upper mediastinal mass extends into the left supraclavicular space. 2. Metastatic disease to the liver (best seen in segment 3) along with a hypermetabolic nodule compatible with metastatic disease in the superior segment of the lower lobe of the left lung.      History of Present Illness (04/17/17) [name] is a 61 y.o. 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 I was called to see her in hospital.   She is from [place],  with past medical history of DVT on Xarelto, rectal cancer status post chemotherapy and radiation in 2016, stroke, asthma, who was admitted to hospital last night for nausea,  vomiting and abdominal pain. CT of abdomen and pelvis showed left posterior perirectal lobulated mass concerning for rectum malignancy. Retroperitoneal adenopathy, I hypodense lesion to the left L5 to the sore S muscle, suspicious for metastasis, and multiple hepatic hypodense lesions, indeterminate. I was called to see the patient to ruled out metastatic rectal cancer recurrence. I saw the patient before her endoscopy today.  The patient reports she has a prior history of rectal cancer stage 3, squamous cell, which was treated with chemotherapy and radiation therapy. She did not have surgery. The patient reports she completed treatment in September 2016. The patient has not used her port since 2016, though it is still in place. She reports the last time she followed up with physicians for her prior cancer, they told her she had suspicious masses in her lungs and liver.  The patient does not have any medical records from [place], where she previously lived and was treated, but reports her daughter would have them. She reports she does not know if she plans to stay in [place], or if she will return to [place].    Current Treatment: Starting Nivolumab on 05/16/17   INTERVAL HISTORY: Mineola Duan is here for a follow up accompanied by her daughter, here for first cycle Nivolumab. She is feeling better since the last visit. She still experiences nausea and her medication is wearing off quicker than expected. Her appetite has improved. She eats more on some days compared to others. She hasn't been vomiting, but has been feeling "queezy." She walks around at home more. She had pain in her right leg that went away. She has pain in her hip if she doesn't take pain pills. She denies any other pain.   REVIEW OF SYSTEMS:   Constitutional: Denies fevers, chills or abnormal weight loss  (+) improved appetite Eyes: Denies blurriness of vision Ears, nose, mouth, throat, and face: Denies  mucositis or sore throat Respiratory: Denies cough, dyspnea or wheezes Cardiovascular: Denies palpitation, chest discomfort or lower extremity swelling Gastrointestinal:  Denies heartburn  (+) sever nausea  Skin: Denies abnormal skin rashes Lymphatics: Denies new lymphadenopathy or easy bruising Neurological:Denies numbness, tingling or new weaknesses Behavioral/Psych: Mood is stable, no new changes  All other systems were reviewed with the patient and are negative.  MEDICAL HISTORY:  Past Medical History:  Diagnosis Date  . Anxiety   . Arthritis   . Asthma    childhood   . Cancer [facility])    colorectal cancer with chemo and radiation   . DVT (deep venous thrombosis) ([place])   . Dyspnea    with anxiety and exertion  . GERD (gastroesophageal reflux disease)   . Neuromuscular disorder ([facility])    poly neuropathy   . Stroke [facility]) 2010, 2011   TIA  x2    SURGICAL HISTORY: Past Surgical History:  Procedure Laterality Date  . CESAREAN SECTION    . FLEXIBLE SIGMOIDOSCOPY N/A 04/17/2017   Procedure: FLEXIBLE SIGMOIDOSCOPY;  Surgeon: Ronald Lobo, MD;  Location: [facility] END[facility]COPY;  Service: Endoscopy;  Laterality: N/A;  . THYROID CYST EXCISION    . TOTAL HIP ARTHROPLASTY Left 03/25/2017   Procedure: LEFT TOTAL HIP ARTHROPLASTY ANTERIOR APPROACH;  Surgeon: Paralee Cancel, MD;  Location: [facility] ORS;  Service: Orthopedics;  Laterality: Left;  Requests 70 mins    I have  reviewed the social history and family history with the patient and they are unchanged from previous note.  ALLERGIES:  is allergic to penicillins.  MEDICATIONS:  Current Outpatient Prescriptions  Medication Sig Dispense Refill  . doxycycline (VIBRAMYCIN) 100 MG capsule Take 1 capsule (100 mg total) by mouth 2 (two) times daily. 60 capsule 1  . dronabinol (MARINOL) 5 MG capsule Take 1 capsule (5 mg total) by mouth 2 (two) times daily before a meal. 60 capsule 1  . HYDROcodone-acetaminophen (NORCO) 7.5-325 MG tablet Take 1-2 tablets by  mouth every 4 (four) hours as needed for moderate pain or severe pain. 60 tablet 0  . LACTULOSE PO Take by mouth.    . methocarbamol (ROBAXIN) 500 MG tablet Take 1 tablet (500 mg total) by mouth every 6 (six) hours as needed for muscle spasms. 40 tablet 0  . metoCLOPramide (REGLAN) 10 MG tablet Take 1 tablet (10 mg total) by mouth 4 (four) times daily -  before meals and at bedtime. 120 tablet 0  . ondansetron (ZOFRAN-ODT) 4 MG disintegrating tablet Take 1 tablet (4 mg total) by mouth every 8 (eight) hours as needed for nausea or vomiting. 20 tablet 0  . polyethylene glycol (MIRALAX / GLYCOLAX) packet Take 17 g by mouth 2 (two) times daily. 14 each 0  . rivaroxaban (XARELTO) 20 MG TABS tablet Take 1 tablet (20 mg total) by mouth daily with supper. Please follow up with PCP to continue this medication. 30 tablet 0  . saccharomyces boulardii (FLORASTOR) 250 MG capsule Take 250 mg by mouth daily.    Marland Kitchen bismuth subsalicylate (PEPTO BISMOL) 262 MG chewable tablet Chew 524 mg by mouth daily as needed for indigestion or diarrhea or loose stools.    . docusate sodium (COLACE) 100 MG capsule Take 1 capsule (100 mg total) by mouth 2 (two) times daily. (Patient not taking: Reported on 04/30/2017) 10 capsule 0  . Efinaconazole (JUBLIA) 10 % SOLN Apply 1 application topically daily.    . ferrous sulfate (FERROUSUL) 325 (65 FE) MG tablet Take 1 tablet (325 mg total) by mouth 3 (three) times daily with meals. (Patient not taking: Reported on 05/16/2017)    . lidocaine-prilocaine (EMLA) cream Apply 1 application topically as needed. 30 g 6  . Methenamine-Sodium Salicylate (AZO URINARY TRACT DEFENSE PO) Take 2 tablets by mouth daily as needed (uti symptoms).    Marland Kitchen omeprazole (PRILOSEC) 20 MG capsule Take 20 mg by mouth daily.     No current facility-administered medications for this visit.     PHYSICAL EXAMINATION:  ECOG PERFORMANCE STATUS: 3 - Symptomatic, >50% confined to bed BP 139/71 (BP Location: Left Arm,  Patient Position: Sitting)   Pulse 83   Temp 98.8 F (37.1 C) (Oral)   Resp 19   Ht 5' (1.524 m)   SpO2 100%   GENERAL:alert, moderately distress due to nausea and abdominal discomfort  SKIN: skin color, texture, turgor are normal, no rashes or significant lesions EYES: normal, Conjunctiva are pink and non-injected, sclera clear OROPHARYNX:no exudate, no erythema and lips, buccal mucosa, and tongue normal  NECK: supple, thyroid normal size, non-tender, without nodularity LYMPH:  no palpable lymphadenopathy in the cervical, axillary or inguinal LUNGS: clear to auscultation and percussion with normal breathing effort HEART: regular rate & rhythm and no murmurs and no lower extremity edema ABDOMEN:abdomen soft, mild diffuse tenderness, normal bowel sounds Musculoskeletal:no cyanosis of digits and no clubbing  NEURO: alert & oriented x 3 with fluent speech, no focal motor/sensory  deficits  LABORATORY DATA:  I have reviewed the data as listed CBC Latest Ref Rng & Units 05/16/2017 04/30/2017 04/22/2017  WBC 3.9 - 10.3 10e3/uL 5.8 7.7 9.3  Hemoglobin 11.6 - 15.9 g/dL 11.5(L) 11.2(L) 11.2(L)  Hematocrit 34.8 - 46.6 % 35.2 34.0(L) 33.6(L)  Platelets 145 - 400 10e3/uL 452(H) 412(H) 411(H)     CMP Latest Ref Rng & Units 05/16/2017 04/30/2017 04/23/2017  Glucose 70 - 140 mg/dl 90 107 99  BUN 7.0 - 26.0 mg/dL 6.4(L) 6.0(L) <5(L)  Creatinine 0.6 - 1.1 mg/dL 0.6 0.7 0.46  Sodium 136 - 145 mEq/L 139 142 138  Potassium 3.5 - 5.1 mEq/L 3.9 3.7 3.7  Chloride 101 - 111 mmol/L - - 107  CO2 22 - 29 mEq/L 25 28 25   Calcium 8.4 - 10.4 mg/dL 9.3 9.3 8.9  Total Protein 6.4 - 8.3 g/dL 6.9 6.5 -  Total Bilirubin 0.20 - 1.20 mg/dL 0.29 0.37 -  Alkaline Phos 40 - 150 U/L 98 80 -  AST 5 - 34 U/L 10 11 -  ALT 0-55 U/L U/L <6 7 -   PATHOLOGY:   Pathology Results: 04/22/17 Diagnosis Lymph node, needle/core biopsy, left retroperitoneum periaortic - POORLY DIFFERENTIATED SQUAMOUS CELL CARCINOMA - SEE  COMMENT Microscopic Comment The needle core biopsy has a basaloid neoplasm with increased mitoses and necrosis. There is no residual lymph node parenchyma identified. The neoplasm is immunoreactive for cytokeratin 7 (patchy), cytokeratin 5/6, and p16 but negative for CDX2 and cytokeratin 20. Overall, the histology and immunophenotype support the diagnosis of a poorly differentiated squamous cell carcinoma.   RADIOGRAPHIC STUDIES: I have personally reviewed the radiological images as listed and agreed with the findings in the report. No results found.   PET Scan 05/13/2017 IMPRESSION: 1. Perirectal adenopathy and posterior perirectal hypermetabolic mass, associated with presumed metastatic adenopathy in knee left common iliac, retroperitoneal, thoracic paraesophageal, and left upper mediastinal regions. The left upper mediastinal mass extends into the left supraclavicular space. 2. Metastatic disease to the liver (best seen in segment 3) along with a hypermetabolic nodule compatible with metastatic disease in the superior segment of the lower lobe of the left lung.   CT AP W Contrast 04/17/17: IMPRESSION: 1. Left posterior perirectal lobulated mass concerning for rectal malignancy. A smaller nodular density superior to this mass likely represent a mildly enlarged metastatic node. MRI is recommended for further evaluation. 2. 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 A hypodense lesion to the left of the L5 posterior to the psoas muscle is also consistent with metastatic disease. 3. Multiple hepatic hypodense lesions, incompletely characterized, possibly metastatic. MRI is recommended for further evaluation. 4. No evidence of bowel obstruction or active inflammation. Normal appendix.  ASSESSMENT & PLAN:  [name] is a 61 y.o. female from [place], with past medical history of DVT on Xarelto, rectal squamous cell  carcinoma status post chemotherapy and radiation in 2016, stroke, asthma, who was admitted to hospital recently for nausea, vomiting and abdominal pain. She has had those symptoms since her recent hip surgery on 04/21/2017.   1. 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 -We reviewed her recent CT scan findings, and her retroperitoneal lymph node biopsy results in details with patient and her daughter. -Unfortunately, biopsy confirmed metastatic disease to abdominal lymph  nodes. Her CT scan is also suspicious for liver and pelvic metastasis. She was not able to tolerate MRI to evaluate her liver lesions. -We discussed the incurable nature of her disease at this stage, and the goal of therapy is palliative. -I discussed her staging PET scan results, which showed metastasis in liver, left lung and diffuse nodal metastasis in chest and abdomen.  -I recommend immunotherapy with Nivolumab as her first-line treatment due to her poor performance status. Chemotherapy consent was obtained previously. We again reviewed the potential side effects, she agrees to proceed. - Her port has not been used since 2016. We will draw blood to check function, then flush. She agreed to utilize it if it is working. She will use her arm today.  - ask radiology to look at port - Labs reviewed. Patient is adequate for first dose Nivolimab today (5/25) - She would like to talk to a dietitian to discuss what vegetables she can eat. I will set up for her.   2. Nausea, abdominal pain, secondary to constipation and metastatic cancer -Due to her sever nausea I have prescribed her Marinol 2.5mg  twice a day. -She'll continue Reglan -overall improved some latley   3. Headaches  -secondary to nausea    -when the headache is bad she sees bright light  -Will monitor is Marinol aids in the nausea and headaches  4. Mild anemia and thrombocytosis -Likely secondary to her underlying metastatic cancer -Her iron study today showed normal serum iron, decreased TIBC increased ferritin, this is consistent with anemia of chronic disease. -Continue monitoring.  5. Goal of care discussion  -We again discussed the incurable nature of her cancer, and the overall poor prognosis, especially if she does not have good response to chemotherapy or progress on chemo -The patient understands the goal of care is palliative. -I previously recommend DNR/DNI, she will think about it    PLAN:  - Labs reviewed. She is adequate for first dose Nivolimab today  -   I will ask radiologist to look at port position - Provided copy of PET scan report - set up appointment with dietitian Lab, flush, f/u (APP or me)  and nivolimab in 2 and 4 weeks  - Port flush next week    Orders Placed This Encounter  Procedures  . CBC with Differential    Standing Status:   Standing    Number of Occurrences:   50    Standing Expiration Date:   05/16/2022  . Comprehensive metabolic panel    Standing Status:   Standing    Number of Occurrences:   50    Standing Expiration Date:   05/16/2022  . Ferritin    Standing Status:   Future    Number of Occurrences:   1    Standing Expiration Date:   05/16/2018  . Iron and TIBC    Standing Status:   Future    Number of Occurrences:   1    Standing Expiration Date:   05/16/2018  . TSH    Standing Status:   Standing    Number of Occurrences:   50    Standing Expiration Date:   05/16/2022   All questions were answered. The patient knows to call the clinic with any problems, questions or concerns. No barriers to learning was detected.  I spent 25 minutes counseling the patient face to face. The total time spent in the appointment was 30 minutes and more than 50% was on counseling and  review of test results  This document serves as a record of services personally performed by Truitt Merle, MD. It was created on her behalf by Brandt Loosen, a trained medical scribe. The creation of this record is based on the scribe's personal observations and the provider's statements to them. This document has been checked and approved by the attending provider.     Truitt Merle, MD 05/16/2017

## 2017-05-16 ENCOUNTER — Ambulatory Visit (HOSPITAL_BASED_OUTPATIENT_CLINIC_OR_DEPARTMENT_OTHER): Payer: Federal, State, Local not specified - PPO | Admitting: Hematology

## 2017-05-16 ENCOUNTER — Telehealth: Payer: Self-pay | Admitting: Hematology

## 2017-05-16 ENCOUNTER — Other Ambulatory Visit (HOSPITAL_BASED_OUTPATIENT_CLINIC_OR_DEPARTMENT_OTHER): Payer: Federal, State, Local not specified - PPO

## 2017-05-16 ENCOUNTER — Ambulatory Visit (HOSPITAL_BASED_OUTPATIENT_CLINIC_OR_DEPARTMENT_OTHER): Payer: Federal, State, Local not specified - PPO

## 2017-05-16 ENCOUNTER — Encounter: Payer: Self-pay | Admitting: Hematology

## 2017-05-16 VITALS — BP 139/71 | HR 83 | Temp 98.8°F | Resp 19 | Ht 60.0 in

## 2017-05-16 DIAGNOSIS — G893 Neoplasm related pain (acute) (chronic): Secondary | ICD-10-CM

## 2017-05-16 DIAGNOSIS — D638 Anemia in other chronic diseases classified elsewhere: Secondary | ICD-10-CM | POA: Diagnosis not present

## 2017-05-16 DIAGNOSIS — C210 Malignant neoplasm of anus, unspecified: Secondary | ICD-10-CM

## 2017-05-16 DIAGNOSIS — C772 Secondary and unspecified malignant neoplasm of intra-abdominal lymph nodes: Secondary | ICD-10-CM | POA: Diagnosis not present

## 2017-05-16 DIAGNOSIS — Z86718 Personal history of other venous thrombosis and embolism: Secondary | ICD-10-CM

## 2017-05-16 DIAGNOSIS — C787 Secondary malignant neoplasm of liver and intrahepatic bile duct: Secondary | ICD-10-CM

## 2017-05-16 DIAGNOSIS — D630 Anemia in neoplastic disease: Secondary | ICD-10-CM | POA: Insufficient documentation

## 2017-05-16 DIAGNOSIS — D696 Thrombocytopenia, unspecified: Secondary | ICD-10-CM

## 2017-05-16 DIAGNOSIS — K5900 Constipation, unspecified: Secondary | ICD-10-CM

## 2017-05-16 DIAGNOSIS — I82409 Acute embolism and thrombosis of unspecified deep veins of unspecified lower extremity: Secondary | ICD-10-CM

## 2017-05-16 DIAGNOSIS — Z5112 Encounter for antineoplastic immunotherapy: Secondary | ICD-10-CM

## 2017-05-16 DIAGNOSIS — R112 Nausea with vomiting, unspecified: Secondary | ICD-10-CM

## 2017-05-16 DIAGNOSIS — Z7189 Other specified counseling: Secondary | ICD-10-CM

## 2017-05-16 DIAGNOSIS — R51 Headache: Secondary | ICD-10-CM

## 2017-05-16 LAB — CBC WITH DIFFERENTIAL/PLATELET
BASO%: 0.6 % (ref 0.0–2.0)
Basophils Absolute: 0 10*3/uL (ref 0.0–0.1)
EOS ABS: 0.1 10*3/uL (ref 0.0–0.5)
EOS%: 1.2 % (ref 0.0–7.0)
HEMATOCRIT: 35.2 % (ref 34.8–46.6)
HEMOGLOBIN: 11.5 g/dL — AB (ref 11.6–15.9)
LYMPH#: 1.1 10*3/uL (ref 0.9–3.3)
LYMPH%: 19.2 % (ref 14.0–49.7)
MCH: 27.8 pg (ref 25.1–34.0)
MCHC: 32.7 g/dL (ref 31.5–36.0)
MCV: 85 fL (ref 79.5–101.0)
MONO#: 0.6 10*3/uL (ref 0.1–0.9)
MONO%: 10.3 % (ref 0.0–14.0)
NEUT%: 68.7 % (ref 38.4–76.8)
NEUTROS ABS: 4 10*3/uL (ref 1.5–6.5)
PLATELETS: 452 10*3/uL — AB (ref 145–400)
RBC: 4.14 10*6/uL (ref 3.70–5.45)
RDW: 18.7 % — AB (ref 11.2–14.5)
WBC: 5.8 10*3/uL (ref 3.9–10.3)

## 2017-05-16 LAB — IRON AND TIBC
%SAT: 21 % (ref 21–57)
IRON: 43 ug/dL (ref 41–142)
TIBC: 203 ug/dL — AB (ref 236–444)
UIBC: 160 ug/dL (ref 120–384)

## 2017-05-16 LAB — COMPREHENSIVE METABOLIC PANEL
ALBUMIN: 3.5 g/dL (ref 3.5–5.0)
ALK PHOS: 98 U/L (ref 40–150)
ANION GAP: 7 meq/L (ref 3–11)
AST: 10 U/L (ref 5–34)
BILIRUBIN TOTAL: 0.29 mg/dL (ref 0.20–1.20)
BUN: 6.4 mg/dL — ABNORMAL LOW (ref 7.0–26.0)
CO2: 25 mEq/L (ref 22–29)
CREATININE: 0.6 mg/dL (ref 0.6–1.1)
Calcium: 9.3 mg/dL (ref 8.4–10.4)
Chloride: 107 mEq/L (ref 98–109)
EGFR: >90 mL/min/{1.73_m2} (ref >90)
Glucose: 90 mg/dl (ref 70–140)
Potassium: 3.9 mEq/L (ref 3.5–5.1)
Sodium: 139 mEq/L (ref 136–145)
TOTAL PROTEIN: 6.9 g/dL (ref 6.4–8.3)

## 2017-05-16 LAB — TSH: TSH: 1.404 m(IU)/L (ref 0.308–3.960)

## 2017-05-16 LAB — FERRITIN: Ferritin: 287 ng/ml — ABNORMAL HIGH (ref 9–269)

## 2017-05-16 MED ORDER — DRONABINOL 5 MG PO CAPS: 5.0000 mg | ORAL_CAPSULE | Freq: Two times a day (BID) | ORAL | 1 refills | Status: DC

## 2017-05-16 MED ORDER — SODIUM CHLORIDE 0.9 % IV SOLN
Freq: Once | INTRAVENOUS | Status: AC
  Administered 2017-05-16: 14:00:00 via INTRAVENOUS

## 2017-05-16 MED ORDER — SODIUM CHLORIDE 0.9 % IV SOLN
240.0000 mg | Freq: Once | INTRAVENOUS | Status: AC
  Administered 2017-05-16: 240 mg via INTRAVENOUS
  Filled 2017-05-16: qty 24

## 2017-05-16 MED ORDER — LIDOCAINE-PRILOCAINE 2.5-2.5 % EX CREA: 1.0000 "application " | TOPICAL_CREAM | CUTANEOUS | 6 refills | Status: AC | PRN

## 2017-05-16 MED FILL — LIDOCAINE-PRILOCAINE CREAM: 2.5-2.5 | 15 days supply | Qty: 30 | Fill #0

## 2017-05-16 NOTE — Telephone Encounter (Signed)
Gave patient AVS and calender per 5/25 LOS.  

## 2017-05-16 NOTE — Patient Instructions (Signed)
[facility] Discharge Instructions for Patients Receiving Chemotherapy  Today you received the following chemotherapy agents nivolumab (Opdivo).  To help prevent nausea and vomiting after your treatment, we encourage you to take your nausea medication as directed by your doctor.   If you develop nausea and vomiting that is not controlled by your nausea medication, call the clinic.   BELOW ARE SYMPTOMS THAT SHOULD BE REPORTED IMMEDIATELY:  *FEVER GREATER THAN 100.5 F  *CHILLS WITH OR WITHOUT FEVER  NAUSEA AND VOMITING THAT IS NOT CONTROLLED WITH YOUR NAUSEA MEDICATION  *UNUSUAL SHORTNESS OF BREATH  *UNUSUAL BRUISING OR BLEEDING  TENDERNESS IN MOUTH AND THROAT WITH OR WITHOUT PRESENCE OF ULCERS  *URINARY PROBLEMS  *BOWEL PROBLEMS  UNUSUAL RASH Items with * indicate a potential emergency and should be followed up as soon as possible.  Feel free to call the clinic you have any questions or concerns. The clinic phone number is [phone].  Please show the [facility] at check-in to the Emergency Department and triage nurse.  Nivolumab injection What is this medicine? NIVOLUMAB (nye VOL ue mab) is a monoclonal antibody. It is used to treat melanoma, lung cancer, kidney cancer, head and neck cancer, Hodgkin lymphoma, urothelial cancer, colon cancer, and liver cancer. This medicine may be used for other purposes; ask your health care provider or pharmacist if you have questions. COMMON BRAND NAME(S): Opdivo What should I tell my health care provider before I take this medicine? They need to know if you have any of these conditions: -diabetes -immune system problems -kidney disease -liver disease -lung disease -organ transplant -stomach or intestine problems -thyroid disease -an unusual or allergic reaction to nivolumab, other medicines, foods, dyes, or preservatives -pregnant or trying to get pregnant -breast-feeding How should I use this  medicine? This medicine is for infusion into a vein. It is given by a health care professional in a hospital or clinic setting. A special MedGuide will be given to you before each treatment. Be sure to read this information carefully each time. Talk to your pediatrician regarding the use of this medicine in children. While this drug may be prescribed for children as young as 12 years for selected conditions, precautions do apply. Overdosage: If you think you have taken too much of this medicine contact a poison control center or emergency room at once. NOTE: This medicine is only for you. Do not share this medicine with others. What if I miss a dose? It is important not to miss your dose. Call your doctor or health care professional if you are unable to keep an appointment. What may interact with this medicine? Interactions have not been studied. Give your health care provider a list of all the medicines, herbs, non-prescription drugs, or dietary supplements you use. Also tell them if you smoke, drink alcohol, or use illegal drugs. Some items may interact with your medicine. This list may not describe all possible interactions. Give your health care provider a list of all the medicines, herbs, non-prescription drugs, or dietary supplements you use. Also tell them if you smoke, drink alcohol, or use illegal drugs. Some items may interact with your medicine. What should I watch for while using this medicine? This drug may make you feel generally unwell. Continue your course of treatment even though you feel ill unless your doctor tells you to stop. You may need blood work done while you are taking this medicine. Do not become pregnant while taking this medicine or for 5  months after stopping it. Women should inform their doctor if they wish to become pregnant or think they might be pregnant. There is a potential for serious side effects to an unborn child. Talk to your health care professional or  pharmacist for more information. Do not breast-feed an infant while taking this medicine. What side effects may I notice from receiving this medicine? Side effects that you should report to your doctor or health care professional as soon as possible: -allergic reactions like skin rash, itching or hives, swelling of the face, lips, or tongue -black, tarry stools -blood in the urine -bloody or watery diarrhea -changes in vision -change in sex drive -changes in emotions or moods -chest pain -confusion -cough -decreased appetite -diarrhea -facial flushing -feeling faint or lightheaded -fever, chills -hair loss -hallucination, loss of contact with reality -headache -irritable -joint pain -loss of memory -muscle pain -muscle weakness -seizures -shortness of breath -signs and symptoms of high blood sugar such as dizziness; dry mouth; dry skin; fruity breath; nausea; stomach pain; increased hunger or thirst; increased urination -signs and symptoms of kidney injury like trouble passing urine or change in the amount of urine -signs and symptoms of liver injury like dark yellow or brown urine; general ill feeling or flu-like symptoms; light-colored stools; loss of appetite; nausea; right upper belly pain; unusually weak or tired; yellowing of the eyes or skin -stiff neck -swelling of the ankles, feet, hands -weight gain Side effects that usually do not require medical attention (report to your doctor or health care professional if they continue or are bothersome): -bone pain -constipation -tiredness -vomiting This list may not describe all possible side effects. Call your doctor for medical advice about side effects. You may report side effects to FDA at 1-800-FDA-1088. Where should I keep my medicine? This drug is given in a hospital or clinic and will not be stored at home. NOTE: This sheet is a summary. It may not cover all possible information. If you have questions about this  medicine, talk to your doctor, pharmacist, or health care provider.  2018 Elsevier/Gold Standard (2016-09-16 17:49:34)

## 2017-05-16 NOTE — Progress Notes (Signed)
Faxed completed BMS app to be processed.  Will notify pt of the outcome when I receive it.

## 2017-05-17 ENCOUNTER — Encounter: Payer: Self-pay | Admitting: Hematology

## 2017-05-20 ENCOUNTER — Encounter: Payer: Self-pay | Admitting: Hematology

## 2017-05-20 MED FILL — DRONABINOL 5 MG CAPSULE: 5 | 30 days supply | Qty: 60 | Fill #0

## 2017-05-20 NOTE — Progress Notes (Signed)
Pt has been approved w/ [employer] for Opdivo from 05/20/17 to 12/22/17 for $25,000.  Emailed copy of approval letter to Ledora Bottcher and [place] in billing as well as to HIM to be scanned in pt's chart.

## 2017-05-21 ENCOUNTER — Telehealth: Payer: Self-pay | Admitting: *Deleted

## 2017-05-21 ENCOUNTER — Other Ambulatory Visit: Payer: Self-pay | Admitting: *Deleted

## 2017-05-21 MED ORDER — METOCLOPRAMIDE HCL 10 MG PO TABS: 10.0000 mg | ORAL_TABLET | Freq: Three times a day (TID) | ORAL | 0 refills | Status: DC

## 2017-05-21 NOTE — Telephone Encounter (Signed)
-----   Message from Johann Capers, RN sent at 05/16/2017  4:42 PM EDT ----- Regarding: Dr. Burr Medico - f/u phone call Dr. Burr Medico - f/u phone call first-time opdivo pt

## 2017-05-21 NOTE — Telephone Encounter (Signed)
Let message for pt to return call to discuss how she did post 1st Opdivo,.

## 2017-05-21 NOTE — Telephone Encounter (Signed)
Received call back from daughter, [name] regarding chemo call back.  She states pt was nauseated yest & vomited but she was late taking her reglan & not sure if it was from the chemo or regarding the reglan.  She feels better regarding nausea today but is tired. She reports running low on reglan & would like a refill.  She states the marinol helps & she feels good 1st half of day & reglan helps the 2nd half of day.  She reports the marinol increases her appetite mostly with increased dose.  She also reports bone pain in legs, knees, ankles & coccyx.  She has pain med & will check with ortho for refills.  Her GP renewed zofran ODT Instructed to be careful that she doesn't get breakdown of coccyx & to change position freq.  Message to Dr Burr Medico for [place].

## 2017-05-22 ENCOUNTER — Telehealth: Payer: Self-pay

## 2017-05-22 MED ORDER — METOCLOPRAMIDE HCL 10 MG PO TABS: 10.0000 mg | ORAL_TABLET | Freq: Three times a day (TID) | ORAL | 0 refills | Status: DC

## 2017-05-22 MED FILL — METOCLOPRAMIDE 10 MG TABLET: 10 | 30 days supply | Qty: 120 | Fill #0

## 2017-05-22 NOTE — Addendum Note (Signed)
Addended by: Cherylynn Ridges on: 05/22/2017 09:54 AM   Modules accepted: Orders

## 2017-05-22 NOTE — Telephone Encounter (Signed)
[name] called at [phone] that [place] not at [place]. Called back and LVM that Rx was e-scribed at [phone], please check with pharmacy again.

## 2017-05-23 ENCOUNTER — Ambulatory Visit (HOSPITAL_BASED_OUTPATIENT_CLINIC_OR_DEPARTMENT_OTHER): Payer: Federal, State, Local not specified - PPO

## 2017-05-23 DIAGNOSIS — Z95828 Presence of other vascular implants and grafts: Secondary | ICD-10-CM | POA: Insufficient documentation

## 2017-05-23 DIAGNOSIS — C801 Malignant (primary) neoplasm, unspecified: Secondary | ICD-10-CM

## 2017-05-23 DIAGNOSIS — C787 Secondary malignant neoplasm of liver and intrahepatic bile duct: Secondary | ICD-10-CM | POA: Diagnosis not present

## 2017-05-23 DIAGNOSIS — Z452 Encounter for adjustment and management of vascular access device: Secondary | ICD-10-CM | POA: Diagnosis not present

## 2017-05-23 DIAGNOSIS — C772 Secondary and unspecified malignant neoplasm of intra-abdominal lymph nodes: Secondary | ICD-10-CM | POA: Diagnosis not present

## 2017-05-23 DIAGNOSIS — C210 Malignant neoplasm of anus, unspecified: Secondary | ICD-10-CM | POA: Diagnosis not present

## 2017-05-23 MED ORDER — SODIUM CHLORIDE 0.9% FLUSH
10.0000 mL | Freq: Once | INTRAVENOUS | Status: AC
  Administered 2017-05-23: 10 mL
  Filled 2017-05-23: qty 10

## 2017-05-23 MED ORDER — HEPARIN SOD (PORK) LOCK FLUSH 100 UNIT/ML IV SOLN
500.0000 [IU] | Freq: Once | INTRAVENOUS | Status: AC
  Administered 2017-05-23: 500 [IU]
  Filled 2017-05-23: qty 5

## 2017-05-24 NOTE — Addendum Note (Signed)
Addendum  created 05/24/17 0813 by Duane Boston, MD   Sign clinical note

## 2017-05-29 NOTE — Progress Notes (Signed)
[facility]  [phone] [phone]  Clinic Follow up Note   Patient Care Team: Damaris Hippo, MD as PCP - General (Family Medicine) 05/30/2017  SUMMARY OF ONCOLOGIC HISTORY: Oncology History   [id number] stage IIIB      Anal cancer ([facility])   03/09/2015 Initial Diagnosis    Rectal cancer ([facility])      04/19/2015 Imaging    PET scan showed hypermetabolic rectal lesion and inguinal lymph nodes      05/08/2015 - 07/13/2015 Radiation Therapy    59 Gy in 35 fractions to pelvis and inguinal LNs for her anal cancer. She tolerated treatment poorly with persistent nausea, vomiting, diarrhea and a poor appetite throughout treatment, and required several hospitalization and treatment delays. She did complete the planned treatment.       05/08/2015 - 06/2015 Chemotherapy    Concurrent chemotherapy with 5-FU and mitomycin along with radiation. Pt tolerated poorly.       01/08/2016 Imaging    PET scan showed you hypermetabolic left mediastinal 1L adenopathy (1.5cm), partial response to therapy with interval decrease in extent of hypermetabolic rectal soft tissue fullness as well as resolution of inguinal adenopathy.      02/06/2017 Procedure    Colonoscopy showed scar tissue just off anal verge, no lesion otherwise.      04/16/2017 - 04/23/2017 Hospital Admission    Admitted 04/16/17 - 04/23/17 for Nausea and Vomiting and abdominal pain. CT abd noted to have findings including rectal mass and enlarged nodes. Patient underwent sigmoidoscopy with biopsy showing benign findings. IR was consulted for node biopsy and which was done and awaiting results.      04/17/2017 Imaging    CT AP W Contrast 04/17/17: IMPRESSION: 1. Left posterior perirectal lobulated mass concerning for rectal malignancy. A smaller nodular density superior to this mass likely represent a mildly enlarged metastatic node. MRI is recommended for further evaluation. 2. Retroperitoneal adenopathy most  consistent with metastatic disease, likely from rectal neoplasm. A hypodense lesion to the left of the L5 posterior to the psoas muscle is also consistent with metastatic disease. 3. Multiple hepatic hypodense lesions, incompletely characterized, possibly metastatic. MRI is recommended for further evaluation. 4. No evidence of bowel obstruction or active inflammation. Normal appendix.      04/17/2017 Pathology Results    Diagnosis Rectum, biopsy, distal - BENIGN COLONIC MUCOSA WITH EROSIONS AND ULCERATION, SEE COMMENT. - NO DYSPLASIA OR MALIGNANCY.       04/22/2017 Pathology Results    Diagnosis Lymph node, needle/core biopsy, left retroperitoneum periaortic - POORLY DIFFERENTIATED SQUAMOUS CELL CARCINOMA - SEE COMMENT Microscopic Comment The needle core biopsy has a basaloid neoplasm with increased mitoses and necrosis. There is no residual lymph node parenchyma identified. The neoplasm is immunoreactive for cytokeratin 7 (patchy), cytokeratin 5/6, and p16 but negative for CDX2 and cytokeratin 20. Overall, the histology and immunophenotype support the diagnosis of a poorly differentiated squamous cell carcinoma.       05/13/2017 PET scan    IMPRESSION: 1. Perirectal adenopathy and posterior perirectal hypermetabolic mass, associated with presumed metastatic adenopathy in knee left common iliac, retroperitoneal, thoracic paraesophageal, and left upper mediastinal regions. The left upper mediastinal mass extends into the left supraclavicular space. 2. Metastatic disease to the liver (best seen in segment 3) along with a hypermetabolic nodule compatible with metastatic disease in the superior segment of the lower lobe of the left lung.      History of Present Illness (04/17/17) [name] is a 61  y.o. 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 I was called to see her in hospital.   She is from [place], with  past medical history of DVT on Xarelto, rectal cancer status post chemotherapy and radiation in 2016, stroke, asthma, who was admitted to hospital last night for nausea, vomiting and abdominal pain. CT of abdomen and pelvis showed left posterior perirectal lobulated mass concerning for rectum malignancy. Retroperitoneal adenopathy, I hypodense lesion to the left L5 to the sore S muscle, suspicious for metastasis, and multiple hepatic hypodense lesions, indeterminate. I was called to see the patient to ruled out metastatic rectal cancer recurrence. I saw the patient before her endoscopy today.  The patient reports she has a prior history of rectal cancer stage 3, squamous cell, which was treated with chemotherapy and radiation therapy. She did not have surgery. The patient reports she completed treatment in September 2016. The patient has not used her port since 2016, though it is still in place. She reports the last time she followed up with physicians for her prior cancer, they told her she had suspicious masses in her lungs and liver.  The patient does not have any medical records from [place], where she previously lived and was treated, but reports her daughter would have them. She reports she does not know if she plans to stay in [place], or if she will return to [place].    Current Treatment:  Nivolumab 240mg  every 2 weeks started on 05/16/17   INTERVAL HISTORY: [name] is here for a follow up accompanied by her daughter, here for third cycle Nivolumab. After her previous treatment, she experienced nausea. She was fine after the 3-4 day. She just experiences fatigue.   Her daughter reports she is walking and moving around a lot more lately.   REVIEW OF SYSTEMS:   Constitutional: Denies fevers, chills or abnormal weight loss  (+)fatigue  Eyes: Denies blurriness of vision Ears, nose, mouth, throat, and face: Denies mucositis or sore throat Respiratory: Denies cough,  dyspnea or wheezes Cardiovascular: Denies palpitation, chest discomfort or lower extremity swelling Gastrointestinal:  Denies heartburn  (+) nausea  Skin: Denies abnormal skin rashes Lymphatics: Denies new lymphadenopathy or easy bruising Neurological:Denies numbness, tingling or new weaknesses Behavioral/Psych: Mood is stable, no new changes  All other systems were reviewed with the patient and are negative.  MEDICAL HISTORY:  Past Medical History:  Diagnosis Date  . Anxiety   . Arthritis   . Asthma    childhood   . Cancer [facility])    colorectal cancer with chemo and radiation   . DVT (deep venous thrombosis) ([facility])   . Dyspnea    with anxiety and exertion  . GERD (gastroesophageal reflux disease)   . Neuromuscular disorder (H[facility])    poly neuropathy   . Stroke [facility]) 2010, 2011   TIA  x2    SURGICAL HISTORY: Past Surgical History:  Procedure Laterality Date  . CESAREAN SECTION    . FLEXIBLE SIGMOIDOSCOPY N/A 04/17/2017   Procedure: FLEXIBLE SIGMOIDOSCOPY;  Surgeon: Ronald Lobo, MD;  Location: [facility] ENDOSCOPY;  Service: Endoscopy;  Laterality: N/A;  . THYROID CYST EXCISION    . TOTAL HIP ARTHROPLASTY Left 03/25/2017   Procedure: LEFT TOTAL HIP ARTHROPLASTY ANTERIOR APPROACH;  Surgeon: Paralee Cancel, MD;  Location: [facility] ORS;  Service: Orthopedics;  Laterality: Left;  Requests 70 mins    I have reviewed the social history and family history with the  patient and they are unchanged from previous note.  ALLERGIES:  is allergic to penicillins.  MEDICATIONS:  Current Outpatient Prescriptions  Medication Sig Dispense Refill  . bismuth subsalicylate (PEPTO BISMOL) 262 MG chewable tablet Chew 524 mg by mouth daily as needed for indigestion or diarrhea or loose stools.    . docusate sodium (COLACE) 100 MG capsule Take 1 capsule (100 mg total) by mouth 2 (two) times daily. (Patient not taking: Reported on 04/30/2017) 10 capsule 0  . doxycycline (VIBRAMYCIN) 100 MG capsule Take 1 capsule (100 mg  total) by mouth 2 (two) times daily. 60 capsule 1  . dronabinol (MARINOL) 5 MG capsule Take 1 capsule (5 mg total) by mouth 2 (two) times daily before a meal. 60 capsule 1  . Efinaconazole (JUBLIA) 10 % SOLN Apply 1 application topically daily.    . ferrous sulfate (FERROUSUL) 325 (65 FE) MG tablet Take 1 tablet (325 mg total) by mouth 3 (three) times daily with meals. (Patient not taking: Reported on 05/16/2017)    . HYDROcodone-acetaminophen (NORCO) 7.5-325 MG tablet Take 1-2 tablets by mouth every 4 (four) hours as needed for moderate pain or severe pain. 60 tablet 0  . LACTULOSE PO Take by mouth.    . lidocaine-prilocaine (EMLA) cream Apply 1 application topically as needed. 30 g 6  . Methenamine-Sodium Salicylate (AZO URINARY TRACT DEFENSE PO) Take 2 tablets by mouth daily as needed (uti symptoms).    . methocarbamol (ROBAXIN) 500 MG tablet Take 1 tablet (500 mg total) by mouth every 6 (six) hours as needed for muscle spasms. 40 tablet 0  . metoCLOPramide (REGLAN) 10 MG tablet Take 1 tablet (10 mg total) by mouth 4 (four) times daily -  before meals and at bedtime. 120 tablet 0  . omeprazole (PRILOSEC) 20 MG capsule Take 20 mg by mouth daily.    . ondansetron (ZOFRAN ODT) 8 MG disintegrating tablet Take 1 tablet (8 mg total) by mouth every 8 (eight) hours as needed for nausea or vomiting. 20 tablet 0  . ondansetron (ZOFRAN-ODT) 4 MG disintegrating tablet Take 1 tablet (4 mg total) by mouth every 8 (eight) hours as needed for nausea or vomiting. 20 tablet 0  . polyethylene glycol (MIRALAX / GLYCOLAX) packet Take 17 g by mouth 2 (two) times daily. 14 each 0  . rivaroxaban (XARELTO) 20 MG TABS tablet Take 1 tablet (20 mg total) by mouth daily with supper. Please follow up with PCP to continue this medication. 30 tablet 0  . saccharomyces boulardii (FLORASTOR) 250 MG capsule Take 250 mg by mouth daily.     No current facility-administered medications for this visit.     PHYSICAL EXAMINATION:    ECOG PERFORMANCE STATUS: 3 - Symptomatic, >50% confined to bed BP (!) 154/80 (BP Location: Right Wrist, Patient Position: Sitting)   Pulse 86   Temp 98.8 F (37.1 C) (Oral)   Resp 18   Ht 5' (1.524 m)   Wt 143 lb (64.9 kg)   SpO2 100%   BMI 27.93 kg/m   GENERAL:alert, moderately distress due to nausea and abdominal discomfort  SKIN: skin color, texture, turgor are normal, no rashes or significant lesions EYES: normal, Conjunctiva are pink and non-injected, sclera clear OROPHARYNX:no exudate, no erythema and lips, buccal mucosa, and tongue normal  NECK: supple, thyroid normal size, non-tender, without nodularity LYMPH:  no palpable lymphadenopathy in the cervical, axillary or inguinal LUNGS: clear to auscultation and percussion with normal breathing effort HEART: regular rate & rhythm and  no murmurs and no lower extremity edema ABDOMEN:abdomen soft, mild diffuse tenderness, normal bowel sounds Musculoskeletal:no cyanosis of digits and no clubbing  NEURO: alert & oriented x 3 with fluent speech, no focal motor/sensory deficits  LABORATORY DATA:  I have reviewed the data as listed CBC Latest Ref Rng & Units 05/30/2017 05/16/2017 04/30/2017  WBC 3.9 - 10.3 10e3/uL 6.2 5.8 7.7  Hemoglobin 11.6 - 15.9 g/dL 11.8 11.5(L) 11.2(L)  Hematocrit 34.8 - 46.6 % 36.3 35.2 34.0(L)  Platelets 145 - 400 10e3/uL 410(H) 452(H) 412(H)     CMP Latest Ref Rng & Units 05/30/2017 05/16/2017 04/30/2017  Glucose 70 - 140 mg/dl 99 90 107  BUN 7.0 - 26.0 mg/dL 7.1 6.4(L) 6.0(L)  Creatinine 0.6 - 1.1 mg/dL 0.6 0.6 0.7  Sodium 136 - 145 mEq/L 142 139 142  Potassium 3.5 - 5.1 mEq/L 4.0 3.9 3.7  Chloride 101 - 111 mmol/L - - -  CO2 22 - 29 mEq/L 27 25 28   Calcium 8.4 - 10.4 mg/dL 9.2 9.3 9.3  Total Protein 6.4 - 8.3 g/dL 6.9 6.9 6.5  Total Bilirubin 0.20 - 1.20 mg/dL 0.23 0.29 0.37  Alkaline Phos 40 - 150 U/L 98 98 80  AST 5 - 34 U/L 12 10 11   ALT 0-55 U/L U/L <6 <6 7   PATHOLOGY:   Pathology Results:  04/22/17 Diagnosis Lymph node, needle/core biopsy, left retroperitoneum periaortic - POORLY DIFFERENTIATED SQUAMOUS CELL CARCINOMA - SEE COMMENT Microscopic Comment The needle core biopsy has a basaloid neoplasm with increased mitoses and necrosis. There is no residual lymph node parenchyma identified. The neoplasm is immunoreactive for cytokeratin 7 (patchy), cytokeratin 5/6, and p16 but negative for CDX2 and cytokeratin 20. Overall, the histology and immunophenotype support the diagnosis of a poorly differentiated squamous cell carcinoma.  Surgical Pathology 04/17/2017 Diagnosis Rectum, biopsy, distal - BENIGN COLONIC MUCOSA WITH EROSIONS AND ULCERATION, SEE COMMENT. - NO DYSPLASIA OR MALIGNANCY. Microscopic Comment There is lamina propria fibrosis and macrophages consistent with prior treatment. The case was called to Dr. Cristina Gong on 04/18/2017.  RADIOGRAPHIC STUDIES: I have personally reviewed the radiological images as listed and agreed with the findings in the report. No results found.   PET Scan 05/13/2017 IMPRESSION: 1. Perirectal adenopathy and posterior perirectal hypermetabolic mass, associated with presumed metastatic adenopathy in knee left common iliac, retroperitoneal, thoracic paraesophageal, and left upper mediastinal regions. The left upper mediastinal mass extends into the left supraclavicular space. 2. Metastatic disease to the liver (best seen in segment 3) along with a hypermetabolic nodule compatible with metastatic disease in the superior segment of the lower lobe of the left lung.   CT AP W Contrast 04/17/17: IMPRESSION: 1. Left posterior perirectal lobulated mass concerning for rectal malignancy. A smaller nodular density superior to this mass likely represent a mildly enlarged metastatic node. MRI is recommended for further evaluation. 2. 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 A hypodense lesion  to the left of the L5 posterior to the psoas muscle is also consistent with metastatic disease. 3. Multiple hepatic hypodense lesions, incompletely characterized, possibly metastatic. MRI is recommended for further evaluation. 4. No evidence of bowel obstruction or active inflammation. Normal appendix.  ASSESSMENT & PLAN:  [name] is a 61 y.o. female from [place], with past medical history of DVT on Xarelto, rectal squamous cell carcinoma status post chemotherapy and radiation in 2016, stroke, asthma, who was admitted to hospital recently for nausea, vomiting and abdominal pain. She has had those  symptoms since her recent hip surgery on 04/21/2017.   1. 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 -We reviewed her recent CT scan findings, and her retroperitoneal lymph node biopsy results in details with patient and her daughter. -Unfortunately, biopsy confirmed metastatic disease to abdominal lymph nodes. Her CT scan is also suspicious for liver and pelvic metastasis. She was not able to tolerate MRI to evaluate her liver lesions. -We discussed the incurable nature of her disease at this stage, and the goal of therapy is palliative. -I discussed her staging PET scan results, which showed metastasis in liver, left lung and diffuse nodal metastasis in chest and abdomen.  -She has started first-line therapy with Nivolumab, tolerating well. - Labs reviewed. Patient is adequate for second dose Nivolimab today (6/8) - repeat scan after 4-6 treatments - I will again request her to see Pamala Hurry, the [place], f/u and lab in 2 and 4 weeks - I will prescribe dissolvable Zofran #20 pills   2. Nausea, abdominal pain, secondary to constipation and  metastatic cancer -Due to her sever nausea I have prescribed her Marinol 2.5mg  twice a day. -She'll continue Reglan -overall improved some latley   3. Headaches  -secondary to nausea  -when the headache is bad she sees bright light  -Will monitor is Marinol aids in the nausea and headaches  4. Mild anemia and thrombocytosis -Likely secondary to her underlying metastatic cancer -Her previous iron study showed normal serum iron, decreased TIBC increased ferritin, this is consistent with anemia of chronic disease. -Continue monitoring.  5. Goal of care discussion  -We again discussed the incurable nature of her cancer, and the overall poor prognosis, especially if she does not have good response to chemotherapy or progress on chemo -The patient understands the goal of care is palliative. -I previously recommend DNR/DNI, she will think about it   6. Port issue --I asked radiology to look at port from her recent PET scan -  From scan, we noticed the tip of the port is too high, she understands we may have to revise her port. We will use her arm for treatment today -will ask IR to revise her port   PLAN:  - - Labs reviewed. She is adequate for 2nd dose Nivolimab today and continue every 2 weeks  - I will prescribe dissolvable Zofran pills Port revision by IR within 2 weeks  Lab, flush, f/u with me and nivolumab in 4 and 6 weeks    No orders of the defined types were placed in this encounter.  All questions were answered. The patient knows to call the clinic with any problems, questions or concerns. No barriers to learning was detected.  I spent 25 minutes counseling the patient face to face. The total time spent in the appointment was 30 minutes and more than 50% was on counseling and review of test results  This document serves as a record of services personally performed by Truitt Merle, MD. It was created on her behalf by Brandt Loosen, a trained medical scribe. The creation of this  record is based on the scribe's personal observations and the provider's statements to them. This document has been checked and approved by the attending provider.     Truitt Merle, MD 05/30/2017

## 2017-05-30 ENCOUNTER — Ambulatory Visit (HOSPITAL_BASED_OUTPATIENT_CLINIC_OR_DEPARTMENT_OTHER): Payer: Federal, State, Local not specified - PPO | Admitting: Hematology

## 2017-05-30 ENCOUNTER — Ambulatory Visit: Payer: Federal, State, Local not specified - PPO

## 2017-05-30 ENCOUNTER — Encounter: Payer: Self-pay | Admitting: Hematology

## 2017-05-30 ENCOUNTER — Ambulatory Visit (HOSPITAL_BASED_OUTPATIENT_CLINIC_OR_DEPARTMENT_OTHER): Payer: Federal, State, Local not specified - PPO

## 2017-05-30 ENCOUNTER — Telehealth: Payer: Self-pay

## 2017-05-30 ENCOUNTER — Other Ambulatory Visit (HOSPITAL_BASED_OUTPATIENT_CLINIC_OR_DEPARTMENT_OTHER): Payer: Federal, State, Local not specified - PPO

## 2017-05-30 VITALS — BP 154/80 | HR 86 | Temp 98.8°F | Resp 18 | Ht 60.0 in | Wt 143.0 lb

## 2017-05-30 DIAGNOSIS — D649 Anemia, unspecified: Secondary | ICD-10-CM

## 2017-05-30 DIAGNOSIS — R112 Nausea with vomiting, unspecified: Secondary | ICD-10-CM

## 2017-05-30 DIAGNOSIS — C772 Secondary and unspecified malignant neoplasm of intra-abdominal lymph nodes: Secondary | ICD-10-CM

## 2017-05-30 DIAGNOSIS — R51 Headache: Secondary | ICD-10-CM

## 2017-05-30 DIAGNOSIS — I82409 Acute embolism and thrombosis of unspecified deep veins of unspecified lower extremity: Secondary | ICD-10-CM

## 2017-05-30 DIAGNOSIS — C210 Malignant neoplasm of anus, unspecified: Secondary | ICD-10-CM

## 2017-05-30 DIAGNOSIS — C787 Secondary malignant neoplasm of liver and intrahepatic bile duct: Secondary | ICD-10-CM

## 2017-05-30 DIAGNOSIS — D473 Essential (hemorrhagic) thrombocythemia: Secondary | ICD-10-CM

## 2017-05-30 DIAGNOSIS — Z5112 Encounter for antineoplastic immunotherapy: Secondary | ICD-10-CM

## 2017-05-30 DIAGNOSIS — G893 Neoplasm related pain (acute) (chronic): Secondary | ICD-10-CM

## 2017-05-30 DIAGNOSIS — R110 Nausea: Secondary | ICD-10-CM

## 2017-05-30 DIAGNOSIS — K5900 Constipation, unspecified: Secondary | ICD-10-CM

## 2017-05-30 DIAGNOSIS — Z7189 Other specified counseling: Secondary | ICD-10-CM

## 2017-05-30 DIAGNOSIS — R5383 Other fatigue: Secondary | ICD-10-CM | POA: Diagnosis not present

## 2017-05-30 LAB — CBC WITH DIFFERENTIAL/PLATELET
BASO%: 0.7 % (ref 0.0–2.0)
BASOS ABS: 0 10*3/uL (ref 0.0–0.1)
EOS ABS: 0.1 10*3/uL (ref 0.0–0.5)
EOS%: 2 % (ref 0.0–7.0)
HEMATOCRIT: 36.3 % (ref 34.8–46.6)
HGB: 11.8 g/dL (ref 11.6–15.9)
LYMPH%: 17.1 % (ref 14.0–49.7)
MCH: 27.4 pg (ref 25.1–34.0)
MCHC: 32.6 g/dL (ref 31.5–36.0)
MCV: 84.2 fL (ref 79.5–101.0)
MONO#: 0.8 10*3/uL (ref 0.1–0.9)
MONO%: 12.5 % (ref 0.0–14.0)
NEUT#: 4.2 10*3/uL (ref 1.5–6.5)
NEUT%: 67.7 % (ref 38.4–76.8)
PLATELETS: 410 10*3/uL — AB (ref 145–400)
RBC: 4.31 10*6/uL (ref 3.70–5.45)
RDW: 17.5 % — ABNORMAL HIGH (ref 11.2–14.5)
WBC: 6.2 10*3/uL (ref 3.9–10.3)
lymph#: 1.1 10*3/uL (ref 0.9–3.3)

## 2017-05-30 LAB — COMPREHENSIVE METABOLIC PANEL
ANION GAP: 8 meq/L (ref 3–11)
AST: 12 U/L (ref 5–34)
Albumin: 3.4 g/dL — ABNORMAL LOW (ref 3.5–5.0)
Alkaline Phosphatase: 98 U/L (ref 40–150)
BILIRUBIN TOTAL: 0.23 mg/dL (ref 0.20–1.20)
BUN: 7.1 mg/dL (ref 7.0–26.0)
CALCIUM: 9.2 mg/dL (ref 8.4–10.4)
CHLORIDE: 107 meq/L (ref 98–109)
CO2: 27 mEq/L (ref 22–29)
CREATININE: 0.6 mg/dL (ref 0.6–1.1)
EGFR: >90 mL/min/{1.73_m2} (ref >90)
Glucose: 99 mg/dl (ref 70–140)
Potassium: 4 mEq/L (ref 3.5–5.1)
Sodium: 142 mEq/L (ref 136–145)
Total Protein: 6.9 g/dL (ref 6.4–8.3)

## 2017-05-30 MED ORDER — SODIUM CHLORIDE 0.9 % IV SOLN
240.0000 mg | Freq: Once | INTRAVENOUS | Status: AC
  Administered 2017-05-30: 240 mg via INTRAVENOUS
  Filled 2017-05-30: qty 24

## 2017-05-30 MED ORDER — ONDANSETRON 8 MG PO TBDP: 8.0000 mg | ORAL_TABLET | Freq: Three times a day (TID) | ORAL | 0 refills | Status: DC | PRN

## 2017-05-30 MED ORDER — SODIUM CHLORIDE 0.9 % IV SOLN
Freq: Once | INTRAVENOUS | Status: AC
  Administered 2017-05-30: 13:00:00 via INTRAVENOUS

## 2017-05-30 MED FILL — ONDANSETRON ODT 8 MG TABLET: 8 | 7 days supply | Qty: 20 | Fill #0

## 2017-05-30 NOTE — Telephone Encounter (Signed)
Called and left a message with her dietician appt per inbox message  Ryann Pauli

## 2017-05-30 NOTE — Progress Notes (Signed)
Per Delle Reining, RN per Dr. Burr Medico okay to treat patient with labs from 05/16/17.

## 2017-05-30 NOTE — Progress Notes (Signed)
No available verification on port placement.  Patient sent back to lab for blood draw.

## 2017-05-30 NOTE — Patient Instructions (Signed)
[facility] Discharge Instructions for Patients Receiving Chemotherapy  Today you received the following chemotherapy agents:  Nivolumab.  To help prevent nausea and vomiting after your treatment, we encourage you to take your nausea medication as directed.   If you develop nausea and vomiting that is not controlled by your nausea medication, call the clinic.   BELOW ARE SYMPTOMS THAT SHOULD BE REPORTED IMMEDIATELY:  *FEVER GREATER THAN 100.5 F  *CHILLS WITH OR WITHOUT FEVER  NAUSEA AND VOMITING THAT IS NOT CONTROLLED WITH YOUR NAUSEA MEDICATION  *UNUSUAL SHORTNESS OF BREATH  *UNUSUAL BRUISING OR BLEEDING  TENDERNESS IN MOUTH AND THROAT WITH OR WITHOUT PRESENCE OF ULCERS  *URINARY PROBLEMS  *BOWEL PROBLEMS  UNUSUAL RASH Items with * indicate a potential emergency and should be followed up as soon as possible.  Feel free to call the clinic you have any questions or concerns. The clinic phone number is [phone].  Please show the CHEMO ALERT CARD at check-in to the Emergency Department and triage nurse.   

## 2017-06-01 ENCOUNTER — Encounter: Payer: Self-pay | Admitting: Hematology

## 2017-06-02 ENCOUNTER — Telehealth: Payer: Self-pay | Admitting: Hematology

## 2017-06-02 ENCOUNTER — Other Ambulatory Visit: Payer: Self-pay | Admitting: Radiology

## 2017-06-02 NOTE — Telephone Encounter (Signed)
Patient bypassed scheduling on 05/30/17. Appointments scheduled per 05/30/17 los. Left message on voicemail with appointment details. Appointment letter and schedule mailed per 05/30/17 los.

## 2017-06-04 ENCOUNTER — Encounter (HOSPITAL_COMMUNITY): Payer: Self-pay

## 2017-06-04 ENCOUNTER — Other Ambulatory Visit: Payer: Self-pay | Admitting: Hematology

## 2017-06-04 ENCOUNTER — Ambulatory Visit (HOSPITAL_COMMUNITY)
Admission: RE | Admit: 2017-06-04 | Discharge: 2017-06-04 | Disposition: A | Discharge status: Home / Self Care | Payer: Federal, State, Local not specified - PPO | Source: Ambulatory Visit | Attending: Hematology | Admitting: Hematology

## 2017-06-04 DIAGNOSIS — C210 Malignant neoplasm of anus, unspecified: Secondary | ICD-10-CM | POA: Diagnosis not present

## 2017-06-04 DIAGNOSIS — M1990 Unspecified osteoarthritis, unspecified site: Secondary | ICD-10-CM | POA: Diagnosis not present

## 2017-06-04 DIAGNOSIS — Z880 Allergy status to penicillin: Secondary | ICD-10-CM | POA: Insufficient documentation

## 2017-06-04 DIAGNOSIS — F419 Anxiety disorder, unspecified: Secondary | ICD-10-CM | POA: Diagnosis not present

## 2017-06-04 DIAGNOSIS — K219 Gastro-esophageal reflux disease without esophagitis: Secondary | ICD-10-CM | POA: Diagnosis not present

## 2017-06-04 DIAGNOSIS — Z8673 Personal history of transient ischemic attack (TIA), and cerebral infarction without residual deficits: Secondary | ICD-10-CM | POA: Diagnosis not present

## 2017-06-04 DIAGNOSIS — G709 Myoneural disorder, unspecified: Secondary | ICD-10-CM | POA: Diagnosis not present

## 2017-06-04 DIAGNOSIS — Z9221 Personal history of antineoplastic chemotherapy: Secondary | ICD-10-CM | POA: Insufficient documentation

## 2017-06-04 DIAGNOSIS — Z7901 Long term (current) use of anticoagulants: Secondary | ICD-10-CM | POA: Insufficient documentation

## 2017-06-04 DIAGNOSIS — G629 Polyneuropathy, unspecified: Secondary | ICD-10-CM | POA: Insufficient documentation

## 2017-06-04 DIAGNOSIS — Z923 Personal history of irradiation: Secondary | ICD-10-CM | POA: Insufficient documentation

## 2017-06-04 DIAGNOSIS — T82598A Other mechanical complication of other cardiac and vascular devices and implants, initial encounter: Secondary | ICD-10-CM | POA: Insufficient documentation

## 2017-06-04 DIAGNOSIS — J45909 Unspecified asthma, uncomplicated: Secondary | ICD-10-CM | POA: Insufficient documentation

## 2017-06-04 DIAGNOSIS — Y712 Prosthetic and other implants, materials and accessory cardiovascular devices associated with adverse incidents: Secondary | ICD-10-CM | POA: Diagnosis not present

## 2017-06-04 HISTORY — PX: IR VENO/EXT/UNI LEFT: IMG675

## 2017-06-04 HISTORY — PX: IR REMOVAL TUN ACCESS W/ PORT W/O FL MOD SED: IMG2290

## 2017-06-04 HISTORY — PX: IR IMAGING GUIDED PORT INSERTION: IMG5740

## 2017-06-04 HISTORY — PX: IR US GUIDE VASC ACCESS LEFT: IMG2389

## 2017-06-04 LAB — CBC WITH DIFFERENTIAL/PLATELET
Basophils Absolute: 0 10*3/uL (ref 0.0–0.1)
Basophils Relative: 0 %
EOS ABS: 0.1 10*3/uL (ref 0.0–0.7)
Eosinophils Relative: 2 %
HEMATOCRIT: 36.4 % (ref 36.0–46.0)
Hemoglobin: 12.1 g/dL (ref 12.0–15.0)
LYMPHS ABS: 1.2 10*3/uL (ref 0.7–4.0)
Lymphocytes Relative: 22 %
MCH: 26.7 pg (ref 26.0–34.0)
MCHC: 33.2 g/dL (ref 30.0–36.0)
MCV: 80.4 fL (ref 78.0–100.0)
MONOS PCT: 13 %
Monocytes Absolute: 0.7 10*3/uL (ref 0.1–1.0)
NEUTROS ABS: 3.4 10*3/uL (ref 1.7–7.7)
Neutrophils Relative %: 63 %
Platelets: 393 10*3/uL (ref 150–400)
RBC: 4.53 MIL/uL (ref 3.87–5.11)
RDW: 16.6 % — ABNORMAL HIGH (ref 11.5–15.5)
WBC: 5.5 10*3/uL (ref 4.0–10.5)

## 2017-06-04 LAB — PROTIME-INR
INR: 1.11
Prothrombin Time: 14.4 seconds (ref 11.4–15.2)

## 2017-06-04 MED ORDER — MIDAZOLAM HCL 2 MG/2ML IJ SOLN
INTRAMUSCULAR | Status: AC | PRN
Start: 2017-06-04 — End: 2017-06-04
  Administered 2017-06-04 (×6): 1 mg via INTRAVENOUS

## 2017-06-04 MED ORDER — HEPARIN SOD (PORK) LOCK FLUSH 100 UNIT/ML IV SOLN
INTRAVENOUS | Status: AC | PRN
  Administered 2017-06-04: 500 [IU] via INTRAVENOUS

## 2017-06-04 MED ORDER — VANCOMYCIN HCL IN DEXTROSE 1-5 GM/200ML-% IV SOLN
INTRAVENOUS | Status: AC
  Administered 2017-06-04: 1000 mg via INTRAVENOUS
  Filled 2017-06-04: qty 200

## 2017-06-04 MED ORDER — MIDAZOLAM HCL 2 MG/2ML IJ SOLN
INTRAMUSCULAR | Status: AC
  Filled 2017-06-04: qty 6

## 2017-06-04 MED ORDER — FENTANYL CITRATE (PF) 100 MCG/2ML IJ SOLN
INTRAMUSCULAR | Status: AC | PRN
  Administered 2017-06-04 (×4): 50 ug via INTRAVENOUS

## 2017-06-04 MED ORDER — HEPARIN SOD (PORK) LOCK FLUSH 100 UNIT/ML IV SOLN
INTRAVENOUS | Status: AC
  Filled 2017-06-04: qty 5

## 2017-06-04 MED ORDER — VANCOMYCIN HCL IN DEXTROSE 1-5 GM/200ML-% IV SOLN
1000.0000 mg | INTRAVENOUS | Status: AC
  Administered 2017-06-04: 1000 mg via INTRAVENOUS

## 2017-06-04 MED ORDER — IOPAMIDOL (ISOVUE-300) INJECTION 61%
50.0000 mL | Freq: Once | INTRAVENOUS | Status: AC | PRN
  Administered 2017-06-04: 10 mL via INTRAVENOUS

## 2017-06-04 MED ORDER — LIDOCAINE-EPINEPHRINE (PF) 2 %-1:200000 IJ SOLN
INTRAMUSCULAR | Status: AC | PRN
  Administered 2017-06-04 (×3): 20 mL

## 2017-06-04 MED ORDER — SODIUM CHLORIDE 0.9 % IV SOLN
INTRAVENOUS | Status: DC
  Administered 2017-06-04: 13:00:00 via INTRAVENOUS

## 2017-06-04 MED ORDER — FENTANYL CITRATE (PF) 100 MCG/2ML IJ SOLN
INTRAMUSCULAR | Status: AC
  Filled 2017-06-04: qty 4

## 2017-06-04 MED ORDER — IOPAMIDOL (ISOVUE-300) INJECTION 61%
INTRAVENOUS | Status: AC
  Filled 2017-06-04: qty 50

## 2017-06-04 MED ORDER — LIDOCAINE-EPINEPHRINE (PF) 2 %-1:200000 IJ SOLN
INTRAMUSCULAR | Status: AC
  Filled 2017-06-04: qty 20

## 2017-06-04 NOTE — H&P (Signed)
Referring Physician(s): Feng,Yan  Supervising Physician: Sandi Mariscal  Patient Status:  [facility]  Chief Complaint:  "I'm getting my port fixed"  Subjective: Patient familiar to IR service from prior retroperitoneal lymph node biopsy in May of this year.She has a history of metastatic anal squamous cell carcinoma ,status post chemoradiation and currently on immunotherapy.She had a right chest wall Port-A-Cath placed in [place] 2016. There has been some difficulty with use of port  and recent imaging has revealed malpositioned port tip. She presents today for Port-A-Cath revision/possible new placement. She currently denies fever, headache, chest pain, dyspnea, cough, back pain, worsening abd pain, vomiting or abnormal bleeding. She does have fatigue, some occasional indigestion and nausea. Past Medical History:  Diagnosis Date  . Anxiety   . Arthritis   . Asthma    childhood   . Cancer [facility])    colorectal cancer with chemo and radiation   . DVT (deep venous thrombosis) (HCC)    left lower extremity  . Dyspnea    with anxiety and exertion  . GERD (gastroesophageal reflux disease)   . Neuromuscular disorder (HCC)    poly neuropathy   . Stroke [facility]) 2010, 2011   TIA  x2   Past Surgical History:  Procedure Laterality Date  . CESAREAN SECTION    . FLEXIBLE SIGMOIDOSCOPY N/A 04/17/2017   Procedure: FLEXIBLE SIGMOIDOSCOPY;  Surgeon: Ronald Lobo, MD;  Location: [facility] ENDOSCOPY;  Service: Endoscopy;  Laterality: N/A;  . THYROID CYST EXCISION    . TOTAL HIP ARTHROPLASTY Left 03/25/2017   Procedure: LEFT TOTAL HIP ARTHROPLASTY ANTERIOR APPROACH;  Surgeon: Paralee Cancel, MD;  Location: [facility] ORS;  Service: Orthopedics;  Laterality: Left;  Requests 70 mins      Allergies: Ativan [lorazepam] and Penicillins  Medications: Prior to Admission medications   Medication Sig Start Date End Date Taking? Authorizing Provider  bismuth subsalicylate (PEPTO BISMOL) 262 MG chewable tablet Chew  524 mg by mouth daily as needed for indigestion or diarrhea or loose stools.    [provider]  docusate sodium (COLACE) 100 MG capsule Take 1 capsule (100 mg total) by mouth 2 (two) times daily. Patient not taking: Reported on 04/30/2017 03/25/17   Danae Orleans, PA-C  doxycycline (VIBRAMYCIN) 100 MG capsule Take 1 capsule (100 mg total) by mouth 2 (two) times daily. 03/27/17   Danae Orleans, PA-C  dronabinol (MARINOL) 5 MG capsule Take 1 capsule (5 mg total) by mouth 2 (two) times daily before a meal. 05/16/17   Truitt Merle, MD  Efinaconazole (JUBLIA) 10 % SOLN Apply 1 application topically daily.    [provider]  ferrous sulfate (FERROUSUL) 325 (65 FE) MG tablet Take 1 tablet (325 mg total) by mouth 3 (three) times daily with meals. Patient not taking: Reported on 05/16/2017 03/25/17   Danae Orleans, PA-C  HYDROcodone-acetaminophen (NORCO) 7.5-325 MG tablet Take 1-2 tablets by mouth every 4 (four) hours as needed for moderate pain or severe pain. 03/25/17   Danae Orleans, PA-C  LACTULOSE PO Take by mouth.    [provider]  lidocaine-prilocaine (EMLA) cream Apply 1 application topically as needed. 05/16/17   Truitt Merle, MD  Methenamine-Sodium Salicylate (AZO URINARY TRACT DEFENSE PO) Take 2 tablets by mouth daily as needed (uti symptoms).    [provider]  methocarbamol (ROBAXIN) 500 MG tablet Take 1 tablet (500 mg total) by mouth every 6 (six) hours as needed for muscle spasms. 03/25/17   Danae Orleans, PA-C  metoCLOPramide (REGLAN) 10  MG tablet Take 1 tablet (10 mg total) by mouth 4 (four) times daily -  before meals and at bedtime. 05/22/17   Truitt Merle, MD  omeprazole (PRILOSEC) 20 MG capsule Take 20 mg by mouth daily.    [provider]  ondansetron (ZOFRAN ODT) 8 MG disintegrating tablet Take 1 tablet (8 mg total) by mouth every 8 (eight) hours as needed for nausea or vomiting. 05/30/17   Truitt Merle, MD  ondansetron (ZOFRAN-ODT) 4 MG disintegrating  tablet Take 1 tablet (4 mg total) by mouth every 8 (eight) hours as needed for nausea or vomiting. 04/23/17   Patrecia Pour, Christean Grief, MD  polyethylene glycol [facility] Raveling) packet Take 17 g by mouth 2 (two) times daily. 03/25/17   Danae Orleans, PA-C  rivaroxaban (XARELTO) 20 MG TABS tablet Take 1 tablet (20 mg total) by mouth daily with supper. Please follow up with PCP to continue this medication. 03/27/17   Danae Orleans, PA-C  saccharomyces boulardii (FLORASTOR) 250 MG capsule Take 250 mg by mouth daily.    [provider]     Vital Signs: BP 139/65   Pulse 75   Temp 98 F (36.7 C) (Oral)   Resp 18   Ht 5' (1.524 m)   Wt 143 lb (64.9 kg)   SpO2 99%   BMI 27.93 kg/m   Physical Exam Awake, alert. Chest clear to auscultation bilaterally.Clean, intact right chest wall Port-A-Cath. Heart with regular rate and rhythm.Abdomen soft, positive bowel sounds,currently  nontender. Lower extremities with no significant pretibial edema  Imaging: No results found.  Labs:  CBC:  Recent Labs  04/30/17 1519 05/16/17 1119 05/30/17 1118 06/04/17 1232  WBC 7.7 5.8 6.2 5.5  HGB 11.2* 11.5* 11.8 12.1  HCT 34.0* 35.2 36.3 36.4  PLT 412* 452* 410* 393    COAGS:  Recent Labs  04/17/17 0638 04/22/17 0529  INR 1.18 1.07  APTT 34  --     BMP:  Recent Labs  04/20/17 0536 04/21/17 0525 04/22/17 0529 04/23/17 0536 04/30/17 1519 05/16/17 1119 05/30/17 1119  NA 136 136 139 138 142 139 142  K 3.0* 2.9* 3.3* 3.7 3.7 3.9 4.0  CL 104 103 107 107  --   --   --   CO2 22 23 26 25 28 25 27   GLUCOSE 74 93 99 99 107 90 99  BUN 6 <5* <5* <5* 6.0* 6.4* 7.1  CALCIUM 8.7* 8.8* 8.8* 8.9 9.3 9.3 9.2  CREATININE 0.55 0.42* 0.48 0.46 0.7 0.6 0.6  GFRNONAA >60 >60 >60 >60  --   --   --   GFRAA >60 >60 >60 >60  --   --   --     LIVER FUNCTION TESTS:  Recent Labs  04/22/17 0529 04/30/17 1519 05/16/17 1119 05/30/17 1119  BILITOT 0.9 0.37 0.29 0.23  AST 14* 11 10 12   ALT 8* 7  <6 <6  ALKPHOS 74 80 98 98  PROT 6.2* 6.5 6.9 6.9  ALBUMIN 3.4* 3.3* 3.5 3.4*    Assessment and Plan:  Pt with history of metastatic anal squamous cell carcinoma ,status post chemoradiation and currently on immunotherapy.She had a right chest wall Port-A-Cath placed in [place] 2016. There has been some difficulty with use of port  and recent imaging has revealed malpositioned port tip. She presents today for Port-A-Cath revision/possible new placement. Risks and benefits discussed with the patient/daughter including, but not limited to bleeding, infection, pneumothorax, or fibrin sheath development and need for additional  procedures.All of the patient's questions were answered, patient is agreeable to proceed.Consent signed and in chart.     Electronically Signed: D. Rowe Robert, PA-C 06/04/2017, 1:10 PM   I spent a total of 20 minutes at the the patient's bedside AND on the patient's hospital floor or unit, greater than 50% of which was counseling/coordinating care for Port-A-Cath revision/possible new placement

## 2017-06-04 NOTE — Procedures (Signed)
Pre Procedure Dx: Anal Cancer Post Procedural Dx: Same  Successful removal of malfunctioning R IJ port-a-cath.  Successful placement of left IJ approach port-a-cath with tip at the superior caval atrial junction. The catheter is ready for immediate use.  Estimated Blood Loss: Minimal  Complications: None immediate.  Ronny Bacon, MD Pager #: [phone]

## 2017-06-04 NOTE — Discharge Instructions (Signed)
Moderate Conscious Sedation, Adult, Care After °These instructions provide you with information about caring for yourself after your procedure. Your health care provider may also give you more specific instructions. Your treatment has been planned according to current medical practices, but problems sometimes occur. Call your health care provider if you have any problems or questions after your procedure. °What can I expect after the procedure? °After your procedure, it is common: °· To feel sleepy for several hours. °· To feel clumsy and have poor balance for several hours. °· To have poor judgment for several hours. °· To vomit if you eat too soon. ° °Follow these instructions at home: °For at least 24 hours after the procedure: ° °· Do not: °? Participate in activities where you could fall or become injured. °? Drive. °? Use heavy machinery. °? Drink alcohol. °? Take sleeping pills or medicines that cause drowsiness. °? Make important decisions or sign legal documents. °? Take care of children on your own. °· Rest. °Eating and drinking °· Follow the diet recommended by your health care provider. °· If you vomit: °? Drink water, juice, or soup when you can drink without vomiting. °? Make sure you have little or no nausea before eating solid foods. °General instructions °· Have a responsible adult stay with you until you are awake and alert. °· Take over-the-counter and prescription medicines only as told by your health care provider. °· If you smoke, do not smoke without supervision. °· Keep all follow-up visits as told by your health care provider. This is important. °Contact a health care provider if: °· You keep feeling nauseous or you keep vomiting. °· You feel light-headed. °· You develop a rash. °· You have a fever. °Get help right away if: °· You have trouble breathing. °This information is not intended to replace advice given to you by your health care provider. Make sure you discuss any questions you have  with your health care provider. °Document Released: 09/29/2013 Document Revised: 05/13/2016 Document Reviewed: 03/30/2016 °Elsevier Interactive Patient Education © 2018 [employer] °Implanted Port Insertion, Care After °This sheet gives you information about how to care for yourself after your procedure. Your health care provider may also give you more specific instructions. If you have problems or questions, contact your health care provider. °What can I expect after the procedure? °After your procedure, it is common to have: °· Discomfort at the port insertion site. °· Bruising on the skin over the port. This should improve over 3-4 days. ° °Follow these instructions at home: °Port care °· After your port is placed, you will get a manufacturer's information card. The card has information about your port. Keep this card with you at all times. °· Take care of the port as told by your health care provider. Ask your health care provider if you or a family member can get training for taking care of the port at home. A home health care nurse may also take care of the port. °· Make sure to remember what type of port you have. °Incision care °· Follow instructions from your health care provider about how to take care of your port insertion site. Make sure you: °? Wash your hands with soap and water before you change your bandage (dressing). If soap and water are not available, use hand sanitizer. °? Change your dressing as told by your health care provider. °? Leave stitches (sutures), skin glue, or adhesive strips in place. These skin closures may need to stay   in place for 2 weeks or longer. If adhesive strip edges start to loosen and curl up, you may trim the loose edges. Do not remove adhesive strips completely unless your health care provider tells you to do that. °· Check your port insertion site every day for signs of infection. Check for: °? More redness, swelling, or pain. °? More fluid or  blood. °? Warmth. °? Pus or a bad smell. °General instructions °· Do not take baths, swim, or use a hot tub until your health care provider approves. °· Do not lift anything that is heavier than 10 lb (4.5 kg) for a week, or as told by your health care provider. °· Ask your health care provider when it is okay to: °? Return to work or school. °? Resume usual physical activities or sports. °· Do not drive for 24 hours if you were given a medicine to help you relax (sedative). °· Take over-the-counter and prescription medicines only as told by your health care provider. °· Wear a medical alert bracelet in case of an emergency. This will tell any health care providers that you have a port. °· Keep all follow-up visits as told by your health care provider. This is important. °Contact a health care provider if: °· You cannot flush your port with saline as directed, or you cannot draw blood from the port. °· You have a fever or chills. °· You have more redness, swelling, or pain around your port insertion site. °· You have more fluid or blood coming from your port insertion site. °· Your port insertion site feels warm to the touch. °· You have pus or a bad smell coming from the port insertion site. °Get help right away if: °· You have chest pain or shortness of breath. °· You have bleeding from your port that you cannot control. °Summary °· Take care of the port as told by your health care provider. °· Change your dressing as told by your health care provider. °· Keep all follow-up visits as told by your health care provider. °This information is not intended to replace advice given to you by your health care provider. Make sure you discuss any questions you have with your health care provider. °Document Released: 09/29/2013 Document Revised: 10/30/2016 Document Reviewed: 10/30/2016 °Elsevier Interactive Patient Education © 2017 [employer] Inc. ° °

## 2017-06-09 ENCOUNTER — Telehealth: Payer: Self-pay

## 2017-06-09 ENCOUNTER — Encounter: Payer: Self-pay | Admitting: Nutrition

## 2017-06-09 ENCOUNTER — Encounter: Payer: Federal, State, Local not specified - PPO | Admitting: Nutrition

## 2017-06-09 NOTE — Telephone Encounter (Signed)
If she feels fine, she does not need to be seen on 6/22 if treatment is cancelled, reschedule as soon as we know her drug replacement is completed. Thanks   Truitt Merle MD

## 2017-06-09 NOTE — Progress Notes (Signed)
Patient did not show up for scheduled nutrition appointment. 

## 2017-06-09 NOTE — Telephone Encounter (Signed)
Daughter called. Pt was enrolled in financial program that her insurance denied. Rob is working on Psychiatrist and stated it could be 1-2 weeks. The pt is for 6/22 lab/ flush/ MD/ infusion. Daughter wants to keep appt until the last minute in case finances come through. If finances are not finalized, will she need to keep any part of this appts?  Pt got her port replaced on 6/13.  Daughter was asking about when to take off dressing and when to use EMLA. Instructed her to take off dressing down to the dermabond and steristrips but to leave those alone. and to use EMLA only at time of appt for needle stick.

## 2017-06-10 ENCOUNTER — Telehealth: Payer: Self-pay

## 2017-06-10 NOTE — Telephone Encounter (Signed)
[name] called that pt is having some ear pain and neck pain after port placed 6/13. 1st attempt call back no answer.

## 2017-06-10 NOTE — Telephone Encounter (Signed)
The neck has been hurting since the port was placed. The dressing was taken off yesterday. No bruising noted. No swelling over port or up neck. No redness/pussy or signs of infection. No heat. Today a little better than it has been. Feels the tenderness when swallow, or trying to pull self up in bed or turn over to get out of bed. Explained the process of getting the port placed can irritate the area d/t the tugging that goes on. Instructed to put ice pack on area with washcloth between pack and skin for 5-10 minutes 4 times today.  Pt was releived with this information. She will keep eye on area and if it worsens she will call.

## 2017-06-10 NOTE — Telephone Encounter (Signed)
lvm with [name] per Dr Ernestina Penna note.

## 2017-06-11 ENCOUNTER — Encounter: Payer: Federal, State, Local not specified - PPO | Admitting: Nutrition

## 2017-06-13 ENCOUNTER — Ambulatory Visit: Payer: Federal, State, Local not specified - PPO

## 2017-06-13 ENCOUNTER — Ambulatory Visit: Payer: Federal, State, Local not specified - PPO | Admitting: Nurse Practitioner

## 2017-06-13 ENCOUNTER — Other Ambulatory Visit: Payer: Federal, State, Local not specified - PPO

## 2017-06-18 ENCOUNTER — Telehealth: Payer: Self-pay | Admitting: *Deleted

## 2017-06-18 DIAGNOSIS — R112 Nausea with vomiting, unspecified: Secondary | ICD-10-CM

## 2017-06-18 DIAGNOSIS — C210 Malignant neoplasm of anus, unspecified: Secondary | ICD-10-CM

## 2017-06-18 MED ORDER — DRONABINOL 5 MG PO CAPS: 5.0000 mg | ORAL_CAPSULE | Freq: Two times a day (BID) | ORAL | 0 refills | Status: DC

## 2017-06-18 NOTE — Telephone Encounter (Signed)
"  This is Lavell Anchors calling on behalf of my mom.  She needs THC pills refilled."  Called for clarification.  "Marinol 5 mg tablets. She has five pills left which means she will run out Friday."  Will call when ready for pick up.

## 2017-06-19 MED FILL — DRONABINOL 5 MG CAPSULE: 5 | 30 days supply | Qty: 60 | Fill #0

## 2017-06-19 NOTE — Telephone Encounter (Signed)
Message left on voicemail that Marinol prescription is ready for pick up.

## 2017-06-24 NOTE — Progress Notes (Signed)
[facility]  [phone] [phone]  Clinic Follow up Note   Patient Care Team: Damaris Hippo, MD as PCP - General (Family Medicine) 06/27/2017  SUMMARY OF ONCOLOGIC HISTORY: Oncology History   [id number] stage IIIB      Anal cancer ([place])   03/09/2015 Initial Diagnosis    Rectal cancer ([place])      04/19/2015 Imaging    PET scan showed hypermetabolic rectal lesion and inguinal lymph nodes      05/08/2015 - 07/13/2015 Radiation Therapy    59 Gy in 35 fractions to pelvis and inguinal LNs for her anal cancer. She tolerated treatment poorly with persistent nausea, vomiting, diarrhea and a poor appetite throughout treatment, and required several hospitalization and treatment delays. She did complete the planned treatment.       05/08/2015 - 06/2015 Chemotherapy    Concurrent chemotherapy with 5-FU and mitomycin along with radiation. Pt tolerated poorly.       01/08/2016 Imaging    PET scan showed you hypermetabolic left mediastinal 1L adenopathy (1.5cm), partial response to therapy with interval decrease in extent of hypermetabolic rectal soft tissue fullness as well as resolution of inguinal adenopathy.      02/06/2017 Procedure    Colonoscopy showed scar tissue just off anal verge, no lesion otherwise.      04/16/2017 - 04/23/2017 Hospital Admission    Admitted 04/16/17 - 04/23/17 for Nausea and Vomiting and abdominal pain. CT abd noted to have findings including rectal mass and enlarged nodes. Patient underwent sigmoidoscopy with biopsy showing benign findings. IR was consulted for node biopsy and which was done and awaiting results.      04/17/2017 Imaging    CT AP W Contrast 04/17/17: IMPRESSION: 1. Left posterior perirectal lobulated mass concerning for rectal malignancy. A smaller nodular density superior to this mass likely represent a mildly enlarged metastatic node. MRI is recommended for further evaluation. 2. Retroperitoneal adenopathy most  consistent with metastatic disease, likely from rectal neoplasm. A hypodense lesion to the left of the L5 posterior to the psoas muscle is also consistent with metastatic disease. 3. Multiple hepatic hypodense lesions, incompletely characterized, possibly metastatic. MRI is recommended for further evaluation. 4. No evidence of bowel obstruction or active inflammation. Normal appendix.      04/17/2017 Pathology Results    Diagnosis Rectum, biopsy, distal - BENIGN COLONIC MUCOSA WITH EROSIONS AND ULCERATION, SEE COMMENT. - NO DYSPLASIA OR MALIGNANCY.       04/22/2017 Pathology Results    Diagnosis Lymph node, needle/core biopsy, left retroperitoneum periaortic - POORLY DIFFERENTIATED SQUAMOUS CELL CARCINOMA - SEE COMMENT Microscopic Comment The needle core biopsy has a basaloid neoplasm with increased mitoses and necrosis. There is no residual lymph node parenchyma identified. The neoplasm is immunoreactive for cytokeratin 7 (patchy), cytokeratin 5/6, and p16 but negative for CDX2 and cytokeratin 20. Overall, the histology and immunophenotype support the diagnosis of a poorly differentiated squamous cell carcinoma.       05/13/2017 PET scan    IMPRESSION: 1. Perirectal adenopathy and posterior perirectal hypermetabolic mass, associated with presumed metastatic adenopathy in knee left common iliac, retroperitoneal, thoracic paraesophageal, and left upper mediastinal regions. The left upper mediastinal mass extends into the left supraclavicular space. 2. Metastatic disease to the liver (best seen in segment 3) along with a hypermetabolic nodule compatible with metastatic disease in the superior segment of the lower lobe of the left lung.      05/16/2017 -  Antibody Plan     Nivolumab  240mg  every 2 weeks started on 05/16/17       History of Present Illness (04/17/17) [name] is a 61 y.o. 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 I was called to see her in hospital.   She is from [place], with past medical history of DVT on Xarelto, rectal cancer status post chemotherapy and radiation in 2016, stroke, asthma, who was admitted to hospital last night for nausea, vomiting and abdominal pain. CT of abdomen and pelvis showed left posterior perirectal lobulated mass concerning for rectum malignancy. Retroperitoneal adenopathy, I hypodense lesion to the left L5 to the sore S muscle, suspicious for metastasis, and multiple hepatic hypodense lesions, indeterminate. I was called to see the patient to ruled out metastatic rectal cancer recurrence. I saw the patient before her endoscopy today.  The patient reports she has a prior history of rectal cancer stage 3, squamous cell, which was treated with chemotherapy and radiation therapy. She did not have surgery. The patient reports she completed treatment in September 2016. The patient has not used her port since 2016, though it is still in place. She reports the last time she followed up with physicians for her prior cancer, they told her she had suspicious masses in her lungs and liver.  The patient does not have any medical records from [place], where she previously lived and was treated, but reports her daughter would have them. She reports she does not know if she plans to stay in [place], or if she will return to [place].    Current Treatment:  Nivolumab 240mg  every 2 weeks started on 05/16/17   INTERVAL HISTORY:  [name] is here for a follow up. She presents to the clinic today with her daughter. She reports to feeling tired a lot and having trouble sleeping. She gets 5-6 hours at a time but wakes up through out the night. She is not taking anything for sleeping. She is weaning off of the opoid medication and can wake her. She is taking hydrocodone every 12 hours. Every 8 hours worked for her better. She thinks it is too early to say if  she wants something to supplement.  She reports to doing better with nausea overall but needs more Zofran. She is using Zofran as needed and Reglan daily.  She feels her head is often "foggy" and thinks it is due to her hydrocodone medications.Shereports no shaking or feeling that is it hard to move, stiffness. She does feel that in bretween her hydrocodone she gets spasms and her body tightening up. When medicated it goes away.  She is eating more and is up more often. Her daughter thinks she is more energetic than before.  She thinks if she does not feel mentally better after stopping hydrocodone she feels she may need help with her depression. She reports to being "weepy". Her daughter wishes she can change the days they come in. Patient feels home sick and wants to know when she can take a break to leave.  Russ Halo did not work for the patient but they will try Colace.    REVIEW OF SYSTEMS:   Constitutional: Denies fevers, chills or abnormal weight loss  (+) fatigue (+) "fogginess" (+) slight increase in energy Eyes: Denies blurriness of vision Ears, nose, mouth, throat, and face: Denies mucositis or sore throat Respiratory: Denies cough, dyspnea or wheezes Cardiovascular: Denies palpitation, chest discomfort or lower extremity swelling Gastrointestinal:  Denies heartburn  (+)  nausea  Skin: Denies abnormal skin rashes Lymphatics: Denies new lymphadenopathy or easy bruising Neurological:Denies numbness, tingling or new weaknesses MSK: (+) hip pain from surgery Behavioral/Psych: Mood is stable, no new changes (+) "weepy", depressed All other systems were reviewed with the patient and are negative.  MEDICAL HISTORY:  Past Medical History:  Diagnosis Date  . Anxiety   . Arthritis   . Asthma    childhood   . Cancer [facility])    colorectal cancer with chemo and radiation   . DVT (deep venous thrombosis) ([facility])    left lower extremity  . Dyspnea    with anxiety and exertion  . GERD  (gastroesophageal reflux disease)   . Neuromuscular disorder ([facility])    poly neuropathy   . Stroke [facility]) 2010, 2011   TIA  x2    SURGICAL HISTORY: Past Surgical History:  Procedure Laterality Date  . CESAREAN SECTION    . FLEXIBLE SIGMOIDOSCOPY N/A 04/17/2017   Procedure: FLEXIBLE SIGMOIDOSCOPY;  Surgeon: Ronald Lobo, MD;  Location: [facility] ENDOSCOPY;  Service: Endoscopy;  Laterality: N/A;  . IR FLUORO GUIDE PORT INSERTION LEFT  06/04/2017  . IR REMOVAL TUN ACCESS W/ PORT W/O FL MOD SED  06/04/2017  . IR US GUIDE VASC ACCESS LEFT  06/04/2017  . IR VENO/EXT/UNI LEFT  06/04/2017  . THYROID CYST EXCISION    . TOTAL HIP ARTHROPLASTY Left 03/25/2017   Procedure: LEFT TOTAL HIP ARTHROPLASTY ANTERIOR APPROACH;  Surgeon: Paralee Cancel, MD;  Location: [facility] ORS;  Service: Orthopedics;  Laterality: Left;  Requests 70 mins    I have reviewed the social history and family history with the patient and they are unchanged from previous note.  ALLERGIES:  is allergic to ativan [lorazepam] and penicillins.  MEDICATIONS:  Current Outpatient Prescriptions  Medication Sig Dispense Refill  . bismuth subsalicylate (PEPTO BISMOL) 262 MG chewable tablet Chew 524 mg by mouth daily as needed for indigestion or diarrhea or loose stools.    Marland Kitchen doxycycline (VIBRAMYCIN) 100 MG capsule Take 1 capsule (100 mg total) by mouth 2 (two) times daily. 60 capsule 1  . dronabinol (MARINOL) 5 MG capsule Take 1 capsule (5 mg total) by mouth 2 (two) times daily before a meal. 60 capsule 0  . Efinaconazole (JUBLIA) 10 % SOLN Apply 1 application topically daily.    Marland Kitchen HYDROcodone-acetaminophen (NORCO) 7.5-325 MG tablet Take 1-2 tablets by mouth every 4 (four) hours as needed for moderate pain or severe pain. (Patient taking differently: Take 1-2 tablets by mouth every 12 (twelve) hours as needed for moderate pain or severe pain. ) 60 tablet 0  . LACTULOSE PO Take by mouth.    . lidocaine-prilocaine (EMLA) cream Apply 1 application topically  as needed. 30 g 6  . methocarbamol (ROBAXIN) 500 MG tablet Take 1 tablet (500 mg total) by mouth every 6 (six) hours as needed for muscle spasms. 40 tablet 0  . metoCLOPramide (REGLAN) 10 MG tablet Take 1 tablet (10 mg total) by mouth 4 (four) times daily -  before meals and at bedtime. 120 tablet 0  . ondansetron (ZOFRAN ODT) 8 MG disintegrating tablet Take 1 tablet (8 mg total) by mouth every 8 (eight) hours as needed for nausea or vomiting. 30 tablet 2  . polyethylene glycol (MIRALAX / GLYCOLAX) packet Take 17 g by mouth 2 (two) times daily. 14 each 0  . rivaroxaban (XARELTO) 20 MG TABS tablet Take 1 tablet (20 mg total) by mouth daily with supper. Please follow up with PCP  to continue this medication. 30 tablet 0  . saccharomyces boulardii (FLORASTOR) 250 MG capsule Take 250 mg by mouth daily.    Marland Kitchen acetaminophen-codeine (TYLENOL #3) 300-30 MG tablet Take 1 tablet by mouth every 4 (four) hours as needed for moderate pain. 30 tablet 0  . docusate sodium (COLACE) 100 MG capsule Take 1 capsule (100 mg total) by mouth 2 (two) times daily. (Patient not taking: Reported on 04/30/2017) 10 capsule 0  . ferrous sulfate (FERROUSUL) 325 (65 FE) MG tablet Take 1 tablet (325 mg total) by mouth 3 (three) times daily with meals. (Patient not taking: Reported on 05/16/2017)    . omeprazole (PRILOSEC) 20 MG capsule Take 20 mg by mouth daily.     No current facility-administered medications for this visit.     PHYSICAL EXAMINATION:  ECOG PERFORMANCE STATUS: 3 - Symptomatic, >50% confined to bed BP (!) 115/58 (BP Location: Left Arm, Patient Position: Sitting) Comment: RN Myrtle aware of bp  Pulse 71   Temp 97.6 F (36.4 C) (Oral)   Resp 18   Ht 5' (1.524 m)   SpO2 100%    GENERAL:alert, moderately distress due to nausea and abdominal discomfort  SKIN: skin color, texture, turgor are normal, no rashes or significant lesions EYES: normal, Conjunctiva are pink and non-injected, sclera clear OROPHARYNX:no  exudate, no erythema and lips, buccal mucosa, and tongue normal  NECK: supple, thyroid normal size, non-tender, without nodularity LYMPH:  no palpable lymphadenopathy in the cervical, axillary or inguinal LUNGS: clear to auscultation and percussion with normal breathing effort HEART: regular rate & rhythm and no murmurs and no lower extremity edema ABDOMEN:abdomen soft, mild diffuse tenderness, normal bowel sounds Musculoskeletal:no cyanosis of digits and no clubbing  NEURO: alert & oriented x 3 with fluent speech, no focal motor/sensory deficits  LABORATORY DATA:  I have reviewed the data as listed CBC Latest Ref Rng & Units 06/27/2017 06/04/2017 05/30/2017  WBC 3.9 - 10.3 10e3/uL 5.9 5.5 6.2  Hemoglobin 11.6 - 15.9 g/dL 12.4 12.1 11.8  Hematocrit 34.8 - 46.6 % 38.1 36.4 36.3  Platelets 145 - 400 10e3/uL 353 393 410(H)     CMP Latest Ref Rng & Units 06/27/2017 05/30/2017 05/16/2017  Glucose 70 - 140 mg/dl 98 99 90  BUN 7.0 - 26.0 mg/dL 7.4 7.1 6.4(L)  Creatinine 0.6 - 1.1 mg/dL 0.6 0.6 0.6  Sodium 136 - 145 mEq/L 140 142 139  Potassium 3.5 - 5.1 mEq/L 3.8 4.0 3.9  Chloride 101 - 111 mmol/L - - -  CO2 22 - 29 mEq/L 24 27 25   Calcium 8.4 - 10.4 mg/dL 9.4 9.2 9.3  Total Protein 6.4 - 8.3 g/dL 7.1 6.9 6.9  Total Bilirubin 0.20 - 1.20 mg/dL 0.23 0.23 0.29  Alkaline Phos 40 - 150 U/L 98 98 98  AST 5 - 34 U/L 11 12 10   ALT 0 - 55 U/L 7 <6 <6   PATHOLOGY:   Pathology Results: 04/22/17 Diagnosis Lymph node, needle/core biopsy, left retroperitoneum periaortic - POORLY DIFFERENTIATED SQUAMOUS CELL CARCINOMA - SEE COMMENT Microscopic Comment The needle core biopsy has a basaloid neoplasm with increased mitoses and necrosis. There is no residual lymph node parenchyma identified. The neoplasm is immunoreactive for cytokeratin 7 (patchy), cytokeratin 5/6, and p16 but negative for CDX2 and cytokeratin 20. Overall, the histology and immunophenotype support the diagnosis of a poorly differentiated  squamous cell carcinoma.  Surgical Pathology 04/17/2017 Diagnosis Rectum, biopsy, distal - BENIGN COLONIC MUCOSA WITH EROSIONS AND ULCERATION, SEE COMMENT. -  NO DYSPLASIA OR MALIGNANCY. Microscopic Comment There is lamina propria fibrosis and macrophages consistent with prior treatment. The case was called to Dr. Cristina Gong on 04/18/2017.  RADIOGRAPHIC STUDIES: I have personally reviewed the radiological images as listed and agreed with the findings in the report. No results found.   PET Scan 05/13/2017 IMPRESSION: 1. Perirectal adenopathy and posterior perirectal hypermetabolic mass, associated with presumed metastatic adenopathy in knee left common iliac, retroperitoneal, thoracic paraesophageal, and left upper mediastinal regions. The left upper mediastinal mass extends into the left supraclavicular space. 2. Metastatic disease to the liver (best seen in segment 3) along with a hypermetabolic nodule compatible with metastatic disease in the superior segment of the lower lobe of the left lung.   CT AP W Contrast 04/17/17: IMPRESSION: 1. Left posterior perirectal lobulated mass concerning for rectal malignancy. A smaller nodular density superior to this mass likely represent a mildly enlarged metastatic node. MRI is recommended for further evaluation. 2. 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 A hypodense lesion to the left of the L5 posterior to the psoas muscle is also consistent with metastatic disease. 3. Multiple hepatic hypodense lesions, incompletely characterized, possibly metastatic. MRI is recommended for further evaluation. 4. No evidence of bowel obstruction or active inflammation. Normal appendix.  ASSESSMENT & PLAN:  [name] is a 61 y.o. female from [place], with past medical history of DVT on Xarelto, rectal squamous cell carcinoma status post chemotherapy and radiation in 2016, stroke,  asthma, who was admitted to hospital recently for nausea, vomiting and abdominal pain. She has had those symptoms since her recent hip surgery on 04/21/2017.   1. 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 -We reviewed her recent CT scan findings, and her retroperitoneal lymph node biopsy results in details with patient and her daughter. -Unfortunately, biopsy confirmed metastatic disease to abdominal lymph nodes. Her CT scan is also suspicious for liver and pelvic metastasis. She was not able to tolerate MRI to evaluate her liver lesions. -We discussed the incurable nature of her disease at this stage, and the goal of therapy is palliative. -I previously discussed her staging PET scan results, which showed metastasis in liver, left lung and diffuse nodal metastasis in chest and abdomen.  -She has started first-line therapy with Nivolumab, tolerating well. - repeat scan after 4-6 treatments - I again requested her to see Pamala Hurry, the dietician - I previously prescribed dissolvable Zofran #20 pills -To help with hip pain while lessening her dose, overseen by her orthopedist, I suggest she can supplement hydrocodone with tylenol #3 -Labs reviewed and adequate to continue treatment.  -We will repeat scan after the 5th cycle.  -She has clinically doing better lately, lab reviewed, CBC and CMP are within normal limits. -we discussed that if she gets stronger and her cancer becomes more stable she can take a break to travel home.    2. Nausea, abdominal pain, secondary to constipation and metastatic cancer -Due to her sever nausea I have prescribed her Marinol 2.5mg  twice a day. -She'll continue Reglan, 3-4 a day and Zofran as needed  -overall improved some latley     3. Headaches  -secondary to nausea  -when the headache is bad she sees bright light  -Will monitor is Marinol aids in the nausea and headaches  4. Mild anemia and thrombocytosis -Likely secondary to her underlying metastatic cancer -Her previous iron study showed normal serum iron, decreased TIBC increased ferritin, this is consistent with anemia of chronic disease. -Continue monitoring.  5. Goal of care discussion  -We again discussed the incurable nature of her cancer, and the overall poor prognosis, especially if she does not have good response to chemotherapy or progress on chemo -The patient understands the goal of care is palliative. -I previously recommend DNR/DNI, she will think about it   6. Port issue --I asked radiology to look at port from her recent PET scan -  From scan, we noticed the tip of the port is too high, she understands we may have to revise her port. We will use her arm for treatment today -IR has revised her port   7. Depression and insomnia  -I suggest Mirtazapine, but she wishes to wait until after she is weaned off hydrocodone -In the meantime I suggest sleep aid like melatonin over-the-counter   PLAN:  -Order Tylenol #3, she wants to wait off hydrocodone which was ordered by her orthopedic surgeon -Refill Zofran and Reglan.  -Labs reviewed. She is adequate for 3rd dose Nivolimab today and will continue every 2 weeks  -will schedule Lab, flush, f/u and nivolumab in 4 and 6 weeks on Wednesdays  -Next treatment in 2 weeks, I'll see her back in 4 weeks. We'll order restaging scan on her next visit.  No orders of the defined types were placed in this encounter.  All questions were answered. The patient knows to call the clinic with any problems, questions or concerns. No barriers to learning was detected.  I spent 25 minutes counseling the patient face to face. The total time spent in the appointment was 30 minutes and more than 50% was on counseling  and review of test results  This document serves as a record of services personally performed by Truitt Merle, MD. It was created on her behalf by Joslyn Devon, a trained medical scribe. The creation of this record is based on the scribe's personal observations and the provider's statements to them. This document has been checked and approved by the attending provider. Truitt Merle, MD 06/27/2017

## 2017-06-27 ENCOUNTER — Ambulatory Visit (HOSPITAL_BASED_OUTPATIENT_CLINIC_OR_DEPARTMENT_OTHER): Payer: Federal, State, Local not specified - PPO | Admitting: Hematology

## 2017-06-27 ENCOUNTER — Other Ambulatory Visit (HOSPITAL_BASED_OUTPATIENT_CLINIC_OR_DEPARTMENT_OTHER): Payer: Federal, State, Local not specified - PPO

## 2017-06-27 ENCOUNTER — Ambulatory Visit: Payer: Federal, State, Local not specified - PPO

## 2017-06-27 ENCOUNTER — Telehealth: Payer: Self-pay | Admitting: Hematology

## 2017-06-27 ENCOUNTER — Ambulatory Visit (HOSPITAL_BASED_OUTPATIENT_CLINIC_OR_DEPARTMENT_OTHER): Payer: Federal, State, Local not specified - PPO

## 2017-06-27 VITALS — BP 115/58 | HR 71 | Temp 97.6°F | Resp 18 | Ht 60.0 in

## 2017-06-27 DIAGNOSIS — C787 Secondary malignant neoplasm of liver and intrahepatic bile duct: Secondary | ICD-10-CM

## 2017-06-27 DIAGNOSIS — D649 Anemia, unspecified: Secondary | ICD-10-CM | POA: Diagnosis not present

## 2017-06-27 DIAGNOSIS — D473 Essential (hemorrhagic) thrombocythemia: Secondary | ICD-10-CM

## 2017-06-27 DIAGNOSIS — C210 Malignant neoplasm of anus, unspecified: Secondary | ICD-10-CM | POA: Diagnosis not present

## 2017-06-27 DIAGNOSIS — R51 Headache: Secondary | ICD-10-CM

## 2017-06-27 DIAGNOSIS — Z95828 Presence of other vascular implants and grafts: Secondary | ICD-10-CM

## 2017-06-27 DIAGNOSIS — Z5112 Encounter for antineoplastic immunotherapy: Secondary | ICD-10-CM | POA: Diagnosis not present

## 2017-06-27 DIAGNOSIS — C772 Secondary and unspecified malignant neoplasm of intra-abdominal lymph nodes: Secondary | ICD-10-CM

## 2017-06-27 DIAGNOSIS — R112 Nausea with vomiting, unspecified: Secondary | ICD-10-CM

## 2017-06-27 DIAGNOSIS — F329 Major depressive disorder, single episode, unspecified: Secondary | ICD-10-CM

## 2017-06-27 DIAGNOSIS — G893 Neoplasm related pain (acute) (chronic): Secondary | ICD-10-CM

## 2017-06-27 DIAGNOSIS — K5900 Constipation, unspecified: Secondary | ICD-10-CM

## 2017-06-27 DIAGNOSIS — C801 Malignant (primary) neoplasm, unspecified: Secondary | ICD-10-CM

## 2017-06-27 DIAGNOSIS — I82409 Acute embolism and thrombosis of unspecified deep veins of unspecified lower extremity: Secondary | ICD-10-CM

## 2017-06-27 DIAGNOSIS — Z7189 Other specified counseling: Secondary | ICD-10-CM | POA: Diagnosis not present

## 2017-06-27 DIAGNOSIS — R110 Nausea: Secondary | ICD-10-CM

## 2017-06-27 DIAGNOSIS — G4700 Insomnia, unspecified: Secondary | ICD-10-CM

## 2017-06-27 LAB — CBC WITH DIFFERENTIAL/PLATELET
BASO%: 0.4 % (ref 0.0–2.0)
BASOS ABS: 0 10*3/uL (ref 0.0–0.1)
EOS ABS: 0.3 10*3/uL (ref 0.0–0.5)
EOS%: 5.7 % (ref 0.0–7.0)
HEMATOCRIT: 38.1 % (ref 34.8–46.6)
HEMOGLOBIN: 12.4 g/dL (ref 11.6–15.9)
LYMPH%: 23.4 % (ref 14.0–49.7)
MCH: 26.7 pg (ref 25.1–34.0)
MCHC: 32.7 g/dL (ref 31.5–36.0)
MCV: 81.8 fL (ref 79.5–101.0)
MONO#: 0.8 10*3/uL (ref 0.1–0.9)
MONO%: 13.8 % (ref 0.0–14.0)
NEUT#: 3.4 10*3/uL (ref 1.5–6.5)
NEUT%: 56.7 % (ref 38.4–76.8)
Platelets: 353 10*3/uL (ref 145–400)
RBC: 4.66 10*6/uL (ref 3.70–5.45)
RDW: 17.1 % — AB (ref 11.2–14.5)
WBC: 5.9 10*3/uL (ref 3.9–10.3)
lymph#: 1.4 10*3/uL (ref 0.9–3.3)

## 2017-06-27 LAB — COMPREHENSIVE METABOLIC PANEL
ALBUMIN: 3.6 g/dL (ref 3.5–5.0)
ALK PHOS: 98 U/L (ref 40–150)
ALT: 7 U/L (ref 0–55)
AST: 11 U/L (ref 5–34)
Anion Gap: 7 mEq/L (ref 3–11)
BUN: 7.4 mg/dL (ref 7.0–26.0)
CALCIUM: 9.4 mg/dL (ref 8.4–10.4)
CHLORIDE: 108 meq/L (ref 98–109)
CO2: 24 mEq/L (ref 22–29)
Creatinine: 0.6 mg/dL (ref 0.6–1.1)
Glucose: 98 mg/dl (ref 70–140)
POTASSIUM: 3.8 meq/L (ref 3.5–5.1)
SODIUM: 140 meq/L (ref 136–145)
Total Bilirubin: 0.23 mg/dL (ref 0.20–1.20)
Total Protein: 7.1 g/dL (ref 6.4–8.3)

## 2017-06-27 LAB — TSH: TSH: 1.406 m[IU]/L (ref 0.308–3.960)

## 2017-06-27 MED ORDER — HEPARIN SOD (PORK) LOCK FLUSH 100 UNIT/ML IV SOLN
500.0000 [IU] | Freq: Once | INTRAVENOUS | Status: AC | PRN
  Administered 2017-06-27: 500 [IU]
  Filled 2017-06-27: qty 5

## 2017-06-27 MED ORDER — METOCLOPRAMIDE HCL 10 MG PO TABS: 10.0000 mg | ORAL_TABLET | Freq: Three times a day (TID) | ORAL | 0 refills | Status: DC

## 2017-06-27 MED ORDER — NIVOLUMAB CHEMO INJECTION 100 MG/10ML
240.0000 mg | Freq: Once | INTRAVENOUS | Status: AC
  Administered 2017-06-27: 240 mg via INTRAVENOUS
  Filled 2017-06-27: qty 24

## 2017-06-27 MED ORDER — ACETAMINOPHEN-CODEINE #3 300-30 MG PO TABS: 1.0000 | ORAL_TABLET | ORAL | 0 refills | Status: DC | PRN

## 2017-06-27 MED ORDER — SODIUM CHLORIDE 0.9% FLUSH
10.0000 mL | INTRAVENOUS | Status: DC | PRN
  Administered 2017-06-27: 10 mL
  Filled 2017-06-27: qty 10

## 2017-06-27 MED ORDER — SODIUM CHLORIDE 0.9 % IV SOLN
Freq: Once | INTRAVENOUS | Status: AC
  Administered 2017-06-27: 15:00:00 via INTRAVENOUS

## 2017-06-27 MED ORDER — ONDANSETRON 8 MG PO TBDP: 8.0000 mg | ORAL_TABLET | Freq: Three times a day (TID) | ORAL | 2 refills | Status: DC | PRN

## 2017-06-27 MED ORDER — SODIUM CHLORIDE 0.9% FLUSH
10.0000 mL | Freq: Once | INTRAVENOUS | Status: AC
  Administered 2017-06-27: 10 mL
  Filled 2017-06-27: qty 10

## 2017-06-27 MED FILL — ONDANSETRON ODT 8 MG TABLET: 8 | 10 days supply | Qty: 30 | Fill #0

## 2017-06-27 NOTE — Telephone Encounter (Signed)
Scheduled appt per 7/6 los - Gave patient AVS and calender per los.  

## 2017-06-27 NOTE — Patient Instructions (Signed)
[facility] Discharge Instructions for Patients Receiving Chemotherapy  Today you received the following chemotherapy agents:  Nivolumab.  To help prevent nausea and vomiting after your treatment, we encourage you to take your nausea medication as directed.   If you develop nausea and vomiting that is not controlled by your nausea medication, call the clinic.   BELOW ARE SYMPTOMS THAT SHOULD BE REPORTED IMMEDIATELY:  *FEVER GREATER THAN 100.5 F  *CHILLS WITH OR WITHOUT FEVER  NAUSEA AND VOMITING THAT IS NOT CONTROLLED WITH YOUR NAUSEA MEDICATION  *UNUSUAL SHORTNESS OF BREATH  *UNUSUAL BRUISING OR BLEEDING  TENDERNESS IN MOUTH AND THROAT WITH OR WITHOUT PRESENCE OF ULCERS  *URINARY PROBLEMS  *BOWEL PROBLEMS  UNUSUAL RASH Items with * indicate a potential emergency and should be followed up as soon as possible.  Feel free to call the clinic you have any questions or concerns. The clinic phone number is [phone].  Please show the CHEMO ALERT CARD at check-in to the Emergency Department and triage nurse.   

## 2017-06-28 ENCOUNTER — Encounter: Payer: Self-pay | Admitting: Hematology

## 2017-07-07 NOTE — Progress Notes (Signed)
[place]  [phone] [phone]  Clinic Follow up Note   Patient Care Team: Damaris Hippo, MD as PCP - General (Family Medicine) 07/10/2017  SUMMARY OF ONCOLOGIC HISTORY: Oncology History   [id number] stage IIIB      Anal cancer ([place])   03/09/2015 Initial Diagnosis    Rectal cancer ([place])      04/19/2015 Imaging    PET scan showed hypermetabolic rectal lesion and inguinal lymph nodes      05/08/2015 - 07/13/2015 Radiation Therapy    59 Gy in 35 fractions to pelvis and inguinal LNs for her anal cancer. She tolerated treatment poorly with persistent nausea, vomiting, diarrhea and a poor appetite throughout treatment, and required several hospitalization and treatment delays. She did complete the planned treatment.       05/08/2015 - 06/2015 Chemotherapy    Concurrent chemotherapy with 5-FU and mitomycin along with radiation. Pt tolerated poorly.       01/08/2016 Imaging    PET scan showed you hypermetabolic left mediastinal 1L adenopathy (1.5cm), partial response to therapy with interval decrease in extent of hypermetabolic rectal soft tissue fullness as well as resolution of inguinal adenopathy.      02/06/2017 Procedure    Colonoscopy showed scar tissue just off anal verge, no lesion otherwise.      04/16/2017 - 04/23/2017 Hospital Admission    Admitted 04/16/17 - 04/23/17 for Nausea and Vomiting and abdominal pain. CT abd noted to have findings including rectal mass and enlarged nodes. Patient underwent sigmoidoscopy with biopsy showing benign findings. IR was consulted for node biopsy and which was done and awaiting results.      04/17/2017 Imaging    CT AP W Contrast 04/17/17: IMPRESSION: 1. Left posterior perirectal lobulated mass concerning for rectal malignancy. A smaller nodular density superior to this mass likely represent a mildly enlarged metastatic node. MRI is recommended for further evaluation. 2. Retroperitoneal adenopathy most  consistent with metastatic disease, likely from rectal neoplasm. A hypodense lesion to the left of the L5 posterior to the psoas muscle is also consistent with metastatic disease. 3. Multiple hepatic hypodense lesions, incompletely characterized, possibly metastatic. MRI is recommended for further evaluation. 4. No evidence of bowel obstruction or active inflammation. Normal appendix.      04/17/2017 Pathology Results    Diagnosis Rectum, biopsy, distal - BENIGN COLONIC MUCOSA WITH EROSIONS AND ULCERATION, SEE COMMENT. - NO DYSPLASIA OR MALIGNANCY.       04/22/2017 Pathology Results    Diagnosis Lymph node, needle/core biopsy, left retroperitoneum periaortic - POORLY DIFFERENTIATED SQUAMOUS CELL CARCINOMA - SEE COMMENT Microscopic Comment The needle core biopsy has a basaloid neoplasm with increased mitoses and necrosis. There is no residual lymph node parenchyma identified. The neoplasm is immunoreactive for cytokeratin 7 (patchy), cytokeratin 5/6, and p16 but negative for CDX2 and cytokeratin 20. Overall, the histology and immunophenotype support the diagnosis of a poorly differentiated squamous cell carcinoma.       05/13/2017 PET scan    IMPRESSION: 1. Perirectal adenopathy and posterior perirectal hypermetabolic mass, associated with presumed metastatic adenopathy in knee left common iliac, retroperitoneal, thoracic paraesophageal, and left upper mediastinal regions. The left upper mediastinal mass extends into the left supraclavicular space. 2. Metastatic disease to the liver (best seen in segment 3) along with a hypermetabolic nodule compatible with metastatic disease in the superior segment of the lower lobe of the left lung.      05/16/2017 -  Antibody Plan     Nivolumab  240mg  every 2 weeks started on 05/16/17       History of Present Illness (04/17/17) [name] is a 61 y.o. 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 I was called to see her in hospital.   She is from [place], with past medical history of DVT on Xarelto, rectal cancer status post chemotherapy and radiation in 2016, stroke, asthma, who was admitted to hospital last night for nausea, vomiting and abdominal pain. CT of abdomen and pelvis showed left posterior perirectal lobulated mass concerning for rectum malignancy. Retroperitoneal adenopathy, I hypodense lesion to the left L5 to the sore S muscle, suspicious for metastasis, and multiple hepatic hypodense lesions, indeterminate. I was called to see the patient to ruled out metastatic rectal cancer recurrence. I saw the patient before her endoscopy today.  The patient reports she has a prior history of rectal cancer stage 3, squamous cell, which was treated with chemotherapy and radiation therapy. She did not have surgery. The patient reports she completed treatment in September 2016. The patient has not used her port since 2016, though it is still in place. She reports the last time she followed up with physicians for her prior cancer, they told her she had suspicious masses in her lungs and liver.  The patient does not have any medical records from [place], where she previously lived and was treated, but reports her daughter would have them. She reports she does not know if she plans to stay in [place], or if she will return to [place].    Current Treatment:  Nivolumab 240mg  every 2 weeks started on 05/16/17   INTERVAL HISTORY:  [name] is here for a follow up. She presents to the clinic today with her daughter. She feels as if she is getting stronger although she gets gets very tired at times. She is not able to do some things at home herself. She is having less nausea. She has been trying to wean herself off opioids  is down to 2 hydrocodone pills daily. Next week she is going to dry 1 pill daily. She has been constipated, taking Miralax and Colace.  If she takes it out of the week she will get diarrhea and she likes to avoid that.   Per her daughter, she is now in PT and is expected to be walking with a cane in a month   REVIEW OF SYSTEMS:   Constitutional: Denies fevers, chills or abnormal weight loss  (+) fatigue (+) slight increase in energy Eyes: Denies blurriness of vision Ears, nose, mouth, throat, and face: Denies mucositis or sore throat Respiratory: Denies cough, dyspnea or wheezes Cardiovascular: Denies palpitation, chest discomfort or lower extremity swelling Gastrointestinal:  Denies heartburn  (+) nausea  Skin: Denies abnormal skin rashes Lymphatics: Denies new lymphadenopathy or easy bruising Neurological:Denies numbness, tingling or new weaknesses MSK: (+) occasional  hip pain from surgery Behavioral/Psych: Mood is stable, no new changes  All other systems were reviewed with the patient and are negative.  MEDICAL HISTORY:  Past Medical History:  Diagnosis Date  . Anxiety   . Arthritis   . Asthma    childhood   . Cancer [facility])    colorectal cancer with chemo and radiation   . DVT (deep venous thrombosis) ([facility])    left lower extremity  . Dyspnea    with anxiety and exertion  . GERD (gastroesophageal reflux disease)   . Neuromuscular disorder ([place])  poly neuropathy   . Stroke [facility]) 2010, 2011   TIA  x2    SURGICAL HISTORY: Past Surgical History:  Procedure Laterality Date  . CESAREAN SECTION    . FLEXIBLE SIGMOIDOSCOPY N/A 04/17/2017   Procedure: FLEXIBLE SIGMOIDOSCOPY;  Surgeon: Ronald Lobo, MD;  Location: [facility] ENDOSCOPY;  Service: Endoscopy;  Laterality: N/A;  . IR FLUORO GUIDE PORT INSERTION LEFT  06/04/2017  . IR REMOVAL TUN ACCESS W/ PORT W/O FL MOD SED  06/04/2017  . IR US GUIDE VASC ACCESS LEFT  06/04/2017  . IR VENO/EXT/UNI LEFT  06/04/2017  . THYROID CYST EXCISION    . TOTAL HIP ARTHROPLASTY Left 03/25/2017   Procedure: LEFT TOTAL HIP ARTHROPLASTY ANTERIOR APPROACH;  Surgeon: Paralee Cancel, MD;   Location: [facility] ORS;  Service: Orthopedics;  Laterality: Left;  Requests 70 mins    I have reviewed the social history and family history with the patient and they are unchanged from previous note.  ALLERGIES:  is allergic to ativan [lorazepam] and penicillins.  MEDICATIONS:  Current Outpatient Prescriptions  Medication Sig Dispense Refill  . acetaminophen-codeine (TYLENOL #3) 300-30 MG tablet Take 1 tablet by mouth every 4 (four) hours as needed for moderate pain. 30 tablet 0  . docusate sodium (COLACE) 100 MG capsule Take 1 capsule (100 mg total) by mouth 2 (two) times daily. 10 capsule 0  . dronabinol (MARINOL) 5 MG capsule Take 1 capsule (5 mg total) by mouth 2 (two) times daily before a meal. 60 capsule 0  . ferrous sulfate (FERROUSUL) 325 (65 FE) MG tablet Take 1 tablet (325 mg total) by mouth 3 (three) times daily with meals.    Marland Kitchen HYDROcodone-acetaminophen (NORCO) 7.5-325 MG tablet Take 1-2 tablets by mouth every 4 (four) hours as needed for moderate pain or severe pain. (Patient taking differently: Take 1-2 tablets by mouth every 12 (twelve) hours as needed for moderate pain or severe pain. ) 60 tablet 0  . LACTULOSE PO Take by mouth.    . lidocaine-prilocaine (EMLA) cream Apply 1 application topically as needed. 30 g 6  . methocarbamol (ROBAXIN) 500 MG tablet Take 1 tablet (500 mg total) by mouth every 6 (six) hours as needed for muscle spasms. 40 tablet 0  . metoCLOPramide (REGLAN) 10 MG tablet Take 1 tablet (10 mg total) by mouth 4 (four) times daily -  before meals and at bedtime. 120 tablet 0  . omeprazole (PRILOSEC) 20 MG capsule Take 20 mg by mouth daily.    . ondansetron (ZOFRAN ODT) 8 MG disintegrating tablet Take 1 tablet (8 mg total) by mouth every 8 (eight) hours as needed for nausea or vomiting. 30 tablet 2  . polyethylene glycol (MIRALAX / GLYCOLAX) packet Take 17 g by mouth 2 (two) times daily. 14 each 0  . rivaroxaban (XARELTO) 20 MG TABS tablet Take 1 tablet (20 mg total)  by mouth daily with supper. Please follow up with PCP to continue this medication. 30 tablet 0  . saccharomyces boulardii (FLORASTOR) 250 MG capsule Take 250 mg by mouth daily.    . traMADol (ULTRAM) 50 MG tablet Take 1 tablet (50 mg total) by mouth every 6 (six) hours as needed. 30 tablet 0   No current facility-administered medications for this visit.     PHYSICAL EXAMINATION:  ECOG PERFORMANCE STATUS: 2 BP 114/62 (BP Location: Right Arm, Patient Position: Sitting)   Pulse 63   Temp 98 F (36.7 C) (Oral)   Resp 18   Ht 5' (1.524 m)  Wt 145 lb 1.6 oz (65.8 kg)   SpO2 98%   BMI 28.34 kg/m   GENERAL:alert, moderately distress due to nausea and abdominal discomfort  SKIN: skin color, texture, turgor are normal, no rashes or significant lesions  EYES: normal, Conjunctiva are pink and non-injected, sclera clear OROPHARYNX:no exudate, no erythema and lips, buccal mucosa, and tongue normal  NECK: supple, thyroid normal size, non-tender, without nodularity LYMPH:  no palpable lymphadenopathy in the cervical, axillary or inguinal LUNGS: clear to auscultation and percussion with normal breathing effort HEART: regular rate & rhythm and no murmurs and no lower extremity edema ABDOMEN:abdomen soft, mild diffuse tenderness, normal bowel sounds Musculoskeletal:no cyanosis of digits and no clubbing  NEURO: alert & oriented x 3 with fluent speech, no focal motor/sensory deficits  LABORATORY DATA:  I have reviewed the data as listed CBC Latest Ref Rng & Units 07/10/2017 06/27/2017 06/04/2017  WBC 3.9 - 10.3 10e3/uL 5.3 5.9 5.5  Hemoglobin 11.6 - 15.9 g/dL 13.0 12.4 12.1  Hematocrit 34.8 - 46.6 % 39.1 38.1 36.4  Platelets 145 - 400 10e3/uL 286 353 393     CMP Latest Ref Rng & Units 07/10/2017 06/27/2017 05/30/2017  Glucose 70 - 140 mg/dl 117 98 99  BUN 7.0 - 26.0 mg/dL 8.5 7.4 7.1  Creatinine 0.6 - 1.1 mg/dL 0.7 0.6 0.6  Sodium 136 - 145 mEq/L 142 140 142  Potassium 3.5 - 5.1 mEq/L 3.8 3.8 4.0    Chloride 101 - 111 mmol/L - - -  CO2 22 - 29 mEq/L 26 24 27   Calcium 8.4 - 10.4 mg/dL 9.1 9.4 9.2  Total Protein 6.4 - 8.3 g/dL 7.2 7.1 6.9  Total Bilirubin 0.20 - 1.20 mg/dL 0.27 0.23 0.23  Alkaline Phos 40 - 150 U/L 93 98 98  AST 5 - 34 U/L 10 11 12   ALT 0-55 U/L U/L <6 7 <6   PATHOLOGY:   Pathology Results: 04/22/17 Diagnosis Lymph node, needle/core biopsy, left retroperitoneum periaortic - POORLY DIFFERENTIATED SQUAMOUS CELL CARCINOMA - SEE COMMENT Microscopic Comment The needle core biopsy has a basaloid neoplasm with increased mitoses and necrosis. There is no residual lymph node parenchyma identified. The neoplasm is immunoreactive for cytokeratin 7 (patchy), cytokeratin 5/6, and p16 but negative for CDX2 and cytokeratin 20. Overall, the histology and immunophenotype support the diagnosis of a poorly differentiated squamous cell carcinoma.  Surgical Pathology 04/17/2017 Diagnosis Rectum, biopsy, distal - BENIGN COLONIC MUCOSA WITH EROSIONS AND ULCERATION, SEE COMMENT. - NO DYSPLASIA OR MALIGNANCY. Microscopic Comment There is lamina propria fibrosis and macrophages consistent with prior treatment. The case was called to Dr. Cristina Gong on 04/18/2017.  RADIOGRAPHIC STUDIES: I have personally reviewed the radiological images as listed and agreed with the findings in the report. No results found.   PET Scan 05/13/2017 IMPRESSION: 1. Perirectal adenopathy and posterior perirectal hypermetabolic mass, associated with presumed metastatic adenopathy in knee left common iliac, retroperitoneal, thoracic paraesophageal, and left upper mediastinal regions. The left upper mediastinal mass extends into the left supraclavicular space. 2. Metastatic disease to the liver (best seen in segment 3) along with a hypermetabolic nodule compatible with metastatic disease in the superior segment of the lower lobe of the left lung.   CT AP W Contrast 04/17/17: IMPRESSION: 1. Left posterior  perirectal lobulated mass concerning for rectal malignancy. A smaller nodular density superior to this mass likely represent a mildly enlarged metastatic node. MRI is recommended for further evaluation. 2. 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  A hypodense lesion to the left of the L5 posterior to the psoas muscle is also consistent with metastatic disease. 3. Multiple hepatic hypodense lesions, incompletely characterized, possibly metastatic. MRI is recommended for further evaluation. 4. No evidence of bowel obstruction or active inflammation. Normal appendix.  ASSESSMENT & PLAN:  [name] is a 62 y.o. female from [place], with past medical history of DVT on Xarelto, rectal squamous cell carcinoma status post chemotherapy and radiation in 2016, stroke, asthma, who was admitted to hospital recently for nausea, vomiting and abdominal pain. She has had those symptoms since her recent hip surgery on 04/21/2017.   1. 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 -We reviewed her recent CT scan findings, and her retroperitoneal lymph node biopsy results in details with patient and her daughter. -Unfortunately, biopsy confirmed metastatic disease to abdominal lymph nodes. Her CT scan is also suspicious for liver and pelvic metastasis. She was not able to tolerate MRI to evaluate her liver lesions. -We discussed the incurable nature of her disease at this stage, and the goal of therapy is palliative. -I previously discussed her staging PET scan results, which showed metastasis in liver, left lung and diffuse nodal metastasis in chest and abdomen.  -She has started first-line therapy with Nivolumab,  tolerating well. -Labs reviewed and adequate to continue treatment.  -We will repeat scan after next treatment, I will request PET scan today -She has clinically doing better lately, lab reviewed, CBC and CMP are within normal limits. -  I strongly encouraged her to continue trying to wean herself off of hydrocodone. If needed, she can use tylenol #3 or Tramadol. She is aware to not take them at the same time. She can increase tylenol #3 to twice daily - I encouraged her to take Colace and do Miralax 4-5 days a week to help with constipation  2. Nausea, abdominal pain, secondary to constipation and metastatic cancer -Due to her sever nausea and low appetite, I have prescribed her Marinol 2.5mg  twice a day. -She'll continue Reglan, 3-4 a day and Zofran as needed  -overall improved latley   3. Headaches  -secondary to nausea  -when the headache is bad she sees bright light  -improved lately   4. Mild anemia and thrombocytosis -Likely secondary to her underlying metastatic cancer -Her previous iron study showed normal serum iron, decreased TIBC increased ferritin, this is consistent with anemia of chronic disease. -resolved now  -Continue monitoring.  5. Goal of care discussion  -We again discussed the incurable nature of her cancer, and the overall poor prognosis, especially if she does not have good response to chemotherapy or progress on chemo -The patient understands the goal of care is palliative. -I previously recommend DNR/DNI, she will think about it   6. Depression/anxiety and insomnia  -I suggest Mirtazapine, but she wishes to wait until after she is weaned off hydrocodone, she has been having a lot of anxiety during the weaning process of narcotics. -In the meantime I suggest sleep aid like melatonin over-the-counter  - I will refer to social worker  7. Left hip pain after hip replacement  -She underwent a total left hip replacement by Dr. Alvan Dame on 03/25/2017 -She is trying  to wean off hydrocodone, appears given her Tylenol No. 3 to substitute oxycodone, I will give  her a prescription of tramadol to wean her off narcotics  -she will continue physical therapy as outpatient, she is overall improving.  PLAN:  -  refill dissoluble Zofran tablets and Marinol 5mg  - prescribe Tramadol 30mg , try to wean her off narcotics  - PET scan in 3 weeks for restaging  -Labs reviewed. She is adequate for 4th dose Nivolimab today and will continue every 2 weeks  - refer to social worker for anxiety   No orders of the defined types were placed in this encounter.  All questions were answered. The patient knows to call the clinic with any problems, questions or concerns. No barriers to learning was detected.  I spent 25 minutes counseling the patient face to face. The total time spent in the appointment was 30 minutes and more than 50% was on counseling and review of test results This document serves as a record of services personally performed by Truitt Merle, MD. It was created on her behalf by Brandt Loosen, a trained medical scribe. The creation of this record is based on the scribe's personal observations and the provider's statements to them. This document has been checked and approved by the attending provider.

## 2017-07-10 ENCOUNTER — Encounter: Payer: Self-pay | Admitting: Hematology

## 2017-07-10 ENCOUNTER — Ambulatory Visit (HOSPITAL_BASED_OUTPATIENT_CLINIC_OR_DEPARTMENT_OTHER): Payer: Federal, State, Local not specified - PPO | Admitting: Hematology

## 2017-07-10 ENCOUNTER — Ambulatory Visit (HOSPITAL_BASED_OUTPATIENT_CLINIC_OR_DEPARTMENT_OTHER): Payer: Federal, State, Local not specified - PPO

## 2017-07-10 ENCOUNTER — Ambulatory Visit: Payer: Federal, State, Local not specified - PPO | Admitting: Nutrition

## 2017-07-10 ENCOUNTER — Other Ambulatory Visit (HOSPITAL_BASED_OUTPATIENT_CLINIC_OR_DEPARTMENT_OTHER): Payer: Federal, State, Local not specified - PPO

## 2017-07-10 ENCOUNTER — Ambulatory Visit: Payer: Federal, State, Local not specified - PPO

## 2017-07-10 VITALS — BP 114/62 | HR 63 | Temp 98.0°F | Resp 18 | Ht 60.0 in | Wt 145.1 lb

## 2017-07-10 DIAGNOSIS — C772 Secondary and unspecified malignant neoplasm of intra-abdominal lymph nodes: Secondary | ICD-10-CM

## 2017-07-10 DIAGNOSIS — D473 Essential (hemorrhagic) thrombocythemia: Secondary | ICD-10-CM

## 2017-07-10 DIAGNOSIS — F418 Other specified anxiety disorders: Secondary | ICD-10-CM | POA: Diagnosis not present

## 2017-07-10 DIAGNOSIS — R51 Headache: Secondary | ICD-10-CM

## 2017-07-10 DIAGNOSIS — I82409 Acute embolism and thrombosis of unspecified deep veins of unspecified lower extremity: Secondary | ICD-10-CM

## 2017-07-10 DIAGNOSIS — C787 Secondary malignant neoplasm of liver and intrahepatic bile duct: Secondary | ICD-10-CM

## 2017-07-10 DIAGNOSIS — Z5112 Encounter for antineoplastic immunotherapy: Secondary | ICD-10-CM | POA: Diagnosis not present

## 2017-07-10 DIAGNOSIS — C210 Malignant neoplasm of anus, unspecified: Secondary | ICD-10-CM

## 2017-07-10 DIAGNOSIS — G4700 Insomnia, unspecified: Secondary | ICD-10-CM | POA: Diagnosis not present

## 2017-07-10 DIAGNOSIS — D649 Anemia, unspecified: Secondary | ICD-10-CM | POA: Diagnosis not present

## 2017-07-10 DIAGNOSIS — R110 Nausea: Secondary | ICD-10-CM | POA: Diagnosis not present

## 2017-07-10 DIAGNOSIS — K5909 Other constipation: Secondary | ICD-10-CM | POA: Diagnosis not present

## 2017-07-10 DIAGNOSIS — C801 Malignant (primary) neoplasm, unspecified: Secondary | ICD-10-CM

## 2017-07-10 DIAGNOSIS — M25552 Pain in left hip: Secondary | ICD-10-CM

## 2017-07-10 DIAGNOSIS — D630 Anemia in neoplastic disease: Secondary | ICD-10-CM

## 2017-07-10 DIAGNOSIS — R112 Nausea with vomiting, unspecified: Secondary | ICD-10-CM

## 2017-07-10 DIAGNOSIS — Z95828 Presence of other vascular implants and grafts: Secondary | ICD-10-CM

## 2017-07-10 LAB — CBC WITH DIFFERENTIAL/PLATELET
BASO%: 0.4 % (ref 0.0–2.0)
BASOS ABS: 0 10*3/uL (ref 0.0–0.1)
EOS ABS: 0.3 10*3/uL (ref 0.0–0.5)
EOS%: 5.5 % (ref 0.0–7.0)
HEMATOCRIT: 39.1 % (ref 34.8–46.6)
HEMOGLOBIN: 13 g/dL (ref 11.6–15.9)
LYMPH#: 1.3 10*3/uL (ref 0.9–3.3)
LYMPH%: 24.5 % (ref 14.0–49.7)
MCH: 26.8 pg (ref 25.1–34.0)
MCHC: 33.2 g/dL (ref 31.5–36.0)
MCV: 80.6 fL (ref 79.5–101.0)
MONO#: 0.7 10*3/uL (ref 0.1–0.9)
MONO%: 12.7 % (ref 0.0–14.0)
NEUT%: 56.9 % (ref 38.4–76.8)
NEUTROS ABS: 3 10*3/uL (ref 1.5–6.5)
Platelets: 286 10*3/uL (ref 145–400)
RBC: 4.85 10*6/uL (ref 3.70–5.45)
RDW: 16.2 % — AB (ref 11.2–14.5)
WBC: 5.3 10*3/uL (ref 3.9–10.3)

## 2017-07-10 LAB — COMPREHENSIVE METABOLIC PANEL
ALBUMIN: 3.6 g/dL (ref 3.5–5.0)
ALK PHOS: 93 U/L (ref 40–150)
ALT: <6 U/L (ref 0–55)
AST: 10 U/L (ref 5–34)
Anion Gap: 8 mEq/L (ref 3–11)
BILIRUBIN TOTAL: 0.27 mg/dL (ref 0.20–1.20)
BUN: 8.5 mg/dL (ref 7.0–26.0)
CALCIUM: 9.1 mg/dL (ref 8.4–10.4)
CO2: 26 mEq/L (ref 22–29)
Chloride: 107 mEq/L (ref 98–109)
Creatinine: 0.7 mg/dL (ref 0.6–1.1)
EGFR: >90 mL/min/{1.73_m2} (ref >90)
GLUCOSE: 117 mg/dL (ref 70–140)
Potassium: 3.8 mEq/L (ref 3.5–5.1)
SODIUM: 142 meq/L (ref 136–145)
TOTAL PROTEIN: 7.2 g/dL (ref 6.4–8.3)

## 2017-07-10 MED ORDER — SODIUM CHLORIDE 0.9 % IV SOLN
240.0000 mg | Freq: Once | INTRAVENOUS | Status: AC
  Administered 2017-07-10: 240 mg via INTRAVENOUS
  Filled 2017-07-10: qty 24

## 2017-07-10 MED ORDER — TRAMADOL HCL 50 MG PO TABS: 50.0000 mg | ORAL_TABLET | Freq: Four times a day (QID) | ORAL | 0 refills | Status: DC | PRN

## 2017-07-10 MED ORDER — SODIUM CHLORIDE 0.9% FLUSH
10.0000 mL | INTRAVENOUS | Status: DC | PRN
  Administered 2017-07-10: 10 mL
  Filled 2017-07-10: qty 10

## 2017-07-10 MED ORDER — HEPARIN SOD (PORK) LOCK FLUSH 100 UNIT/ML IV SOLN
500.0000 [IU] | Freq: Once | INTRAVENOUS | Status: AC | PRN
  Administered 2017-07-10: 500 [IU]
  Filled 2017-07-10: qty 5

## 2017-07-10 MED ORDER — DRONABINOL 5 MG PO CAPS: 5.0000 mg | ORAL_CAPSULE | Freq: Two times a day (BID) | ORAL | 0 refills | Status: DC

## 2017-07-10 MED ORDER — SODIUM CHLORIDE 0.9% FLUSH
10.0000 mL | Freq: Once | INTRAVENOUS | Status: AC
  Administered 2017-07-10: 10 mL
  Filled 2017-07-10: qty 10

## 2017-07-10 MED ORDER — SODIUM CHLORIDE 0.9 % IV SOLN
Freq: Once | INTRAVENOUS | Status: AC
  Administered 2017-07-10: 15:00:00 via INTRAVENOUS

## 2017-07-10 NOTE — Patient Instructions (Signed)
[facility] Discharge Instructions for Patients Receiving Chemotherapy  Today you received the following chemotherapy agents Nivolumab  To help prevent nausea and vomiting after your treatment, we encourage you to take your nausea medication     If you develop nausea and vomiting that is not controlled by your nausea medication, call the clinic.   BELOW ARE SYMPTOMS THAT SHOULD BE REPORTED IMMEDIATELY:  *FEVER GREATER THAN 100.5 F  *CHILLS WITH OR WITHOUT FEVER  NAUSEA AND VOMITING THAT IS NOT CONTROLLED WITH YOUR NAUSEA MEDICATION  *UNUSUAL SHORTNESS OF BREATH  *UNUSUAL BRUISING OR BLEEDING  TENDERNESS IN MOUTH AND THROAT WITH OR WITHOUT PRESENCE OF ULCERS  *URINARY PROBLEMS  *BOWEL PROBLEMS  UNUSUAL RASH Items with * indicate a potential emergency and should be followed up as soon as possible.  Feel free to call the clinic you have any questions or concerns. The clinic phone number is [phone].  Please show the CHEMO ALERT CARD at check-in to the Emergency Department and triage nurse.   

## 2017-07-10 NOTE — Progress Notes (Signed)
61 year old female diagnosed with anal cancer in 2016.  She is a patient of Dr. Burr Medico.  Past medical history includes stroke, GERD, DVT, asthma, and anxiety.  Medications include Colace, Marinol, Reglan, Prilosec, Zofran, MiraLAX, Florostor.  Labs were reviewed.  Noted glucose 117, albumin 3.6.  Height: 5 feet 0 inches. Weight: 145.1 pounds July 19. Usual body weight: 144 pounds in April. BMI: 28.34.  Patient reports nausea and oral intake have improved. She reports constipation is a big problem. She is receiving physical therapy. Reports she used to be very active and eat a healthy diet but now she is limited in activity and must eat things that are easily available to her. She does not want to take too much constipation medication because she does not want to get diarrhea.  Nutrition diagnosis:  Food and nutrition related knowledge deficit related to diagnosis of anal cancer as evidenced by no prior need for nutrition related information.  Intervention: Educated patient to consume adequate calories and protein in small frequent meals throughout the day to promote weight maintenance. Provided detailed education for improving constipation. Recommended warm beverages in the morning and prune juice on a regular basis. Provided fact sheet on constipation and calories and protein. Questions were answered.  Teach back method used.  Contact information provided.  Monitoring, evaluation, goals: Patient will tolerate adequate calories and protein for weight maintenance.  Next visit: Wednesday, August 15, during infusion.  **Disclaimer: This note was dictated with voice recognition software. Similar sounding words can inadvertently be transcribed and this note may contain transcription errors which may not have been corrected upon publication of note.**

## 2017-07-11 ENCOUNTER — Other Ambulatory Visit: Payer: Federal, State, Local not specified - PPO

## 2017-07-11 ENCOUNTER — Ambulatory Visit: Payer: Federal, State, Local not specified - PPO

## 2017-07-11 ENCOUNTER — Ambulatory Visit: Payer: Federal, State, Local not specified - PPO | Admitting: Hematology

## 2017-07-13 ENCOUNTER — Encounter: Payer: Self-pay | Admitting: Hematology

## 2017-07-15 ENCOUNTER — Other Ambulatory Visit: Payer: Self-pay | Admitting: Hematology

## 2017-07-15 ENCOUNTER — Encounter: Payer: Self-pay | Admitting: *Deleted

## 2017-07-15 ENCOUNTER — Telehealth: Payer: Self-pay | Admitting: *Deleted

## 2017-07-15 MED ORDER — ACETAMINOPHEN-CODEINE #3 300-30 MG PO TABS: 1.0000 | ORAL_TABLET | Freq: Two times a day (BID) | ORAL | 0 refills | Status: DC | PRN

## 2017-07-15 MED FILL — traMADol HCL 50 MG TABS: 50 | 7 days supply | Qty: 30 | Fill #0

## 2017-07-15 MED FILL — ACETAMINOPHEN/COD #3 TABLET: 300-30 | 20 days supply | Qty: 40 | Fill #0

## 2017-07-15 MED FILL — DRONABINOL 5 MG CAPSULE: 5 | 30 days supply | Qty: 60 | Fill #0

## 2017-07-15 NOTE — Progress Notes (Signed)
[facility] Clinical Social Worker  Holiday representative received referral for support.  CSW contacted patient at home to offer support and assess for needs.  CSW left patient a message offering support and information on the support team and support programs at [facility].  CSW encouraged patient to call back with needs or concerns.    Johnnye Lana, MSW, LCSW, OSW-C Clinical Social Worker [facility] [phone]

## 2017-07-15 NOTE — Telephone Encounter (Signed)
Received call from daughter, [name] asking for refill on Tylenol # 3.  She states pt is trying to wean off of hydrocodone.  Informed that Dr Burr Medico out of office today & she is fine with tomorrow.  Pt is not out but [name] is going out of town & wants to make sure pt has enough to take care of her pain.  Message to Dr Burr Medico.

## 2017-07-15 NOTE — Telephone Encounter (Signed)
I have refilled her tylenol #3, will give the prescription to triage nurse.   Truitt Merle MD

## 2017-07-23 ENCOUNTER — Ambulatory Visit (HOSPITAL_BASED_OUTPATIENT_CLINIC_OR_DEPARTMENT_OTHER): Payer: Federal, State, Local not specified - PPO

## 2017-07-23 ENCOUNTER — Encounter: Payer: Self-pay | Admitting: Hematology

## 2017-07-23 ENCOUNTER — Other Ambulatory Visit (HOSPITAL_BASED_OUTPATIENT_CLINIC_OR_DEPARTMENT_OTHER): Payer: Federal, State, Local not specified - PPO

## 2017-07-23 ENCOUNTER — Ambulatory Visit (HOSPITAL_BASED_OUTPATIENT_CLINIC_OR_DEPARTMENT_OTHER): Payer: Federal, State, Local not specified - PPO | Admitting: Hematology

## 2017-07-23 ENCOUNTER — Ambulatory Visit: Payer: Federal, State, Local not specified - PPO

## 2017-07-23 VITALS — BP 128/59 | HR 65 | Temp 98.7°F | Resp 20 | Ht 60.0 in | Wt 146.8 lb

## 2017-07-23 DIAGNOSIS — C787 Secondary malignant neoplasm of liver and intrahepatic bile duct: Secondary | ICD-10-CM

## 2017-07-23 DIAGNOSIS — Z7189 Other specified counseling: Secondary | ICD-10-CM

## 2017-07-23 DIAGNOSIS — C801 Malignant (primary) neoplasm, unspecified: Secondary | ICD-10-CM

## 2017-07-23 DIAGNOSIS — G893 Neoplasm related pain (acute) (chronic): Secondary | ICD-10-CM

## 2017-07-23 DIAGNOSIS — Z5112 Encounter for antineoplastic immunotherapy: Secondary | ICD-10-CM | POA: Diagnosis not present

## 2017-07-23 DIAGNOSIS — M25552 Pain in left hip: Secondary | ICD-10-CM | POA: Diagnosis not present

## 2017-07-23 DIAGNOSIS — R110 Nausea: Secondary | ICD-10-CM

## 2017-07-23 DIAGNOSIS — C772 Secondary and unspecified malignant neoplasm of intra-abdominal lymph nodes: Secondary | ICD-10-CM

## 2017-07-23 DIAGNOSIS — F418 Other specified anxiety disorders: Secondary | ICD-10-CM

## 2017-07-23 DIAGNOSIS — C210 Malignant neoplasm of anus, unspecified: Secondary | ICD-10-CM

## 2017-07-23 DIAGNOSIS — G4700 Insomnia, unspecified: Secondary | ICD-10-CM | POA: Diagnosis not present

## 2017-07-23 DIAGNOSIS — I82409 Acute embolism and thrombosis of unspecified deep veins of unspecified lower extremity: Secondary | ICD-10-CM

## 2017-07-23 DIAGNOSIS — R112 Nausea with vomiting, unspecified: Secondary | ICD-10-CM

## 2017-07-23 DIAGNOSIS — Z95828 Presence of other vascular implants and grafts: Secondary | ICD-10-CM

## 2017-07-23 LAB — CBC WITH DIFFERENTIAL/PLATELET
BASO%: 0.4 % (ref 0.0–2.0)
Basophils Absolute: 0 10*3/uL (ref 0.0–0.1)
EOS%: 4 % (ref 0.0–7.0)
Eosinophils Absolute: 0.2 10*3/uL (ref 0.0–0.5)
HCT: 39.6 % (ref 34.8–46.6)
HGB: 12.9 g/dL (ref 11.6–15.9)
LYMPH%: 23.1 % (ref 14.0–49.7)
MCH: 26.5 pg (ref 25.1–34.0)
MCHC: 32.5 g/dL (ref 31.5–36.0)
MCV: 81.8 fL (ref 79.5–101.0)
MONO#: 0.7 10*3/uL (ref 0.1–0.9)
MONO%: 12.3 % (ref 0.0–14.0)
NEUT%: 60.2 % (ref 38.4–76.8)
NEUTROS ABS: 3.5 10*3/uL (ref 1.5–6.5)
Platelets: 331 10*3/uL (ref 145–400)
RBC: 4.85 10*6/uL (ref 3.70–5.45)
RDW: 17 % — AB (ref 11.2–14.5)
WBC: 5.8 10*3/uL (ref 3.9–10.3)
lymph#: 1.3 10*3/uL (ref 0.9–3.3)

## 2017-07-23 LAB — COMPREHENSIVE METABOLIC PANEL
AST: 11 U/L (ref 5–34)
Albumin: 3.5 g/dL (ref 3.5–5.0)
Alkaline Phosphatase: 101 U/L (ref 40–150)
Anion Gap: 7 mEq/L (ref 3–11)
BILIRUBIN TOTAL: 0.24 mg/dL (ref 0.20–1.20)
BUN: 9.6 mg/dL (ref 7.0–26.0)
CO2: 26 meq/L (ref 22–29)
CREATININE: 0.7 mg/dL (ref 0.6–1.1)
Calcium: 9.1 mg/dL (ref 8.4–10.4)
Chloride: 108 mEq/L (ref 98–109)
GLUCOSE: 109 mg/dL (ref 70–140)
Potassium: 3.9 mEq/L (ref 3.5–5.1)
SODIUM: 141 meq/L (ref 136–145)
TOTAL PROTEIN: 6.9 g/dL (ref 6.4–8.3)

## 2017-07-23 MED ORDER — HEPARIN SOD (PORK) LOCK FLUSH 100 UNIT/ML IV SOLN
500.0000 [IU] | Freq: Once | INTRAVENOUS | Status: DC
  Filled 2017-07-23: qty 5

## 2017-07-23 MED ORDER — ONDANSETRON 8 MG PO TBDP: 8.0000 mg | ORAL_TABLET | Freq: Three times a day (TID) | ORAL | 2 refills | Status: DC | PRN

## 2017-07-23 MED ORDER — SODIUM CHLORIDE 0.9% FLUSH
10.0000 mL | Freq: Once | INTRAVENOUS | Status: AC
  Administered 2017-07-23: 10 mL
  Filled 2017-07-23: qty 10

## 2017-07-23 MED ORDER — HEPARIN SOD (PORK) LOCK FLUSH 100 UNIT/ML IV SOLN
500.0000 [IU] | Freq: Once | INTRAVENOUS | Status: AC | PRN
  Administered 2017-07-23: 500 [IU]
  Filled 2017-07-23: qty 5

## 2017-07-23 MED ORDER — SODIUM CHLORIDE 0.9 % IV SOLN
240.0000 mg | Freq: Once | INTRAVENOUS | Status: AC
  Administered 2017-07-23: 240 mg via INTRAVENOUS
  Filled 2017-07-23: qty 24

## 2017-07-23 MED ORDER — SODIUM CHLORIDE 0.9% FLUSH
10.0000 mL | INTRAVENOUS | Status: DC | PRN
  Administered 2017-07-23: 10 mL
  Filled 2017-07-23: qty 10

## 2017-07-23 MED ORDER — SODIUM CHLORIDE 0.9 % IV SOLN
Freq: Once | INTRAVENOUS | Status: AC
  Administered 2017-07-23: 15:00:00 via INTRAVENOUS

## 2017-07-23 MED FILL — ONDANSETRON ODT 8 MG TABLET: 8 | 20 days supply | Qty: 60 | Fill #0

## 2017-07-23 NOTE — Progress Notes (Signed)
[place]  [phone] [phone]  Clinic Follow up Note   Patient Care Team: Damaris Hippo, MD as PCP - General (Family Medicine) 07/23/2017   SUMMARY OF ONCOLOGIC HISTORY: Oncology History   cT2N2M0 stage IIIB      Anal cancer ([place])   03/09/2015 Initial Diagnosis    Rectal cancer ([place])      04/19/2015 Imaging    PET scan showed hypermetabolic rectal lesion and inguinal lymph nodes      05/08/2015 - 07/13/2015 Radiation Therapy    59 Gy in 35 fractions to pelvis and inguinal LNs for her anal cancer. She tolerated treatment poorly with persistent nausea, vomiting, diarrhea and a poor appetite throughout treatment, and required several hospitalization and treatment delays. She did complete the planned treatment.       05/08/2015 - 06/2015 Chemotherapy    Concurrent chemotherapy with 5-FU and mitomycin along with radiation. Pt tolerated poorly.       01/08/2016 Imaging    PET scan showed you hypermetabolic left mediastinal 1L adenopathy (1.5cm), partial response to therapy with interval decrease in extent of hypermetabolic rectal soft tissue fullness as well as resolution of inguinal adenopathy.      02/06/2017 Procedure    Colonoscopy showed scar tissue just off anal verge, no lesion otherwise.      04/16/2017 - 04/23/2017 Hospital Admission    Admitted 04/16/17 - 04/23/17 for Nausea and Vomiting and abdominal pain. CT abd noted to have findings including rectal mass and enlarged nodes. Patient underwent sigmoidoscopy with biopsy showing benign findings. IR was consulted for node biopsy and which was done and awaiting results.      04/17/2017 Imaging    CT AP W Contrast 04/17/17: IMPRESSION: 1. Left posterior perirectal lobulated mass concerning for rectal malignancy. A smaller nodular density superior to this mass likely represent a mildly enlarged metastatic node. MRI is recommended for further evaluation. 2. Retroperitoneal adenopathy most  consistent with metastatic disease, likely from rectal neoplasm. A hypodense lesion to the left of the L5 posterior to the psoas muscle is also consistent with metastatic disease. 3. Multiple hepatic hypodense lesions, incompletely characterized, possibly metastatic. MRI is recommended for further evaluation. 4. No evidence of bowel obstruction or active inflammation. Normal appendix.      04/17/2017 Pathology Results    Diagnosis Rectum, biopsy, distal - BENIGN COLONIC MUCOSA WITH EROSIONS AND ULCERATION, SEE COMMENT. - NO DYSPLASIA OR MALIGNANCY.       04/22/2017 Pathology Results    Diagnosis Lymph node, needle/core biopsy, left retroperitoneum periaortic - POORLY DIFFERENTIATED SQUAMOUS CELL CARCINOMA - SEE COMMENT Microscopic Comment The needle core biopsy has a basaloid neoplasm with increased mitoses and necrosis. There is no residual lymph node parenchyma identified. The neoplasm is immunoreactive for cytokeratin 7 (patchy), cytokeratin 5/6, and p16 but negative for CDX2 and cytokeratin 20. Overall, the histology and immunophenotype support the diagnosis of a poorly differentiated squamous cell carcinoma.       05/13/2017 PET scan    IMPRESSION: 1. Perirectal adenopathy and posterior perirectal hypermetabolic mass, associated with presumed metastatic adenopathy in knee left common iliac, retroperitoneal, thoracic paraesophageal, and left upper mediastinal regions. The left upper mediastinal mass extends into the left supraclavicular space. 2. Metastatic disease to the liver (best seen in segment 3) along with a hypermetabolic nodule compatible with metastatic disease in the superior segment of the lower lobe of the left lung.      05/16/2017 -  Antibody Plan  Nivolumab 240mg  every 2 weeks started on 05/16/17       History of Present Illness (04/17/17) [name] is a 61 y.o. 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 I was called to see her in hospital.   She is from [place], with past medical history of DVT on Xarelto, rectal cancer status post chemotherapy and radiation in 2016, stroke, asthma, who was admitted to hospital last night for nausea, vomiting and abdominal pain. CT of abdomen and pelvis showed left posterior perirectal lobulated mass concerning for rectum malignancy. Retroperitoneal adenopathy, I hypodense lesion to the left L5 to the sore S muscle, suspicious for metastasis, and multiple hepatic hypodense lesions, indeterminate. I was called to see the patient to ruled out metastatic rectal cancer recurrence. I saw the patient before her endoscopy today.  The patient reports she has a prior history of rectal cancer stage 3, squamous cell, which was treated with chemotherapy and radiation therapy. She did not have surgery. The patient reports she completed treatment in September 2016. The patient has not used her port since 2016, though it is still in place. She reports the last time she followed up with physicians for her prior cancer, they told her she had suspicious masses in her lungs and liver.  The patient does not have any medical records from [place], where she previously lived and was treated, but reports her daughter would have them. She reports she does not know if she plans to stay in [place], or if she will return to [place].    Current Treatment:  Nivolumab 240mg  every 2 weeks started on 05/16/17   INTERVAL HISTORY:  [name] is here for a follow up. She presents to the clinic today with her daughter. She feels as if she is getting stronger overall. She doesn't feel like she's dying. She is going to the gym 2 times a week to walk. Her daughter wants to know if she can do aqua aerobics. Th pt she feels she does not have the balance for that. She is eating 3 times a day and she does get nauseous but takes her medication for it. She will take  tylenol but in the evening time. Her hip pain is overall improving but she will still be painful when it does occur. She uses her wheelchair at home.  She reports to her voice not letting her sing and her voice has changed. Her throat is not sore but hoarse. She feels tired but not sick. Her daughter wants her off tylenol #3 but the pt wants to know can she use it for pain as needed.  The patient wants to go home to [place], while her daughter wants her to stay where she can get better treatment. She rather her just visit.     REVIEW OF SYSTEMS:   Constitutional: Denies fevers, chills or abnormal weight loss  (+) fatigue (+) increase in energy Eyes: Denies blurriness of vision Ears, nose, mouth, throat, and face: Denies mucositis or sore throat (+) throat is hoarse  Respiratory: Denies cough, dyspnea or wheezes Cardiovascular: Denies palpitation, chest discomfort or lower extremity swelling Gastrointestinal:  Denies heartburn  (+) nausea (+) constipation/diarrhea Skin: Denies abnormal skin rashes Lymphatics: Denies new lymphadenopathy or easy bruising Neurological:Denies numbness, tingling or new weaknesses MSK: (+) occasional  hip pain from surgery Behavioral/Psych: Mood is stable, no new changes  All other systems were reviewed with the patient and are  negative.  MEDICAL HISTORY:  Past Medical History:  Diagnosis Date  . Anxiety   . Arthritis   . Asthma    childhood   . Cancer [facility])    colorectal cancer with chemo and radiation   . DVT (deep venous thrombosis) ([facility])    left lower extremity  . Dyspnea    with anxiety and exertion  . GERD (gastroesophageal reflux disease)   . Neuromuscular disorder ([facility])    poly neuropathy   . Stroke [facility]) 2010, 2011   TIA  x2    SURGICAL HISTORY: Past Surgical History:  Procedure Laterality Date  . CESAREAN SECTION    . FLEXIBLE SIGMOIDOSCOPY N/A 04/17/2017   Procedure: FLEXIBLE SIGMOIDOSCOPY;  Surgeon: Ronald Lobo, MD;  Location:  [facility];  Service: Endoscopy;  Laterality: N/A;  . IR FLUORO GUIDE PORT INSERTION LEFT  06/04/2017  . IR REMOVAL TUN ACCESS W/ PORT W/O FL MOD SED  06/04/2017  . IR US GUIDE VASC ACCESS LEFT  06/04/2017  . IR VENO/EXT/UNI LEFT  06/04/2017  . THYROID CYST EXCISION    . TOTAL HIP ARTHROPLASTY Left 03/25/2017   Procedure: LEFT TOTAL HIP ARTHROPLASTY ANTERIOR APPROACH;  Surgeon: Paralee Cancel, MD;  Location: [facility];  Service: Orthopedics;  Laterality: Left;  Requests 70 mins    I have reviewed the social history and family history with the patient and they are unchanged from previous note.  ALLERGIES:  is allergic to ativan [lorazepam] and penicillins.  MEDICATIONS:  Current Outpatient Prescriptions  Medication Sig Dispense Refill  . acetaminophen-codeine (TYLENOL #3) 300-30 MG tablet Take 1 tablet by mouth every 12 (twelve) hours as needed for moderate pain. 40 tablet 0  . docusate sodium (COLACE) 100 MG capsule Take 1 capsule (100 mg total) by mouth 2 (two) times daily. 10 capsule 0  . dronabinol (MARINOL) 5 MG capsule Take 1 capsule (5 mg total) by mouth 2 (two) times daily before a meal. 60 capsule 0  . lidocaine-prilocaine (EMLA) cream Apply 1 application topically as needed. 30 g 6  . methocarbamol (ROBAXIN) 500 MG tablet Take 1 tablet (500 mg total) by mouth every 6 (six) hours as needed for muscle spasms. 40 tablet 0  . metoCLOPramide (REGLAN) 10 MG tablet Take 1 tablet (10 mg total) by mouth 4 (four) times daily -  before meals and at bedtime. 120 tablet 0  . omeprazole (PRILOSEC) 20 MG capsule Take 20 mg by mouth daily.    . ondansetron (ZOFRAN ODT) 8 MG disintegrating tablet Take 1 tablet (8 mg total) by mouth every 8 (eight) hours as needed for nausea or vomiting. 60 tablet 2  . polyethylene glycol (MIRALAX / GLYCOLAX) packet Take 17 g by mouth 2 (two) times daily. 14 each 0  . rivaroxaban (XARELTO) 20 MG TABS tablet Take 1 tablet (20 mg total) by mouth daily with supper. Please  follow up with PCP to continue this medication. 30 tablet 0  . saccharomyces boulardii (FLORASTOR) 250 MG capsule Take 250 mg by mouth daily.    Marland Kitchen LACTULOSE PO Take by mouth.    . traMADol (ULTRAM) 50 MG tablet Take 1 tablet (50 mg total) by mouth every 6 (six) hours as needed. (Patient not taking: Reported on 07/23/2017) 30 tablet 0   No current facility-administered medications for this visit.     PHYSICAL EXAMINATION:  ECOG PERFORMANCE STATUS: 2 BP (!) 128/59 (BP Location: Left Arm, Patient Position: Sitting)   Pulse 65   Temp 98.7 F (37.1 C) (  Oral)   Resp 20   Ht 5' (1.524 m)   Wt 146 lb 12.8 oz (66.6 kg)   SpO2 100%   BMI 28.67 kg/m    GENERAL:alert, moderately distress due to nausea and abdominal discomfort  SKIN: skin color, texture, turgor are normal, no rashes or significant lesions  EYES: normal, Conjunctiva are pink and non-injected, sclera clear OROPHARYNX:no exudate, no erythema and lips, buccal mucosa, and tongue normal  NECK: supple, thyroid normal size, non-tender, without nodularity LYMPH:  no palpable lymphadenopathy in the cervical, axillary or inguinal LUNGS: clear to auscultation and percussion with normal breathing effort HEART: regular rate & rhythm and no murmurs and no lower extremity edema ABDOMEN:abdomen soft, mild diffuse tenderness, normal bowel sounds Musculoskeletal:no cyanosis of digits and no clubbing  NEURO: alert & oriented x 3 with fluent speech, no focal motor/sensory deficits  LABORATORY DATA:  I have reviewed the data as listed CBC Latest Ref Rng & Units 07/23/2017 07/10/2017 06/27/2017  WBC 3.9 - 10.3 10e3/uL 5.8 5.3 5.9  Hemoglobin 11.6 - 15.9 g/dL 12.9 13.0 12.4  Hematocrit 34.8 - 46.6 % 39.6 39.1 38.1  Platelets 145 - 400 10e3/uL 331 286 353     CMP Latest Ref Rng & Units 07/23/2017 07/10/2017 06/27/2017  Glucose 70 - 140 mg/dl 109 117 98  BUN 7.0 - 26.0 mg/dL 9.6 8.5 7.4  Creatinine 0.6 - 1.1 mg/dL 0.7 0.7 0.6  Sodium 136 - 145 mEq/L 141  142 140  Potassium 3.5 - 5.1 mEq/L 3.9 3.8 3.8  Chloride 101 - 111 mmol/L - - -  CO2 22 - 29 mEq/L 26 26 24   Calcium 8.4 - 10.4 mg/dL 9.1 9.1 9.4  Total Protein 6.4 - 8.3 g/dL 6.9 7.2 7.1  Total Bilirubin 0.20 - 1.20 mg/dL 0.24 0.27 0.23  Alkaline Phos 40 - 150 U/L 101 93 98  AST 5 - 34 U/L 11 10 11   ALT 0-55 U/L U/L <6 <6 7   PATHOLOGY:   Pathology Results: 04/22/17 Diagnosis Lymph node, needle/core biopsy, left retroperitoneum periaortic - POORLY DIFFERENTIATED SQUAMOUS CELL CARCINOMA - SEE COMMENT Microscopic Comment The needle core biopsy has a basaloid neoplasm with increased mitoses and necrosis. There is no residual lymph node parenchyma identified. The neoplasm is immunoreactive for cytokeratin 7 (patchy), cytokeratin 5/6, and p16 but negative for CDX2 and cytokeratin 20. Overall, the histology and immunophenotype support the diagnosis of a poorly differentiated squamous cell carcinoma.  Surgical Pathology 04/17/2017 Diagnosis Rectum, biopsy, distal - BENIGN COLONIC MUCOSA WITH EROSIONS AND ULCERATION, SEE COMMENT. - NO DYSPLASIA OR MALIGNANCY. Microscopic Comment There is lamina propria fibrosis and macrophages consistent with prior treatment. The case was called to Dr. Cristina Gong on 04/18/2017.  RADIOGRAPHIC STUDIES: I have personally reviewed the radiological images as listed and agreed with the findings in the report. No results found.   PET Scan 05/13/2017 IMPRESSION: 1. Perirectal adenopathy and posterior perirectal hypermetabolic mass, associated with presumed metastatic adenopathy in knee left common iliac, retroperitoneal, thoracic paraesophageal, and left upper mediastinal regions. The left upper mediastinal mass extends into the left supraclavicular space. 2. Metastatic disease to the liver (best seen in segment 3) along with a hypermetabolic nodule compatible with metastatic disease in the superior segment of the lower lobe of the left lung.   CT AP W  Contrast 04/17/17: IMPRESSION: 1. Left posterior perirectal lobulated mass concerning for rectal malignancy. A smaller nodular density superior to this mass likely represent a mildly enlarged metastatic node. MRI is recommended for further  evaluation. 2. 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 A hypodense lesion to the left of the L5 posterior to the psoas muscle is also consistent with metastatic disease. 3. Multiple hepatic hypodense lesions, incompletely characterized, possibly metastatic. MRI is recommended for further evaluation. 4. No evidence of bowel obstruction or active inflammation. Normal appendix.  ASSESSMENT & PLAN:  [name] is a 61 y.o. female from [place], with past medical history of DVT on Xarelto, rectal squamous cell carcinoma status post chemotherapy and radiation in 2016, stroke, asthma, who was admitted to hospital recently for nausea, vomiting and abdominal pain. She has had those symptoms since her recent hip surgery on 04/21/2017.   1. 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 -We reviewed her recent CT scan findings, and her retroperitoneal lymph node biopsy results in details with patient and her daughter. -Unfortunately, biopsy confirmed metastatic disease to abdominal lymph nodes. Her CT scan is also suspicious for liver and pelvic metastasis. She was not able to tolerate MRI to evaluate her liver lesions. -We discussed the incurable nature of her disease at this stage, and the goal of therapy is palliative. -I previously discussed her staging PET scan results, which showed metastasis in liver, left lung and diffuse nodal metastasis in chest and abdomen.  -She  has started first-line therapy with Nivolumab, tolerating well. -She has successfully weaned off hydrocodone. She is on tylenol #3 twice daily, I encouraged her to continue weaning, she can use tramadol as needed. - I encouraged her to take Colace and do Miralax 4-5 days a week to help with constipation -She has clinically doing better lately, lab reviewed, CBC and CMP are within normal limits. We'll proceed with Nivolumab today and continue every 2 weeks -Ordered PET scan to be done before next cycle within two weeks.    2. Nausea, abdominal pain, secondary to constipation and metastatic cancer -Due to her sever nausea and low appetite, I have prescribed her Marinol 2.5mg  twice a day. -She'll continue Reglan, 3-4 a day and Zofran as needed  -overall much improved latley   3. Headaches  -secondary to nausea  -when the headache is bad she sees bright light  -much improved lately   4. Mild anemia and thrombocytosis -Likely secondary to her underlying metastatic cancer -Her previous iron study showed normal serum iron, decreased TIBC increased ferritin, this is consistent with anemia of chronic disease. -resolved now  -Continue monitoring.  5. Goal of care discussion  -We again discussed the incurable nature of her cancer, and the overall poor prognosis, especially if she does not have good response to chemotherapy or progress on chemo -The patient understands the goal of care is palliative. -I previously recommend DNR/DNI, she will think about it   6. Depression/anxiety and insomnia  -I suggest Mirtazapine, but she wishes to wait until after she is weaned off hydrocodone, she has been having a lot of anxiety during the weaning process of narcotics. -In the meantime I suggest sleep aid like melatonin over-the-counter  - I will refer to social worker  7. Left hip pain after hip replacement  -She underwent a total left hip replacement by Dr. Alvan Dame on 03/25/2017 -She has weaned off  hydrocodone, on Tylenol No. 3 twice daily now -she will continue physical therapy as outpatient,  she is overall improving. -I suggest the pt reduce Tylenol #3 by  half tablets to  replace with tramadol, starting with the morning dose.    PLAN:  -refill dissoluble Zofran tablets - PET scan in 2 weeks for restaging  -Labs reviewed. She is adequate for Nivolimab today and will continue today and every 2 weeks  -F/u in 2 weeks     Orders Placed This Encounter  Procedures  . NM PET Image Restag (PS) Skull Base To Thigh    Standing Status:   Future    Standing Expiration Date:   07/23/2018    Order Specific Question:   Reason for Exam (SYMPTOM  OR DIAGNOSIS REQUIRED)    Answer:   restaging    Order Specific Question:   If indicated for the ordered procedure, I authorize the administration of a radiopharmaceutical per Radiology protocol    Answer:   Yes    Order Specific Question:   Is the patient pregnant?    Answer:   No    Order Specific Question:   Preferred imaging location?    Answer:   [facility]    Order Specific Question:   Radiology Contrast Protocol - do NOT remove file path    Answer:   [email or web address]   All questions were answered. The patient knows to call the clinic with any problems, questions or concerns. No barriers to learning was detected.  I spent 25 minutes counseling the patient face to face. The total time spent in the appointment was 30 minutes and more than 50% was on counseling and review of test results  This document serves as a record of services personally performed by Truitt Merle, MD. It was created on her behalf by Joslyn Devon, a trained medical scribe. The creation of this record is based on the scribe's personal observations and the provider's statements to them. This document has been checked and approved by the attending provider.

## 2017-07-23 NOTE — Patient Instructions (Signed)
[facility] Discharge Instructions for Patients Receiving Chemotherapy  Today you received the following chemotherapy agents Nivolumab  To help prevent nausea and vomiting after your treatment, we encourage you to take your nausea medication     If you develop nausea and vomiting that is not controlled by your nausea medication, call the clinic.   BELOW ARE SYMPTOMS THAT SHOULD BE REPORTED IMMEDIATELY:  *FEVER GREATER THAN 100.5 F  *CHILLS WITH OR WITHOUT FEVER  NAUSEA AND VOMITING THAT IS NOT CONTROLLED WITH YOUR NAUSEA MEDICATION  *UNUSUAL SHORTNESS OF BREATH  *UNUSUAL BRUISING OR BLEEDING  TENDERNESS IN MOUTH AND THROAT WITH OR WITHOUT PRESENCE OF ULCERS  *URINARY PROBLEMS  *BOWEL PROBLEMS  UNUSUAL RASH Items with * indicate a potential emergency and should be followed up as soon as possible.  Feel free to call the clinic you have any questions or concerns. The clinic phone number is [phone].  Please show the CHEMO ALERT CARD at check-in to the Emergency Department and triage nurse.   

## 2017-07-24 LAB — TSH: TSH: 1.909 m(IU)/L (ref 0.308–3.960)

## 2017-07-25 ENCOUNTER — Telehealth: Payer: Self-pay

## 2017-07-25 NOTE — Telephone Encounter (Signed)
Pt is asking if it is OK to get a relaxation massage at [facility] this weekend? If it is OK, should the therapist avoid the leg with hx of clot?

## 2017-07-25 NOTE — Telephone Encounter (Signed)
OK to get massage. Due to her recent hip surgery and leg DVT, please avoid the legs, thanks   Truitt Merle MD

## 2017-07-25 NOTE — Telephone Encounter (Signed)
lvm with [name] per Dr Burr Medico attached message.

## 2017-07-26 ENCOUNTER — Emergency Department (HOSPITAL_COMMUNITY)
Admission: EM | Admit: 2017-07-26 | Discharge: 2017-07-26 | Disposition: A | Discharge status: Home / Self Care | Payer: Federal, State, Local not specified - PPO | Attending: Emergency Medicine | Admitting: Emergency Medicine

## 2017-07-26 ENCOUNTER — Other Ambulatory Visit: Payer: Self-pay

## 2017-07-26 ENCOUNTER — Encounter (HOSPITAL_COMMUNITY): Payer: Self-pay

## 2017-07-26 ENCOUNTER — Emergency Department (HOSPITAL_COMMUNITY): Payer: Federal, State, Local not specified - PPO

## 2017-07-26 DIAGNOSIS — Z79899 Other long term (current) drug therapy: Secondary | ICD-10-CM | POA: Insufficient documentation

## 2017-07-26 DIAGNOSIS — Z96642 Presence of left artificial hip joint: Secondary | ICD-10-CM | POA: Diagnosis not present

## 2017-07-26 DIAGNOSIS — R0602 Shortness of breath: Secondary | ICD-10-CM

## 2017-07-26 DIAGNOSIS — F17210 Nicotine dependence, cigarettes, uncomplicated: Secondary | ICD-10-CM | POA: Insufficient documentation

## 2017-07-26 LAB — BASIC METABOLIC PANEL
Anion gap: 7 (ref 5–15)
BUN: 9 mg/dL (ref 6–20)
CO2: 23 mmol/L (ref 22–32)
Calcium: 7.7 mg/dL — ABNORMAL LOW (ref 8.9–10.3)
Chloride: 111 mmol/L (ref 101–111)
Creatinine, Ser: 0.45 mg/dL (ref 0.44–1.00)
GFR calc Af Amer: >60 mL/min (ref >60)
GFR calc non Af Amer: >60 mL/min (ref >60)
Glucose, Bld: 80 mg/dL (ref 65–99)
Potassium: 3.1 mmol/L — ABNORMAL LOW (ref 3.5–5.1)
Sodium: 141 mmol/L (ref 135–145)

## 2017-07-26 LAB — CBC WITH DIFFERENTIAL/PLATELET
Basophils Absolute: 0 10*3/uL (ref 0.0–0.1)
Basophils Relative: 0 %
Eosinophils Absolute: 0.1 10*3/uL (ref 0.0–0.7)
Eosinophils Relative: 2 %
HCT: 35 % — ABNORMAL LOW (ref 36.0–46.0)
Hemoglobin: 12 g/dL (ref 12.0–15.0)
Lymphocytes Relative: 20 %
Lymphs Abs: 1.3 10*3/uL (ref 0.7–4.0)
MCH: 26.5 pg (ref 26.0–34.0)
MCHC: 34.3 g/dL (ref 30.0–36.0)
MCV: 77.3 fL — ABNORMAL LOW (ref 78.0–100.0)
Monocytes Absolute: 0.6 10*3/uL (ref 0.1–1.0)
Monocytes Relative: 9 %
Neutro Abs: 4.3 10*3/uL (ref 1.7–7.7)
Neutrophils Relative %: 69 %
Platelets: 300 10*3/uL (ref 150–400)
RBC: 4.53 MIL/uL (ref 3.87–5.11)
RDW: 15.9 % — ABNORMAL HIGH (ref 11.5–15.5)
WBC: 6.2 10*3/uL (ref 4.0–10.5)

## 2017-07-26 LAB — POCT I-STAT TROPONIN I: Troponin i, poc: 0 ng/mL (ref 0.00–0.08)

## 2017-07-26 MED ORDER — ALBUTEROL SULFATE HFA 108 (90 BASE) MCG/ACT IN AERS
1.0000 | INHALATION_SPRAY | Freq: Four times a day (QID) | RESPIRATORY_TRACT | Status: DC | PRN
  Administered 2017-07-26: 1 via RESPIRATORY_TRACT
  Filled 2017-07-26: qty 6.7

## 2017-07-26 MED ORDER — POTASSIUM CHLORIDE CRYS ER 20 MEQ PO TBCR
EXTENDED_RELEASE_TABLET | ORAL | Status: AC
  Filled 2017-07-26: qty 2

## 2017-07-26 MED ORDER — HEPARIN SOD (PORK) LOCK FLUSH 100 UNIT/ML IV SOLN
500.0000 [IU] | Freq: Once | INTRAVENOUS | Status: AC
  Administered 2017-07-26: 500 [IU]
  Filled 2017-07-26: qty 5

## 2017-07-26 MED ORDER — POTASSIUM CHLORIDE CRYS ER 20 MEQ PO TBCR
40.0000 meq | EXTENDED_RELEASE_TABLET | Freq: Once | ORAL | Status: AC
  Administered 2017-07-26: 40 meq via ORAL

## 2017-07-26 MED ORDER — IPRATROPIUM-ALBUTEROL 0.5-2.5 (3) MG/3ML IN SOLN
3.0000 mL | Freq: Once | RESPIRATORY_TRACT | Status: AC
Start: 2017-07-26 — End: 2017-07-26
  Administered 2017-07-26: 3 mL via RESPIRATORY_TRACT
  Filled 2017-07-26: qty 3

## 2017-07-26 MED ORDER — ALBUTEROL SULFATE (2.5 MG/3ML) 0.083% IN NEBU
5.0000 mg | INHALATION_SOLUTION | Freq: Once | RESPIRATORY_TRACT | Status: DC
  Filled 2017-07-26: qty 6

## 2017-07-26 MED ORDER — AEROCHAMBER PLUS FLO-VU MEDIUM MISC
1.0000 | Freq: Once | Status: AC
  Administered 2017-07-26: 1
  Filled 2017-07-26: qty 1

## 2017-07-26 NOTE — ED Notes (Signed)
BLOOD DRAW ATTEMPTED X2 UNSUCCESSFUL

## 2017-07-26 NOTE — ED Notes (Signed)
Pt ambulated out to the hall and back. Pt O2 was 100 there and back.

## 2017-07-26 NOTE — ED Provider Notes (Signed)
[facility] DEPT Provider Note   CSN: [id number] Arrival date & time: 07/26/17  1012     History   Chief Complaint Chief Complaint  Patient presents with  . Shortness of Breath    HPI [name] is a 61 y.o. female with history of colorectal cancer, DVT of the left lower extremity, GERD, TIA, polyneuropathy, and anxiety who presents today with chief complaint acute onset, constant shortness of breath which occurred 2 hours prior to arrival. She states that she was sitting at rest when shortness of breath began, worsens with activity, relieved somewhat with rest. She endorses a feeling of chest "fullness "and tightness but denies any chest pain. No recent travel or surgeries, no hemoptysis, and she is a former smoker who quit over 20 years ago. She has not tried anything for her symptoms. She recently had an infusion of chemotherapy medication 3 days ago and has been feeling fatigued since then which she states is typical for her.She states she does feel that her shortness of breath is likely related to anxiety which she has experienced in the past.  The history is provided by the patient.    Past Medical History:  Diagnosis Date  . Anxiety   . Arthritis   . Asthma    childhood   . Cancer [facility])    colorectal cancer with chemo and radiation   . DVT (deep venous thrombosis) (HCC)    left lower extremity  . Dyspnea    with anxiety and exertion  . GERD (gastroesophageal reflux disease)   . Neuromuscular disorder (HCC)    poly neuropathy   . Stroke [facility]) 2010, 2011   TIA  x2    Patient Active Problem List   Diagnosis Date Noted  . Port catheter in place 05/23/2017  . 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 Goals of care, counseling/discussion 05/01/2017  . Cancer ([place])   . Nausea & vomiting 04/17/2017  . Abdominal pain 04/17/2017  . Anal cancer ([place]) 04/17/2017  . Rectal bleeding 04/17/2017  . Acute encephalopathy 04/17/2017  . DVT (deep venous thrombosis)  ([place])   . Asthma   . GERD (gastroesophageal reflux disease)   . 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 CESAREAN SECTION    . FLEXIBLE SIGMOIDOSCOPY N/A 04/17/2017   Procedure: FLEXIBLE SIGMOIDOSCOPY;  Surgeon: Ronald Lobo, MD;  Location: [facility] ENDOSCOPY;  Service: Endoscopy;  Laterality: N/A;  . IR FLUORO GUIDE PORT INSERTION LEFT  06/04/2017  . IR REMOVAL TUN ACCESS W/ PORT W/O FL MOD SED  06/04/2017  . IR US GUIDE VASC ACCESS LEFT  06/04/2017  . IR VENO/EXT/UNI LEFT  06/04/2017  . THYROID CYST EXCISION    . TOTAL HIP ARTHROPLASTY Left 03/25/2017   Procedure: LEFT TOTAL HIP ARTHROPLASTY ANTERIOR APPROACH;  Surgeon: Paralee Cancel, MD;  Location: [facility] ORS;  Service: Orthopedics;  Laterality: Left;  Requests 70 mins    OB History    No data available       Home Medications    Prior to Admission medications   Medication Sig Start Date End Date Taking? Authorizing Provider  acetaminophen-codeine (TYLENOL #3) 300-30 MG tablet Take 1 tablet by mouth every 12 (twelve) hours as needed for moderate pain. 07/15/17   Truitt Merle, MD  docusate sodium (COLACE) 100 MG capsule Take 1 capsule (100 mg total) by mouth 2 (two) times daily. 03/25/17   Danae Orleans, PA-C  dronabinol (MARINOL) 5 MG capsule Take  1 capsule (5 mg total) by mouth 2 (two) times daily before a meal. 07/10/17   Truitt Merle, MD  LACTULOSE PO Take by mouth.    [provider]  lidocaine-prilocaine (EMLA) cream Apply 1 application topically as needed. 05/16/17   Truitt Merle, MD  methocarbamol (ROBAXIN) 500 MG tablet Take 1 tablet (500 mg total) by mouth every 6 (six) hours as needed for muscle spasms. 03/25/17   Danae Orleans, PA-C  metoCLOPramide (REGLAN) 10 MG tablet Take 1 tablet (10 mg total) by mouth 4 (four) times daily -  before meals and at bedtime. 06/27/17   Truitt Merle, MD  omeprazole (PRILOSEC) 20 MG capsule Take 20 mg by mouth daily.    [provider]  ondansetron (ZOFRAN  ODT) 8 MG disintegrating tablet Take 1 tablet (8 mg total) by mouth every 8 (eight) hours as needed for nausea or vomiting. 07/23/17   Truitt Merle, MD  polyethylene glycol [facility] Floria Raveling) packet Take 17 g by mouth 2 (two) times daily. 03/25/17   Danae Orleans, PA-C  rivaroxaban (XARELTO) 20 MG TABS tablet Take 1 tablet (20 mg total) by mouth daily with supper. Please follow up with PCP to continue this medication. 03/27/17   Danae Orleans, PA-C  saccharomyces boulardii (FLORASTOR) 250 MG capsule Take 250 mg by mouth daily.    [provider]  traMADol (ULTRAM) 50 MG tablet Take 1 tablet (50 mg total) by mouth every 6 (six) hours as needed. Patient not taking: Reported on 07/23/2017 07/10/17   Truitt Merle, MD    Family History Family History  Problem Relation Age of Onset  . Hypertension Mother   . Dementia Mother     Social History Social History  Substance Use Topics  . Smoking status: Former Smoker    Types: Cigarettes  . Smokeless tobacco: Never Used     Comment: 20 years ago   . Alcohol use No     Allergies   Ativan [lorazepam] and Penicillins   Review of Systems Review of Systems   Physical Exam Updated Vital Signs BP 122/80 (BP Location: Right Arm)   Pulse 98   Temp 98.4 F (36.9 C) (Oral)   Resp 18   SpO2 100%   Physical Exam  Constitutional: She appears well-developed and well-nourished. No distress.  HENT:  Head: Normocephalic and atraumatic.  Eyes: Conjunctivae are normal. Right eye exhibits no discharge. Left eye exhibits no discharge.  Neck: Normal range of motion. Neck supple. No JVD present. No tracheal deviation present.  Cardiovascular: Normal rate, regular rhythm, normal heart sounds and intact distal pulses.   Fluent speech, no facial droop, sensation intact globally, normal gait, and patient able to heel walk and toe walk without difficulty.   Pulmonary/Chest: She has rales. She exhibits no tenderness.  Speaking in short sentences. No  intercostal retractions. Equal rise and fall of chest. Left basilar expiratory wheezes heard on auscultation. Otherwise generally hypoactive breath sounds. Port is in place midsternally without surrounding induration, erythema, tenderness, or drainage.  Abdominal: Soft. Bowel sounds are normal. She exhibits no distension. There is no tenderness.  Musculoskeletal: She exhibits no edema.  Neurological: She is alert.  Skin: Skin is warm and dry. She is not diaphoretic.  Psychiatric: She has a normal mood and affect. Her behavior is normal.     ED Treatments / Results  Labs (all labs ordered are listed, but only abnormal results are displayed) Labs Reviewed  CBC WITH DIFFERENTIAL/PLATELET - Abnormal; Notable for the following:  Result Value   HCT 35.0 (*)    MCV 77.3 (*)    RDW 15.9 (*)    All other components within normal limits  BASIC METABOLIC PANEL - Abnormal; Notable for the following:    Potassium 3.1 (*)    Calcium 7.7 (*)    All other components within normal limits  I-STAT TROPONIN, ED  POCT I-STAT TROPONIN I    EKG  EKG Interpretation  Date/Time:  Saturday July 26 2017 10:26:17 EDT Ventricular Rate:  101 PR Interval:    QRS Duration: 93 QT Interval:  328 QTC Calculation: 426 R Axis:   95 Text Interpretation:  Sinus tachycardia Right axis deviation Low voltage, precordial leads Confirmed by Veryl Speak [phone]) on 07/26/2017 2:37:41 PM       Radiology Dg Chest 2 View  Result Date: 07/26/2017 CLINICAL DATA:  New onset of shortness of breath. History of anal cancer. EXAM: CHEST  2 VIEW COMPARISON:  PET-CT 05/13/2017 FINDINGS: Left jugular Port-A-Cath is present. Catheter tip in the lower SVC. Both lungs are clear. No focal airspace disease or pulmonary edema. Negative for a pneumothorax. No large pleural effusions. Bridging osteophytes in the thoracic spine. IMPRESSION: No active cardiopulmonary disease. Electronically Signed   By: Markus Daft M.D.   On:  07/26/2017 10:43    Procedures Procedures (including critical care time)  Medications Ordered in ED Medications  potassium chloride SA (K-DUR,KLOR-CON) 20 MEQ CR tablet (  Not Given 07/26/17 1325)  albuterol (PROVENTIL HFA;VENTOLIN HFA) 108 (90 Base) MCG/ACT inhaler 1 puff (1 puff Inhalation Given 07/26/17 1401)  ipratropium-albuterol (DUONEB) 0.5-2.5 (3) MG/3ML nebulizer solution 3 mL (3 mLs Nebulization Given 07/26/17 1251)  potassium chloride SA (K-DUR,KLOR-CON) CR tablet 40 mEq (40 mEq Oral Given 07/26/17 1251)  AEROCHAMBER PLUS FLO-VU MEDIUM MISC 1 each (1 each Other Given 07/26/17 1412)  heparin lock flush 100 unit/mL (500 Units Intracatheter Given 07/26/17 1412)     Initial Impression / Assessment and Plan / ED Course  I have reviewed the triage vital signs and the nursing notes.  Pertinent labs & imaging results that were available during my care of the patient were reviewed by me and considered in my medical decision making (see chart for details).     Patient with history of colon cancer presents today with complaint of shortness of breath. Afebrile, vital signs are stable, SPO2 100% on room air. Chest x-ray shows no active cardiopulmonary disease. No evidence of pneumonia or pulmonary edema. Doubt PE with 100% oxygen saturation on a patient who is anticoagulated. Troponin is negative and EKG shows no evidence of ST elevation. Doubt ACS or MI as cause of symptoms. Doubt pericarditis with no EKG changes and in the absence of chest pain. BMP shows slight hypokalemia at 3.1, replenished while in the ED. Otherwise no significant electrolyte abnormalities. CBC shows no leukocytosis, leukopenia, or anemia. Patient ambulated with oxygen saturations remaining at 100% on room air. Fetal nebulizer administered, on re-auscultation of the lungs wheezing has resolved. Patient states she is feeling better after the administration of nebulizer. She is stable for discharge home with albuterol inhaler as needed  and follow-up with primary care for reevaluation. Discussed indications for return to the ED. Pt seen and evaluated by Dr. Stark Jock who agrees with assessment and plan. Pt verbalized understanding of and agreement with plan and is safe for discharge home at this time.  Final Clinical Impressions(s) / ED Diagnoses   Final diagnoses:  SOB (shortness of breath)  New Prescriptions Discharge Medication List as of 07/26/2017  1:51 PM       Renita Papa, PA-C 07/26/17 1439    Veryl Speak, MD 07/26/17 [phone]

## 2017-07-26 NOTE — ED Notes (Signed)
Will access port

## 2017-07-26 NOTE — ED Triage Notes (Signed)
Pt with shortness of breath starting today.  Tightness with taking deep breaths.  No cough.  No chest pain

## 2017-07-26 NOTE — Discharge Instructions (Signed)
You may use the albuterol inhaler as needed for shortness of breath up to 4 times daily. You may also use your reflux medication at home to see if this further improved her symptoms. Follow-up with your primary care physician if your symptoms persist. Return to the ED immediately if you develop any concerning signs or symptoms such as fever, productive cough, coughing up blood, or passing out.

## 2017-07-29 ENCOUNTER — Telehealth: Payer: Self-pay | Admitting: *Deleted

## 2017-07-29 NOTE — Telephone Encounter (Signed)
Called [facility] to request PET this week & was informed that they had called & left message on the 6th with no response.  Called & left message with [name] to call [facility] to schedule this week per Dr Ernestina Penna request.

## 2017-08-01 ENCOUNTER — Ambulatory Visit (HOSPITAL_COMMUNITY)
Admission: RE | Admit: 2017-08-01 | Discharge: 2017-08-01 | Disposition: A | Discharge status: Home / Self Care | Payer: Federal, State, Local not specified - PPO | Source: Ambulatory Visit | Attending: Hematology | Admitting: Hematology

## 2017-08-01 DIAGNOSIS — I700 Atherosclerosis of aorta: Secondary | ICD-10-CM | POA: Diagnosis not present

## 2017-08-01 DIAGNOSIS — C210 Malignant neoplasm of anus, unspecified: Secondary | ICD-10-CM | POA: Diagnosis not present

## 2017-08-01 DIAGNOSIS — R590 Localized enlarged lymph nodes: Secondary | ICD-10-CM | POA: Diagnosis not present

## 2017-08-01 DIAGNOSIS — C787 Secondary malignant neoplasm of liver and intrahepatic bile duct: Secondary | ICD-10-CM | POA: Insufficient documentation

## 2017-08-01 LAB — GLUCOSE, CAPILLARY: GLUCOSE-CAPILLARY: 91 mg/dL (ref 65–99)

## 2017-08-01 MED ORDER — FLUDEOXYGLUCOSE F - 18 (FDG) INJECTION
7.0700 | Freq: Once | INTRAVENOUS | Status: AC | PRN
  Administered 2017-08-01: 7.07 via INTRAVENOUS

## 2017-08-04 NOTE — Progress Notes (Signed)
[place]  [phone] [phone]  Clinic Follow up Note   Patient Care Team: Damaris Hippo, MD as PCP - General (Family Medicine) 08/06/2017   SUMMARY OF ONCOLOGIC HISTORY: Oncology History   [id number] stage IIIB      Anal cancer ([place])   03/09/2015 Initial Diagnosis    Rectal cancer ([place])      04/19/2015 Imaging    PET scan showed hypermetabolic rectal lesion and inguinal lymph nodes      05/08/2015 - 07/13/2015 Radiation Therapy    59 Gy in 35 fractions to pelvis and inguinal LNs for her anal cancer. She tolerated treatment poorly with persistent nausea, vomiting, diarrhea and a poor appetite throughout treatment, and required several hospitalization and treatment delays. She did complete the planned treatment.       05/08/2015 - 06/2015 Chemotherapy    Concurrent chemotherapy with 5-FU and mitomycin along with radiation. Pt tolerated poorly.       01/08/2016 Imaging    PET scan showed you hypermetabolic left mediastinal 1L adenopathy (1.5cm), partial response to therapy with interval decrease in extent of hypermetabolic rectal soft tissue fullness as well as resolution of inguinal adenopathy.      02/06/2017 Procedure    Colonoscopy showed scar tissue just off anal verge, no lesion otherwise.      04/16/2017 - 04/23/2017 Hospital Admission    Admitted 04/16/17 - 04/23/17 for Nausea and Vomiting and abdominal pain. CT abd noted to have findings including rectal mass and enlarged nodes. Patient underwent sigmoidoscopy with biopsy showing benign findings. IR was consulted for node biopsy and which was done and awaiting results.      04/17/2017 Imaging    CT AP W Contrast 04/17/17: IMPRESSION: 1. Left posterior perirectal lobulated mass concerning for rectal malignancy. A smaller nodular density superior to this mass likely represent a mildly enlarged metastatic node. MRI is recommended for further evaluation. 2. Retroperitoneal adenopathy most  consistent with metastatic disease, likely from rectal neoplasm. A hypodense lesion to the left of the L5 posterior to the psoas muscle is also consistent with metastatic disease. 3. Multiple hepatic hypodense lesions, incompletely characterized, possibly metastatic. MRI is recommended for further evaluation. 4. No evidence of bowel obstruction or active inflammation. Normal appendix.      04/17/2017 Pathology Results    Diagnosis Rectum, biopsy, distal - BENIGN COLONIC MUCOSA WITH EROSIONS AND ULCERATION, SEE COMMENT. - NO DYSPLASIA OR MALIGNANCY.       04/22/2017 Pathology Results    Diagnosis Lymph node, needle/core biopsy, left retroperitoneum periaortic - POORLY DIFFERENTIATED SQUAMOUS CELL CARCINOMA - SEE COMMENT Microscopic Comment The needle core biopsy has a basaloid neoplasm with increased mitoses and necrosis. There is no residual lymph node parenchyma identified. The neoplasm is immunoreactive for cytokeratin 7 (patchy), cytokeratin 5/6, and p16 but negative for CDX2 and cytokeratin 20. Overall, the histology and immunophenotype support the diagnosis of a poorly differentiated squamous cell carcinoma.       05/13/2017 PET scan    IMPRESSION: 1. Perirectal adenopathy and posterior perirectal hypermetabolic mass, associated with presumed metastatic adenopathy in knee left common iliac, retroperitoneal, thoracic paraesophageal, and left upper mediastinal regions. The left upper mediastinal mass extends into the left supraclavicular space. 2. Metastatic disease to the liver (best seen in segment 3) along with a hypermetabolic nodule compatible with metastatic disease in the superior segment of the lower lobe of the left lung.      05/16/2017 -  Antibody Plan  Nivolumab 240mg  every 2 weeks started on 05/16/17       08/01/2017 PET scan    PET 08/01/17 IMPRESSION: 1. Mixed appearance of response, with significant improvement in the thoracic metastatic disease ;  mild improvement in the abdominal adenopathy ; new and worsening metastatic disease in the liver; and a relatively stable left posterior perirectal mass. 2. Other imaging findings of potential clinical significance: Aortic Atherosclerosis (ICD10-I70.0). Prominent stool throughout the colon favors constipation.       History of Present Illness (04/17/17) [name] is a 61 y.o. 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 I was called to see her in hospital.   She is from [place], with past medical history of DVT on Xarelto, rectal cancer status post chemotherapy and radiation in 2016, stroke, asthma, who was admitted to hospital last night for nausea, vomiting and abdominal pain. CT of abdomen and pelvis showed left posterior perirectal lobulated mass concerning for rectum malignancy. Retroperitoneal adenopathy, I hypodense lesion to the left L5 to the sore S muscle, suspicious for metastasis, and multiple hepatic hypodense lesions, indeterminate. I was called to see the patient to ruled out metastatic rectal cancer recurrence. I saw the patient before her endoscopy today.  The patient reports she has a prior history of rectal cancer stage 3, squamous cell, which was treated with chemotherapy and radiation therapy. She did not have surgery. The patient reports she completed treatment in September 2016. The patient has not used her port since 2016, though it is still in place. She reports the last time she followed up with physicians for her prior cancer, they told her she had suspicious masses in her lungs and liver.  The patient does not have any medical records from [place], where she previously lived and was treated, but reports her daughter would have them. She reports she does not know if she plans to stay in [place], or if she will return to [place].    Current Treatment:  Nivolumab 240mg  every 2 weeks started  on 05/16/17   INTERVAL HISTORY:  Liliani Bobo is here for a follow up. She presents to the clinic today with her daughter. She reports to feeling good but sleepy all the time. She is down to half of codeine at morning and half at night. She has been at that rate since Sunday. The pain is tolerable for the most part. She is still doing PT at a facility. She also uses tramadol. She is able to do more at home like washing dishes and can go up and down the stairs. She will use the walker and works with cane in PT. Her Stool is not diarrhea but very soft. Not long ago she had difficulty breathing and there were no findings of a heart or lung cause and it was possibly related to anxiety. This episode has not reoccurred since.    REVIEW OF SYSTEMS:   Constitutional: Denies fevers, chills or abnormal weight loss (+) improvements in energy, still overall fatigued Eyes: Denies blurriness of vision Ears, nose, mouth, throat, and face: Denies mucositis or sore throat (+) throat is hoarse  Respiratory: Denies cough, dyspnea or wheezes Cardiovascular: Denies palpitation, chest discomfort or lower extremity swelling Gastrointestinal:  Denies heartburn  (+) nausea (+) very soft stool  Skin: Denies abnormal skin rashes Lymphatics: Denies new lymphadenopathy or easy bruising Neurological:Denies numbness, tingling or new weaknesses MSK: (+) occasional  hip pain from surgery Behavioral/Psych: Mood  is stable, no new changes  All other systems were reviewed with the patient and are negative.  MEDICAL HISTORY:  Past Medical History:  Diagnosis Date  . Anxiety   . Arthritis   . Asthma    childhood   . Cancer [facility])    colorectal cancer with chemo and radiation   . DVT (deep venous thrombosis) ([facility])    left lower extremity  . Dyspnea    with anxiety and exertion  . GERD (gastroesophageal reflux disease)   . Neuromuscular disorder ([facility])    poly neuropathy   . Stroke [facility]) 2010, 2011   TIA  x2     SURGICAL HISTORY: Past Surgical History:  Procedure Laterality Date  . CESAREAN SECTION    . FLEXIBLE SIGMOIDOSCOPY N/A 04/17/2017   Procedure: FLEXIBLE SIGMOIDOSCOPY;  Surgeon: Ronald Lobo, MD;  Location: [facility] ENDOSCOPY;  Service: Endoscopy;  Laterality: N/A;  . IR FLUORO GUIDE PORT INSERTION LEFT  06/04/2017  . IR REMOVAL TUN ACCESS W/ PORT W/O FL MOD SED  06/04/2017  . IR US GUIDE VASC ACCESS LEFT  06/04/2017  . IR VENO/EXT/UNI LEFT  06/04/2017  . THYROID CYST EXCISION    . TOTAL HIP ARTHROPLASTY Left 03/25/2017   Procedure: LEFT TOTAL HIP ARTHROPLASTY ANTERIOR APPROACH;  Surgeon: Paralee Cancel, MD;  Location: [facility];  Service: Orthopedics;  Laterality: Left;  Requests 70 mins    I have reviewed the social history and family history with the patient and they are unchanged from previous note.  ALLERGIES:  is allergic to ativan [lorazepam] and penicillins.  MEDICATIONS:  Current Outpatient Prescriptions  Medication Sig Dispense Refill  . acetaminophen-codeine (TYLENOL #3) 300-30 MG tablet Take 1 tablet by mouth every 12 (twelve) hours as needed for moderate pain. (Patient taking differently: Take 1 tablet by mouth daily. ) 40 tablet 0  . dronabinol (MARINOL) 5 MG capsule Take 1 capsule (5 mg total) by mouth 2 (two) times daily before a meal. 60 capsule 0  . LACTULOSE PO Take by mouth.    . lidocaine-prilocaine (EMLA) cream Apply 1 application topically as needed. 30 g 6  . methocarbamol (ROBAXIN) 500 MG tablet Take 1 tablet (500 mg total) by mouth every 6 (six) hours as needed for muscle spasms. 40 tablet 0  . metoCLOPramide (REGLAN) 10 MG tablet Take 1 tablet (10 mg total) by mouth 4 (four) times daily -  before meals and at bedtime. 120 tablet 0  . omeprazole (PRILOSEC) 20 MG capsule Take 20 mg by mouth daily.    . ondansetron (ZOFRAN ODT) 8 MG disintegrating tablet Take 1 tablet (8 mg total) by mouth every 8 (eight) hours as needed for nausea or vomiting. 60 tablet 2  . polyethylene  glycol (MIRALAX / GLYCOLAX) packet Take 17 g by mouth 2 (two) times daily. 14 each 0  . rivaroxaban (XARELTO) 20 MG TABS tablet Take 1 tablet (20 mg total) by mouth daily with supper. Please follow up with PCP to continue this medication. 30 tablet 0  . saccharomyces boulardii (FLORASTOR) 250 MG capsule Take 250 mg by mouth daily.    . traMADol (ULTRAM) 50 MG tablet Take 1 tablet (50 mg total) by mouth every 6 (six) hours as needed. 30 tablet 0  . docusate sodium (COLACE) 100 MG capsule Take 1 capsule (100 mg total) by mouth 2 (two) times daily. (Patient not taking: Reported on 08/06/2017) 10 capsule 0   No current facility-administered medications for this visit.     PHYSICAL EXAMINATION:  ECOG PERFORMANCE STATUS: 2 BP 117/68 (BP Location: Left Arm, Patient Position: Sitting)   Pulse 71   Temp 98.2 F (36.8 C) (Oral)   Resp 17   Ht 5' (1.524 m)   Wt 143 lb 3.2 oz (65 kg)   SpO2 100%   BMI 27.97 kg/m    GENERAL:alert, moderately distress due to nausea and abdominal discomfort  SKIN: skin color, texture, turgor are normal, no rashes or significant lesions  EYES: normal, Conjunctiva are pink and non-injected, sclera clear OROPHARYNX:no exudate, no erythema and lips, buccal mucosa, and tongue normal  NECK: supple, thyroid normal size, non-tender, without nodularity LYMPH:  no palpable lymphadenopathy in the cervical, axillary or inguinal LUNGS: clear to auscultation and percussion with normal breathing effort HEART: regular rate & rhythm and no murmurs and no lower extremity edema ABDOMEN:abdomen soft, mild diffuse tenderness, normal bowel sounds Musculoskeletal:no cyanosis of digits and no clubbing  NEURO: alert & oriented x 3 with fluent speech, no focal motor/sensory deficits  LABORATORY DATA:  I have reviewed the data as listed CBC Latest Ref Rng & Units 08/06/2017 07/26/2017 07/23/2017  WBC 3.9 - 10.3 10e3/uL 6.5 6.2 5.8  Hemoglobin 11.6 - 15.9 g/dL 12.8 12.0 12.9  Hematocrit 34.8  - 46.6 % 39.7 35.0(L) 39.6  Platelets 145 - 400 10e3/uL 318 300 331     CMP Latest Ref Rng & Units 08/06/2017 07/26/2017 07/23/2017  Glucose 70 - 140 mg/dl 112 80 109  BUN 7.0 - 26.0 mg/dL 8.4 9 9.6  Creatinine 0.6 - 1.1 mg/dL 0.8 0.45 0.7  Sodium 136 - 145 mEq/L 143 141 141  Potassium 3.5 - 5.1 mEq/L 3.8 3.1(L) 3.9  Chloride 101 - 111 mmol/L - 111 -  CO2 22 - 29 mEq/L 27 23 26   Calcium 8.4 - 10.4 mg/dL 9.4 7.7(L) 9.1  Total Protein 6.4 - 8.3 g/dL 7.0 - 6.9  Total Bilirubin 0.20 - 1.20 mg/dL 0.28 - 0.24  Alkaline Phos 40 - 150 U/L 101 - 101  AST 5 - 34 U/L 12 - 11  ALT 0-55 U/L U/L <6 - <6   PATHOLOGY:   Pathology Results: 04/22/17 Diagnosis Lymph node, needle/core biopsy, left retroperitoneum periaortic - POORLY DIFFERENTIATED SQUAMOUS CELL CARCINOMA - SEE COMMENT Microscopic Comment The needle core biopsy has a basaloid neoplasm with increased mitoses and necrosis. There is no residual lymph node parenchyma identified. The neoplasm is immunoreactive for cytokeratin 7 (patchy), cytokeratin 5/6, and p16 but negative for CDX2 and cytokeratin 20. Overall, the histology and immunophenotype support the diagnosis of a poorly differentiated squamous cell carcinoma.  Surgical Pathology 04/17/2017 Diagnosis Rectum, biopsy, distal - BENIGN COLONIC MUCOSA WITH EROSIONS AND ULCERATION, SEE COMMENT. - NO DYSPLASIA OR MALIGNANCY. Microscopic Comment There is lamina propria fibrosis and macrophages consistent with prior treatment. The case was called to Dr. Cristina Gong on 04/18/2017.  RADIOGRAPHIC STUDIES: I have personally reviewed the radiological images as listed and agreed with the findings in the report. No results found.   PET 08/01/17 IMPRESSION: 1. Mixed appearance of response, with significant improvement in the thoracic metastatic disease ; mild improvement in the abdominal adenopathy ; new and worsening metastatic disease in the liver; and a relatively stable left posterior  perirectal mass. 2. Other imaging findings of potential clinical significance: Aortic Atherosclerosis (ICD10-I70.0). Prominent stool throughout the colon favors constipation.   PET Scan 05/13/2017 IMPRESSION: 1. Perirectal adenopathy and posterior perirectal hypermetabolic mass, associated with presumed metastatic adenopathy in knee left common iliac, retroperitoneal, thoracic paraesophageal, and left  upper mediastinal regions. The left upper mediastinal mass extends into the left supraclavicular space. 2. Metastatic disease to the liver (best seen in segment 3) along with a hypermetabolic nodule compatible with metastatic disease in the superior segment of the lower lobe of the left lung.   CT AP W Contrast 04/17/17: IMPRESSION: 1. Left posterior perirectal lobulated mass concerning for rectal malignancy. A smaller nodular density superior to this mass likely represent a mildly enlarged metastatic node. MRI is recommended for further evaluation. 2. 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 A hypodense lesion to the left of the L5 posterior to the psoas muscle is also consistent with metastatic disease. 3. Multiple hepatic hypodense lesions, incompletely characterized, possibly metastatic. MRI is recommended for further evaluation. 4. No evidence of bowel obstruction or active inflammation. Normal appendix.  ASSESSMENT & PLAN:  [name] is a 61 y.o. female from [place], with past medical history of DVT on Xarelto, rectal squamous cell carcinoma status post chemotherapy and radiation in 2016, stroke, asthma, who was admitted to hospital recently for nausea, vomiting and abdominal pain. She has had those symptoms since her recent hip surgery on 04/21/2017.   1. 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 -We reviewed her recent CT scan findings, and her retroperitoneal lymph node biopsy results in details with patient and her daughter. -Unfortunately, biopsy confirmed metastatic disease to abdominal lymph nodes. Her CT scan is also suspicious for liver and pelvic metastasis. She was not able to tolerate MRI to evaluate her liver lesions. -We discussed the incurable nature of her disease at this stage, and the goal of therapy is palliative. -I previously discussed her staging PET scan results, which showed metastasis in liver, left lung and diffuse nodal metastasis in chest and abdomen.  -She has started first-line therapy with Nivolumab, tolerating well. -She has successfully weaned off hydrocodone. She is on tylenol #3 twice daily, I encouraged her to continue weaning, she can use tramadol as needed. - I encouraged her to take Colace and do Miralax 4-5 days a week to help with constipation -PET scan from 08/01/17 reviewed. Mixed response, her low cervical and thoracic lymph nodes have significantly decreased. However her liver metastasis have increased in size and metabolism. Her abdominal adenopathy has been stable. Overall she has a mixed response to immunotherapy.  -We discussed the liver metastasis change could be true disease progression, versus pseudo-progression secondary to immune response to therapy. -She is clinically doing well, overall symptoms have much improved, but is she may still not be strong enough to do intensive chemotherapy. -I reviewed option of change treatment to chemotherapy, versus continue Nivolumab and a follow-up scan in 8 weeks. -After a lengthy discussion, we decided to continue new: Was close follow-up. If she shows signs and symptoms of cancer progression we will move to second line chemo sooner. She agrees. - I discussed that based on her stage,  her cancer is not curative. Her treatment will be palliative and treatment will go on for as long as she can tolerate it.  -She has clinically doingwell, lab reviewed, CBC and CMP are within normal limits.  -I discussed to ways to help liver function Her liver function is stable and normal.   -We'll proceed with Nivolumab today and continue every 2 weeks -  f/u in 2 weeks   2. Nausea, abdominal pain, secondary to constipation and metastatic cancer -Due to her sever nausea and low appetite, I have prescribed her Marinol 2.5mg  twice a day. -She'll continue Reglan, 3-4 a day and Zofran as needed  -overall much improved latley   3. Depression/anxiety and insomnia  -I suggest Mirtazapine, but she wishes to wait until after she is weaned off hydrocodone, she has been having a lot of anxiety during the weaning process of narcotics. -In the meantime I suggest sleep aid like melatonin over-the-counter  - I referred her to social worker  4. Left hip pain after hip replacement  -She underwent a total left hip replacement by Dr. Alvan Dame on 03/25/2017 -She has weaned off hydrocodone, on Tylenol No. 3 twice daily now -she will continue physical therapy as outpatient, she is overall improving. -I suggest the pt reduce Tylenol #3 by half tablets to  replace with tramadol, starting with the morning dose.   5. Goal of care discussion  -We again discussed the incurable nature of her cancer, and the overall poor prognosis, especially if she does not have good response to chemotherapy or progress on chemo -The patient understands the goal of care is palliative. -I previously recommend DNR/DNI, she will think about it    PLAN:  - scan reviewed, mixed response, we decided to continue Nivolumab for now  -Labs reviewed. She is adequate for Nivolimab today  -Lab, flush, f/u and nivolumab in 2 and 4 weeks     No orders of the defined types were placed in this encounter.  All questions were answered. The patient  knows to call the clinic with any problems, questions or concerns. No barriers to learning was detected.  I spent 25 minutes counseling the patient face to face. The total time spent in the appointment was 30 minutes and more than 50% was on counseling and review of test results  This document serves as a record of services personally performed by Truitt Merle, MD. It was created on her behalf by Joslyn Devon, a trained medical scribe. The creation of this record is based on the scribe's personal observations and the provider's statements to them. This document has been checked and approved by the attending provider.    Truitt Merle  08/06/2017

## 2017-08-06 ENCOUNTER — Encounter: Payer: Self-pay | Admitting: Hematology

## 2017-08-06 ENCOUNTER — Ambulatory Visit (HOSPITAL_BASED_OUTPATIENT_CLINIC_OR_DEPARTMENT_OTHER): Payer: Federal, State, Local not specified - PPO | Admitting: Hematology

## 2017-08-06 ENCOUNTER — Ambulatory Visit (HOSPITAL_BASED_OUTPATIENT_CLINIC_OR_DEPARTMENT_OTHER): Payer: Federal, State, Local not specified - PPO

## 2017-08-06 ENCOUNTER — Telehealth: Payer: Self-pay

## 2017-08-06 ENCOUNTER — Ambulatory Visit: Payer: Federal, State, Local not specified - PPO

## 2017-08-06 ENCOUNTER — Other Ambulatory Visit (HOSPITAL_BASED_OUTPATIENT_CLINIC_OR_DEPARTMENT_OTHER): Payer: Federal, State, Local not specified - PPO

## 2017-08-06 ENCOUNTER — Ambulatory Visit: Payer: Federal, State, Local not specified - PPO | Admitting: Nutrition

## 2017-08-06 VITALS — BP 117/68 | HR 71 | Temp 98.2°F | Resp 17 | Ht 60.0 in | Wt 143.2 lb

## 2017-08-06 DIAGNOSIS — C787 Secondary malignant neoplasm of liver and intrahepatic bile duct: Secondary | ICD-10-CM

## 2017-08-06 DIAGNOSIS — Z95828 Presence of other vascular implants and grafts: Secondary | ICD-10-CM

## 2017-08-06 DIAGNOSIS — Z5112 Encounter for antineoplastic immunotherapy: Secondary | ICD-10-CM | POA: Diagnosis not present

## 2017-08-06 DIAGNOSIS — I82409 Acute embolism and thrombosis of unspecified deep veins of unspecified lower extremity: Secondary | ICD-10-CM

## 2017-08-06 DIAGNOSIS — C772 Secondary and unspecified malignant neoplasm of intra-abdominal lymph nodes: Secondary | ICD-10-CM

## 2017-08-06 DIAGNOSIS — G893 Neoplasm related pain (acute) (chronic): Secondary | ICD-10-CM | POA: Diagnosis not present

## 2017-08-06 DIAGNOSIS — R112 Nausea with vomiting, unspecified: Secondary | ICD-10-CM

## 2017-08-06 DIAGNOSIS — C801 Malignant (primary) neoplasm, unspecified: Secondary | ICD-10-CM

## 2017-08-06 DIAGNOSIS — R194 Change in bowel habit: Secondary | ICD-10-CM

## 2017-08-06 DIAGNOSIS — C210 Malignant neoplasm of anus, unspecified: Secondary | ICD-10-CM | POA: Diagnosis not present

## 2017-08-06 DIAGNOSIS — Z86718 Personal history of other venous thrombosis and embolism: Secondary | ICD-10-CM | POA: Diagnosis not present

## 2017-08-06 LAB — CBC WITH DIFFERENTIAL/PLATELET
BASO%: 0.3 % (ref 0.0–2.0)
Basophils Absolute: 0 10*3/uL (ref 0.0–0.1)
EOS%: 4.2 % (ref 0.0–7.0)
Eosinophils Absolute: 0.3 10*3/uL (ref 0.0–0.5)
HEMATOCRIT: 39.7 % (ref 34.8–46.6)
HEMOGLOBIN: 12.8 g/dL (ref 11.6–15.9)
LYMPH#: 1.2 10*3/uL (ref 0.9–3.3)
LYMPH%: 18.1 % (ref 14.0–49.7)
MCH: 26.2 pg (ref 25.1–34.0)
MCHC: 32.1 g/dL (ref 31.5–36.0)
MCV: 81.6 fL (ref 79.5–101.0)
MONO#: 0.6 10*3/uL (ref 0.1–0.9)
MONO%: 9.4 % (ref 0.0–14.0)
NEUT#: 4.4 10*3/uL (ref 1.5–6.5)
NEUT%: 68 % (ref 38.4–76.8)
Platelets: 318 10*3/uL (ref 145–400)
RBC: 4.87 10*6/uL (ref 3.70–5.45)
RDW: 16.6 % — AB (ref 11.2–14.5)
WBC: 6.5 10*3/uL (ref 3.9–10.3)

## 2017-08-06 LAB — COMPREHENSIVE METABOLIC PANEL
ALBUMIN: 3.5 g/dL (ref 3.5–5.0)
AST: 12 U/L (ref 5–34)
Alkaline Phosphatase: 101 U/L (ref 40–150)
Anion Gap: 8 mEq/L (ref 3–11)
BUN: 8.4 mg/dL (ref 7.0–26.0)
CALCIUM: 9.4 mg/dL (ref 8.4–10.4)
CHLORIDE: 109 meq/L (ref 98–109)
CO2: 27 mEq/L (ref 22–29)
CREATININE: 0.8 mg/dL (ref 0.6–1.1)
EGFR: >90 mL/min/{1.73_m2} (ref >90)
Glucose: 112 mg/dl (ref 70–140)
Potassium: 3.8 mEq/L (ref 3.5–5.1)
Sodium: 143 mEq/L (ref 136–145)
Total Bilirubin: 0.28 mg/dL (ref 0.20–1.20)
Total Protein: 7 g/dL (ref 6.4–8.3)

## 2017-08-06 MED ORDER — SODIUM CHLORIDE 0.9 % IV SOLN
240.0000 mg | Freq: Once | INTRAVENOUS | Status: AC
  Administered 2017-08-06: 240 mg via INTRAVENOUS
  Filled 2017-08-06: qty 24

## 2017-08-06 MED ORDER — HEPARIN SOD (PORK) LOCK FLUSH 100 UNIT/ML IV SOLN
500.0000 [IU] | Freq: Once | INTRAVENOUS | Status: AC | PRN
  Administered 2017-08-06: 500 [IU]
  Filled 2017-08-06: qty 5

## 2017-08-06 MED ORDER — SODIUM CHLORIDE 0.9 % IV SOLN
Freq: Once | INTRAVENOUS | Status: AC
  Administered 2017-08-06: 15:00:00 via INTRAVENOUS

## 2017-08-06 MED ORDER — SODIUM CHLORIDE 0.9% FLUSH
10.0000 mL | INTRAVENOUS | Status: DC | PRN
  Administered 2017-08-06: 10 mL
  Filled 2017-08-06: qty 10

## 2017-08-06 MED ORDER — SODIUM CHLORIDE 0.9% FLUSH
10.0000 mL | Freq: Once | INTRAVENOUS | Status: DC
Start: 2017-08-06 — End: 2017-08-06
  Filled 2017-08-06: qty 10

## 2017-08-06 NOTE — Patient Instructions (Signed)
[facility] Discharge Instructions for Patients Receiving Chemotherapy  Today you received the following chemotherapy agents Nivolumab  To help prevent nausea and vomiting after your treatment, we encourage you to take your nausea medication     If you develop nausea and vomiting that is not controlled by your nausea medication, call the clinic.   BELOW ARE SYMPTOMS THAT SHOULD BE REPORTED IMMEDIATELY:  *FEVER GREATER THAN 100.5 F  *CHILLS WITH OR WITHOUT FEVER  NAUSEA AND VOMITING THAT IS NOT CONTROLLED WITH YOUR NAUSEA MEDICATION  *UNUSUAL SHORTNESS OF BREATH  *UNUSUAL BRUISING OR BLEEDING  TENDERNESS IN MOUTH AND THROAT WITH OR WITHOUT PRESENCE OF ULCERS  *URINARY PROBLEMS  *BOWEL PROBLEMS  UNUSUAL RASH Items with * indicate a potential emergency and should be followed up as soon as possible.  Feel free to call the clinic you have any questions or concerns. The clinic phone number is [phone].  Please show the CHEMO ALERT CARD at check-in to the Emergency Department and triage nurse.   

## 2017-08-06 NOTE — Progress Notes (Signed)
Nutrition follow-up completed with patient during chemotherapy for anal cancer. Weight documented as 143.2 pounds August 15 relatively stable from 145.1 pounds July 19 Patient reports nausea is improved with nausea medication. Constipation alternates with diarrhea. Patient states she is going to start taking a multivitamin.  Nutrition diagnosis:  Food and nutrition related knowledge deficit has improved.  Intervention: Educated patient to continue strategies for adequate calories and protein intake for weight maintenance. Recommended bowel regimen for patient. Discussed multivitamin use with patient. Questions were answered.  Teach back method used.  Monitoring, evaluation, goals: Patient will tolerate adequate calories and protein for weight maintenance.  Next visit: To be scheduled as needed.  **Disclaimer: This note was dictated with voice recognition software. Similar sounding words can inadvertently be transcribed and this note may contain transcription errors which may not have been corrected upon publication of note.**

## 2017-08-06 NOTE — Telephone Encounter (Signed)
Gave patient avs, and calender with new appointments for 8/29, and 9/12

## 2017-08-07 ENCOUNTER — Telehealth: Payer: Self-pay | Admitting: Hematology

## 2017-08-07 NOTE — Telephone Encounter (Signed)
Went to check out pt and in the 8/15 los it said she needed a flush with her other appts. All of the other additional appts had been scheduled so I went in and added the flush for her.   Left her a voicemail about the addition of these appts and the changes in times.   Sending her a reminder letter in the mail.

## 2017-08-08 ENCOUNTER — Telehealth: Payer: Self-pay

## 2017-08-08 NOTE — Telephone Encounter (Signed)
Pt was sick to her stomach and vomiting. Pt's LBM just a minute ago, a series of small ones, tending toward the hard side with mucus on them. Only enough water to take her medications today. Is only taking about 2 cups per day. Colace was 2 bid but she stopped b/c of diarrhea. She had also stopped miralax b/c colace was working.   Discussed increasing fluids to 64 oz or 8 cups per day. Discussed using colace at a decreased number per day. Pt believes n/v was from yogurt after miralax. She has not used zofran today.   Daughter appreciated discussion.

## 2017-08-13 ENCOUNTER — Emergency Department (HOSPITAL_COMMUNITY): Payer: Federal, State, Local not specified - PPO

## 2017-08-13 ENCOUNTER — Encounter (HOSPITAL_COMMUNITY): Payer: Self-pay | Admitting: *Deleted

## 2017-08-13 ENCOUNTER — Emergency Department (HOSPITAL_COMMUNITY)
Admission: EM | Admit: 2017-08-13 | Discharge: 2017-08-13 | Disposition: A | Discharge status: Home / Self Care | Payer: Federal, State, Local not specified - PPO | Source: Home / Self Care | Attending: Emergency Medicine | Admitting: Emergency Medicine

## 2017-08-13 DIAGNOSIS — E860 Dehydration: Secondary | ICD-10-CM | POA: Insufficient documentation

## 2017-08-13 DIAGNOSIS — K5903 Drug induced constipation: Secondary | ICD-10-CM

## 2017-08-13 DIAGNOSIS — R112 Nausea with vomiting, unspecified: Secondary | ICD-10-CM | POA: Diagnosis not present

## 2017-08-13 DIAGNOSIS — Z86718 Personal history of other venous thrombosis and embolism: Secondary | ICD-10-CM

## 2017-08-13 DIAGNOSIS — R197 Diarrhea, unspecified: Secondary | ICD-10-CM | POA: Insufficient documentation

## 2017-08-13 DIAGNOSIS — Z87891 Personal history of nicotine dependence: Secondary | ICD-10-CM | POA: Insufficient documentation

## 2017-08-13 DIAGNOSIS — T402X5A Adverse effect of other opioids, initial encounter: Secondary | ICD-10-CM

## 2017-08-13 DIAGNOSIS — Z85038 Personal history of other malignant neoplasm of large intestine: Secondary | ICD-10-CM

## 2017-08-13 DIAGNOSIS — Z96642 Presence of left artificial hip joint: Secondary | ICD-10-CM | POA: Insufficient documentation

## 2017-08-13 DIAGNOSIS — Z79899 Other long term (current) drug therapy: Secondary | ICD-10-CM | POA: Insufficient documentation

## 2017-08-13 DIAGNOSIS — C772 Secondary and unspecified malignant neoplasm of intra-abdominal lymph nodes: Secondary | ICD-10-CM | POA: Diagnosis not present

## 2017-08-13 LAB — URINALYSIS, ROUTINE W REFLEX MICROSCOPIC
BILIRUBIN URINE: NEGATIVE
Glucose, UA: 50 mg/dL — AB
Ketones, ur: 80 mg/dL — AB
NITRITE: NEGATIVE
PH: 8 (ref 5.0–8.0)
Protein, ur: 30 mg/dL — AB
SPECIFIC GRAVITY, URINE: 1.009 (ref 1.005–1.030)

## 2017-08-13 LAB — COMPREHENSIVE METABOLIC PANEL
ALBUMIN: 4.1 g/dL (ref 3.5–5.0)
ALK PHOS: 98 U/L (ref 38–126)
ALT: 10 U/L — AB (ref 14–54)
AST: 17 U/L (ref 15–41)
Anion gap: 10 (ref 5–15)
BILIRUBIN TOTAL: 0.6 mg/dL (ref 0.3–1.2)
BUN: 5 mg/dL — AB (ref 6–20)
CO2: 26 mmol/L (ref 22–32)
CREATININE: 0.62 mg/dL (ref 0.44–1.00)
Calcium: 9.3 mg/dL (ref 8.9–10.3)
Chloride: 104 mmol/L (ref 101–111)
GFR calc Af Amer: >60 mL/min (ref >60)
Glucose, Bld: 180 mg/dL — ABNORMAL HIGH (ref 65–99)
Potassium: 3.5 mmol/L (ref 3.5–5.1)
SODIUM: 140 mmol/L (ref 135–145)
TOTAL PROTEIN: 7.5 g/dL (ref 6.5–8.1)

## 2017-08-13 LAB — LIPASE, BLOOD: Lipase: 20 U/L (ref 11–51)

## 2017-08-13 LAB — CBC
HCT: 40.1 % (ref 36.0–46.0)
Hemoglobin: 14 g/dL (ref 12.0–15.0)
MCH: 26.9 pg (ref 26.0–34.0)
MCHC: 34.9 g/dL (ref 30.0–36.0)
MCV: 77.1 fL — AB (ref 78.0–100.0)
PLATELETS: 325 10*3/uL (ref 150–400)
RBC: 5.2 MIL/uL — ABNORMAL HIGH (ref 3.87–5.11)
RDW: 16 % — AB (ref 11.5–15.5)
WBC: 10.6 10*3/uL — AB (ref 4.0–10.5)

## 2017-08-13 MED ORDER — IOPAMIDOL (ISOVUE-300) INJECTION 61%
INTRAVENOUS | Status: AC
  Administered 2017-08-13: 100 mL
  Filled 2017-08-13: qty 100

## 2017-08-13 MED ORDER — HYDROMORPHONE HCL 1 MG/ML IJ SOLN
1.0000 mg | Freq: Once | INTRAMUSCULAR | Status: AC
  Administered 2017-08-13: 1 mg via INTRAVENOUS
  Filled 2017-08-13: qty 1

## 2017-08-13 MED ORDER — LOPERAMIDE HCL 2 MG PO CAPS
4.0000 mg | ORAL_CAPSULE | Freq: Once | ORAL | Status: AC
  Administered 2017-08-13: 4 mg via ORAL
  Filled 2017-08-13: qty 2

## 2017-08-13 MED ORDER — ONDANSETRON HCL 4 MG/2ML IJ SOLN
4.0000 mg | Freq: Once | INTRAMUSCULAR | Status: AC
  Administered 2017-08-13: 4 mg via INTRAVENOUS
  Filled 2017-08-13: qty 2

## 2017-08-13 MED ORDER — LOPERAMIDE HCL 2 MG PO TABS: 2.0000 mg | ORAL_TABLET | Freq: Four times a day (QID) | ORAL | 0 refills | Status: DC | PRN

## 2017-08-13 MED ORDER — SODIUM CHLORIDE 0.9 % IV BOLUS (SEPSIS)
500.0000 mL | Freq: Once | INTRAVENOUS | Status: AC
  Administered 2017-08-13: 500 mL via INTRAVENOUS

## 2017-08-13 MED ORDER — SODIUM CHLORIDE 0.9 % IV BOLUS (SEPSIS)
1000.0000 mL | Freq: Once | INTRAVENOUS | Status: AC
  Administered 2017-08-13: 1000 mL via INTRAVENOUS

## 2017-08-13 MED ORDER — PROMETHAZINE HCL 25 MG PO TABS: 25.0000 mg | ORAL_TABLET | Freq: Four times a day (QID) | ORAL | 1 refills | Status: DC | PRN

## 2017-08-13 MED ORDER — PROMETHAZINE HCL 25 MG/ML IJ SOLN
12.5000 mg | Freq: Once | INTRAMUSCULAR | Status: AC
  Administered 2017-08-13: 12.5 mg via INTRAVENOUS
  Filled 2017-08-13: qty 1

## 2017-08-13 MED ORDER — ONDANSETRON 4 MG PO TBDP: 4.0000 mg | ORAL_TABLET | Freq: Three times a day (TID) | ORAL | 1 refills | Status: AC | PRN

## 2017-08-13 MED ORDER — DICYCLOMINE HCL 10 MG/ML IM SOLN
20.0000 mg | Freq: Once | INTRAMUSCULAR | Status: AC
  Administered 2017-08-13: 20 mg via INTRAMUSCULAR
  Filled 2017-08-13: qty 2

## 2017-08-13 NOTE — ED Triage Notes (Signed)
Pt is in the BR with family member

## 2017-08-13 NOTE — ED Notes (Signed)
Patient transported to CT 

## 2017-08-13 NOTE — ED Provider Notes (Signed)
[facility] DEPT Provider Note   CSN: [id number] Arrival date & time: 08/13/17  0152  Time seen 3:40 AM   History   Chief Complaint Chief Complaint  Patient presents with  . Diarrhea  . chemo card    HPI [name] is a 61 y.o. female.  HPI  daughter reports that patient was constipated on August 17. Patient was screaming and crying. She called the cancer center and they told her to start her on Colace daily and to give her MiraLAX daily. She had a bowel movement and felt fine. Today she again started having abdominal discomfort. She couldn't eat today and her daughter gave her MiraLAX with prune juice twice and a bottle magnesium citrate about 12:30 this morning. About 11 PM she had vomiting about 4-5 times. She has had hard bowel movements 3 and then they got watery. The daughter denies confusion but states the patient is "out of it". She states the patient's eyes are wandering in her head is moving around. They deny fever. She has had something similar before but they cannot recall what was wrong with her. 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 She is currently getting immunotherapy. Review her chart shows that she had rectal cancer.  PCP Damaris Hippo, MD Oncology Dr Burr Medico  Past Medical History:  Diagnosis Date  . Anxiety   . Arthritis   . Asthma    childhood   . Cancer [facility])    colorectal cancer with chemo and radiation   . DVT (deep venous thrombosis) (HCC)    left lower extremity  . Dyspnea    with anxiety and exertion  . GERD (gastroesophageal reflux disease)   . Neuromuscular disorder (HCC)    poly neuropathy   . Stroke [facility]) 2010, 2011   TIA  x2    Patient Active Problem List   Diagnosis Date Noted  . Port catheter in place 05/23/2017  . 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 Goals of care, counseling/discussion 05/01/2017  . Cancer ([place])   . Nausea & vomiting 04/17/2017  . Abdominal pain  04/17/2017  . 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 Rectal bleeding 04/17/2017  . Acute encephalopathy 04/17/2017  . DVT (deep venous thrombosis) ([place])   . Asthma   . GERD (gastroesophageal reflux disease)   . 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 CESAREAN SECTION    . FLEXIBLE SIGMOIDOSCOPY N/A 04/17/2017   Procedure: FLEXIBLE SIGMOIDOSCOPY;  Surgeon: Ronald Lobo, MD;  Location: [facility] ENDOSCOPY;  Service: Endoscopy;  Laterality: N/A;  . IR FLUORO GUIDE PORT INSERTION LEFT  06/04/2017  . IR REMOVAL TUN ACCESS W/ PORT W/O FL MOD SED  06/04/2017  . IR US GUIDE VASC ACCESS LEFT  06/04/2017  . IR VENO/EXT/UNI LEFT  06/04/2017  . THYROID CYST EXCISION    . TOTAL HIP ARTHROPLASTY Left 03/25/2017   Procedure: LEFT TOTAL HIP ARTHROPLASTY ANTERIOR APPROACH;  Surgeon: Paralee Cancel, MD;  Location: [facility] ORS;  Service: Orthopedics;  Laterality: Left;  Requests 70 mins    OB History    No data available       Home Medications    Prior to Admission medications   Medication Sig Start Date End Date Taking? Authorizing Provider  acetaminophen-codeine (TYLENOL #3) 300-30 MG tablet Take 1 tablet by mouth every 12 (twelve) hours as needed for moderate pain. Patient taking differently: Take 0.5 tablets by  mouth daily.  07/15/17  Yes Truitt Merle, MD  docusate sodium (COLACE) 100 MG capsule Take 1 capsule (100 mg total) by mouth 2 (two) times daily. 03/25/17  Yes Babish, Rodman Key, PA-C  dronabinol (MARINOL) 5 MG capsule Take 1 capsule (5 mg total) by mouth 2 (two) times daily before a meal. 07/10/17  Yes Truitt Merle, MD  LACTULOSE PO Take 1 capsule by mouth as needed (constipation).    Yes [provider]  lidocaine-prilocaine (EMLA) cream Apply 1 application topically as needed. Patient taking differently: Apply 1 application topically as needed (for port).  05/16/17  Yes Truitt Merle, MD  magnesium citrate SOLN Take 1 Bottle by mouth once.   Yes [provider]    methocarbamol (ROBAXIN) 500 MG tablet Take 1 tablet (500 mg total) by mouth every 6 (six) hours as needed for muscle spasms. 03/25/17  Yes Babish, Rodman Key, PA-C  metoCLOPramide (REGLAN) 10 MG tablet Take 1 tablet (10 mg total) by mouth 4 (four) times daily -  before meals and at bedtime. 06/27/17  Yes Truitt Merle, MD  omeprazole (PRILOSEC) 20 MG capsule Take 20 mg by mouth daily.   Yes [provider]  ondansetron (ZOFRAN ODT) 8 MG disintegrating tablet Take 1 tablet (8 mg total) by mouth every 8 (eight) hours as needed for nausea or vomiting. 07/23/17  Yes Truitt Merle, MD  polyethylene glycol [facility] / GLYCOLAX) packet Take 17 g by mouth 2 (two) times daily. 03/25/17  Yes Babish, Rodman Key, PA-C  rivaroxaban (XARELTO) 20 MG TABS tablet Take 1 tablet (20 mg total) by mouth daily with supper. Please follow up with PCP to continue this medication. 03/27/17  Yes Babish, Rodman Key, PA-C  saccharomyces boulardii (FLORASTOR) 250 MG capsule Take 250 mg by mouth daily.   Yes [provider]  traMADol (ULTRAM) 50 MG tablet Take 1 tablet (50 mg total) by mouth every 6 (six) hours as needed. Patient taking differently: Take 50 mg by mouth every 6 (six) hours as needed for moderate pain.  07/10/17  Yes Truitt Merle, MD    Family History Family History  Problem Relation Age of Onset  . Hypertension Mother   . Dementia Mother     Social History Social History  Substance Use Topics  . Smoking status: Former Smoker    Types: Cigarettes  . Smokeless tobacco: Never Used     Comment: 20 years ago   . Alcohol use No  uses a walker since hip replacement over a year ago Living with daughter   Allergies   Ativan [lorazepam] and Penicillins   Review of Systems Review of Systems  All other systems reviewed and are negative.    Physical Exam Updated Vital Signs BP (!) 181/80 (BP Location: Right Arm)   Pulse (!) 49   Temp 98.3 F (36.8 C) (Oral)   Resp 16   SpO2 96%   Vital signs normal except  for hypotension and bradycardia   Physical Exam  Constitutional: She is oriented to person, place, and time. She appears well-developed and well-nourished.  Non-toxic appearance. She does not appear ill. No distress.  HENT:  Head: Normocephalic and atraumatic.  Right Ear: External ear normal.  Left Ear: External ear normal.  Nose: Nose normal. No mucosal edema or rhinorrhea.  Mouth/Throat: Oropharynx is clear and moist and mucous membranes are normal. No dental abscesses or uvula swelling.  Eyes: Pupils are equal, round, and reactive to light. Conjunctivae and EOM are normal.  Neck: Normal range of motion  and full passive range of motion without pain. Neck supple.  Cardiovascular: Normal rate, regular rhythm and normal heart sounds.  Exam reveals no gallop and no friction rub.   No murmur heard. Pulmonary/Chest: Effort normal and breath sounds normal. No respiratory distress. She has no wheezes. She has no rhonchi. She has no rales. She exhibits no tenderness and no crepitus.  Abdominal: Soft. Normal appearance and bowel sounds are normal. She exhibits no distension. There is no tenderness. There is no rebound and no guarding.  Patient's abdomen is nontender and it is not distended.  Musculoskeletal: Normal range of motion. She exhibits no edema or tenderness.  Moves all extremities well.   Neurological: She is alert and oriented to person, place, and time. She has normal strength. No cranial nerve deficit.  Skin: Skin is warm, dry and intact. No rash noted. No erythema. No pallor.  Psychiatric: She has a normal mood and affect. Her speech is normal and behavior is normal. Her mood appears not anxious.  Nursing note and vitals reviewed.    ED Treatments / Results  Labs (all labs ordered are listed, but only abnormal results are displayed) Results for orders placed or performed during the hospital encounter of 08/13/17  Comprehensive metabolic panel  Result Value Ref Range   Sodium  140 135 - 145 mmol/L   Potassium 3.5 3.5 - 5.1 mmol/L   Chloride 104 101 - 111 mmol/L   CO2 26 22 - 32 mmol/L   Glucose, Bld 180 (H) 65 - 99 mg/dL   BUN 5 (L) 6 - 20 mg/dL   Creatinine, Ser 0.62 0.44 - 1.00 mg/dL   Calcium 9.3 8.9 - 10.3 mg/dL   Total Protein 7.5 6.5 - 8.1 g/dL   Albumin 4.1 3.5 - 5.0 g/dL   AST 17 15 - 41 U/L   ALT 10 (L) 14 - 54 U/L   Alkaline Phosphatase 98 38 - 126 U/L   Total Bilirubin 0.6 0.3 - 1.2 mg/dL   GFR calc non Af Amer >60 >60 mL/min   GFR calc Af Amer >60 >60 mL/min   Anion gap 10 5 - 15  CBC  Result Value Ref Range   WBC 10.6 (H) 4.0 - 10.5 K/uL   RBC 5.20 (H) 3.87 - 5.11 MIL/uL   Hemoglobin 14.0 12.0 - 15.0 g/dL   HCT 40.1 36.0 - 46.0 %   MCV 77.1 (L) 78.0 - 100.0 fL   MCH 26.9 26.0 - 34.0 pg   MCHC 34.9 30.0 - 36.0 g/dL   RDW 16.0 (H) 11.5 - 15.5 %   Platelets 325 150 - 400 K/uL  Urinalysis, Routine w reflex microscopic  Result Value Ref Range   Color, Urine YELLOW YELLOW   APPearance HAZY (A) CLEAR   Specific Gravity, Urine 1.009 1.005 - 1.030   pH 8.0 5.0 - 8.0   Glucose, UA 50 (A) NEGATIVE mg/dL   Hgb urine dipstick SMALL (A) NEGATIVE   Bilirubin Urine NEGATIVE NEGATIVE   Ketones, ur 80 (A) NEGATIVE mg/dL   Protein, ur 30 (A) NEGATIVE mg/dL   Nitrite NEGATIVE NEGATIVE   Leukocytes, UA TRACE (A) NEGATIVE   RBC / HPF 0-5 0 - 5 RBC/hpf   WBC, UA 0-5 0 - 5 WBC/hpf   Bacteria, UA FEW (A) NONE SEEN   Squamous Epithelial / LPF 0-5 (A) NONE SEEN   Mucus PRESENT   Lipase, blood  Result Value Ref Range   Lipase 20 11 - 51 U/L  Laboratory interpretation all normal except hyperglycemia, mild leukocytosis   EKG  EKG Interpretation None       Radiology Dg Abdomen Acute W/chest  Result Date: 08/13/2017 CLINICAL DATA:  Abdominal pain and constipation.  Vomiting. EXAM: DG ABDOMEN ACUTE W/ 1V CHEST COMPARISON:  Head CT 08/01/2017 FINDINGS: Tip of the left chest port in the mid SVC. Normal heart size and mediastinal contours. No  consolidation, pleural fluid or pneumothorax. Mild gaseous distention and air-fluid level within the transverse and descending colon. No significant formed stool is seen. No bowel dilatation to suggest obstruction. No free air. Pelvic phleboliths. No radiopaque calculi. Left hip arthroplasty. IMPRESSION: 1. Air-fluid levels within the transverse and descending colon, consistent with liquid stool. No significant formed stool. No dilated bowel loops to suggest obstruction. 2.  No acute pulmonary process. Electronically Signed   By: Jeb Levering M.D.   On: 08/13/2017 06:57    Dg Chest 2 View  Result Date: 07/26/2017 CLINICAL DATA:  New onset of shortness of breath. History of anal cancer.IMPRESSION: No active cardiopulmonary disease. Electronically Signed   By: Markus Daft M.D.   On: 07/26/2017 10:43   Nm Pet Image Restag (ps) Skull Base To Thigh  Result Date: 08/01/2017 CLINICAL DATA:  Subsequent treatment strategy for anal cancer. IMPRESSION: 1. Mixed appearance of response, with significant improvement in the thoracic metastatic disease ; mild improvement in the abdominal adenopathy ; new and worsening metastatic disease in the liver; and a relatively stable left posterior perirectal mass. 2. Other imaging findings of potential clinical significance: Aortic Atherosclerosis (ICD10-I70.0). Prominent stool throughout the colon favors constipation. Electronically Signed   By: Van Clines M.D.   On: 08/01/2017 12:23     Procedures Procedures (including critical care time)  Medications Ordered in ED Medications  loperamide (IMODIUM) capsule 4 mg (not administered)  ondansetron (ZOFRAN) injection 4 mg (not administered)  sodium chloride 0.9 % bolus 1,000 mL (not administered)  dicyclomine (BENTYL) injection 20 mg (not administered)  sodium chloride 0.9 % bolus 1,000 mL (0 mLs Intravenous Stopped 08/13/17 0738)  sodium chloride 0.9 % bolus 500 mL (0 mLs Intravenous Stopped 08/13/17 0709)    ondansetron (ZOFRAN) injection 4 mg (4 mg Intravenous Given 08/13/17 0444)     Initial Impression / Assessment and Plan / ED Course  I have reviewed the triage vital signs and the nursing notes.  Pertinent labs & imaging results that were available during my care of the patient were reviewed by me and considered in my medical decision making (see chart for details).     Patient was given Zofran for nausea. She was given IV fluids due to her having had vomiting. X-rays were done to see if patient has a lot of stool or evidence of blockage.   Patient was noted to spend a long time in the bathroom. Her daughter states she was having lots of continuous watery diarrhea and stool. Daughter states patient has been off her hydrocodone since the end of July.  7:20 AM Recheck, patient states she still has some nausea. She complains of some abdominal cramping. She was given more Zofran and more IV fluids. She was given Imodium orally Bentyl IM.  8:20 AM patient was turned over to Dr. Rogene Houston at change of shift to see how she does with the new medication.   Review of Dr. Ernestina Penna note from August 15 discusses they are weaning patient off her narcotics. When I asked the patient and her daughter if she is on  narcotics they state she only takes tramadol but she's not taking any today. His note states she's being weaned off hydrocodone. She also states she's taking half of Tylenol No. 3 tablets for pain. In his note he states he discussed the overall poor prognosis, he also discussed their goal was palliative care, they discussed DNR/DNI however she wanted to think about that.  Final Clinical Impressions(s) / ED Diagnoses   Final diagnoses:  Nausea and vomiting, intractability of vomiting not specified, unspecified vomiting type  Diarrhea, unspecified type   Disposition pending per Dr Manuella Ghazi, MD, Barbette Or, MD 08/13/17 [phone]

## 2017-08-13 NOTE — Discharge Instructions (Addendum)
Stop the colace and miralax. Take imodium OTC as needed now for diarrhea.  Recheck if you get dehydrated. CT scan of the abdomen without any acute findings. Take the Zofran and Phenergan as needed for nausea and vomiting. Take your pain medicine that you have at home. Also take the Imodium right ear. Follow-up with your hematology oncology doctors. Return for any new or worse symptoms.

## 2017-08-14 ENCOUNTER — Ambulatory Visit (HOSPITAL_BASED_OUTPATIENT_CLINIC_OR_DEPARTMENT_OTHER): Payer: Federal, State, Local not specified - PPO

## 2017-08-14 ENCOUNTER — Encounter: Payer: Self-pay | Admitting: Hematology

## 2017-08-14 ENCOUNTER — Inpatient Hospital Stay (HOSPITAL_COMMUNITY)
Admission: AD | Admit: 2017-08-14 | Discharge: 2017-08-19 | DRG: 841 | Disposition: A | Discharge status: Home Health | Payer: Federal, State, Local not specified - PPO | Source: Ambulatory Visit | Attending: Internal Medicine | Admitting: Internal Medicine

## 2017-08-14 ENCOUNTER — Telehealth: Payer: Self-pay | Admitting: *Deleted

## 2017-08-14 ENCOUNTER — Other Ambulatory Visit (HOSPITAL_BASED_OUTPATIENT_CLINIC_OR_DEPARTMENT_OTHER): Payer: Federal, State, Local not specified - PPO

## 2017-08-14 ENCOUNTER — Inpatient Hospital Stay (HOSPITAL_COMMUNITY): Payer: Federal, State, Local not specified - PPO

## 2017-08-14 ENCOUNTER — Encounter (HOSPITAL_COMMUNITY): Payer: Self-pay | Admitting: *Deleted

## 2017-08-14 ENCOUNTER — Ambulatory Visit (HOSPITAL_BASED_OUTPATIENT_CLINIC_OR_DEPARTMENT_OTHER): Payer: Federal, State, Local not specified - PPO | Admitting: Hematology

## 2017-08-14 ENCOUNTER — Other Ambulatory Visit: Payer: Self-pay | Admitting: *Deleted

## 2017-08-14 VITALS — BP 182/80 | HR 60 | Temp 100.8°F | Resp 18

## 2017-08-14 DIAGNOSIS — Z86718 Personal history of other venous thrombosis and embolism: Secondary | ICD-10-CM

## 2017-08-14 DIAGNOSIS — C210 Malignant neoplasm of anus, unspecified: Secondary | ICD-10-CM

## 2017-08-14 DIAGNOSIS — D630 Anemia in neoplastic disease: Secondary | ICD-10-CM | POA: Diagnosis present

## 2017-08-14 DIAGNOSIS — C786 Secondary malignant neoplasm of retroperitoneum and peritoneum: Secondary | ICD-10-CM | POA: Diagnosis present

## 2017-08-14 DIAGNOSIS — C7801 Secondary malignant neoplasm of right lung: Secondary | ICD-10-CM | POA: Diagnosis present

## 2017-08-14 DIAGNOSIS — Z9221 Personal history of antineoplastic chemotherapy: Secondary | ICD-10-CM

## 2017-08-14 DIAGNOSIS — K5900 Constipation, unspecified: Secondary | ICD-10-CM | POA: Diagnosis present

## 2017-08-14 DIAGNOSIS — R6510 Systemic inflammatory response syndrome (SIRS) of non-infectious origin without acute organ dysfunction: Secondary | ICD-10-CM | POA: Diagnosis present

## 2017-08-14 DIAGNOSIS — M1991 Primary osteoarthritis, unspecified site: Secondary | ICD-10-CM | POA: Diagnosis present

## 2017-08-14 DIAGNOSIS — R110 Nausea: Secondary | ICD-10-CM

## 2017-08-14 DIAGNOSIS — Z87891 Personal history of nicotine dependence: Secondary | ICD-10-CM

## 2017-08-14 DIAGNOSIS — C7989 Secondary malignant neoplasm of other specified sites: Secondary | ICD-10-CM | POA: Diagnosis present

## 2017-08-14 DIAGNOSIS — M25552 Pain in left hip: Secondary | ICD-10-CM

## 2017-08-14 DIAGNOSIS — I82409 Acute embolism and thrombosis of unspecified deep veins of unspecified lower extremity: Secondary | ICD-10-CM | POA: Diagnosis not present

## 2017-08-14 DIAGNOSIS — C218 Malignant neoplasm of overlapping sites of rectum, anus and anal canal: Secondary | ICD-10-CM | POA: Diagnosis present

## 2017-08-14 DIAGNOSIS — R112 Nausea with vomiting, unspecified: Secondary | ICD-10-CM | POA: Diagnosis present

## 2017-08-14 DIAGNOSIS — Z79899 Other long term (current) drug therapy: Secondary | ICD-10-CM | POA: Diagnosis not present

## 2017-08-14 DIAGNOSIS — R5381 Other malaise: Secondary | ICD-10-CM | POA: Diagnosis present

## 2017-08-14 DIAGNOSIS — C787 Secondary malignant neoplasm of liver and intrahepatic bile duct: Secondary | ICD-10-CM

## 2017-08-14 DIAGNOSIS — G629 Polyneuropathy, unspecified: Secondary | ICD-10-CM | POA: Diagnosis present

## 2017-08-14 DIAGNOSIS — Z7901 Long term (current) use of anticoagulants: Secondary | ICD-10-CM

## 2017-08-14 DIAGNOSIS — F419 Anxiety disorder, unspecified: Secondary | ICD-10-CM | POA: Diagnosis present

## 2017-08-14 DIAGNOSIS — C7802 Secondary malignant neoplasm of left lung: Secondary | ICD-10-CM | POA: Diagnosis present

## 2017-08-14 DIAGNOSIS — Z8673 Personal history of transient ischemic attack (TIA), and cerebral infarction without residual deficits: Secondary | ICD-10-CM

## 2017-08-14 DIAGNOSIS — R5081 Fever presenting with conditions classified elsewhere: Secondary | ICD-10-CM | POA: Diagnosis not present

## 2017-08-14 DIAGNOSIS — C772 Secondary and unspecified malignant neoplasm of intra-abdominal lymph nodes: Secondary | ICD-10-CM | POA: Diagnosis present

## 2017-08-14 DIAGNOSIS — Z923 Personal history of irradiation: Secondary | ICD-10-CM

## 2017-08-14 DIAGNOSIS — R509 Fever, unspecified: Secondary | ICD-10-CM

## 2017-08-14 DIAGNOSIS — R630 Anorexia: Secondary | ICD-10-CM

## 2017-08-14 DIAGNOSIS — Z66 Do not resuscitate: Secondary | ICD-10-CM | POA: Diagnosis present

## 2017-08-14 DIAGNOSIS — K219 Gastro-esophageal reflux disease without esophagitis: Secondary | ICD-10-CM | POA: Diagnosis present

## 2017-08-14 DIAGNOSIS — Z95828 Presence of other vascular implants and grafts: Secondary | ICD-10-CM

## 2017-08-14 DIAGNOSIS — E860 Dehydration: Secondary | ICD-10-CM

## 2017-08-14 LAB — CBC
HCT: 41 % (ref 36.0–46.0)
HEMOGLOBIN: 14.5 g/dL (ref 12.0–15.0)
MCH: 27.1 pg (ref 26.0–34.0)
MCHC: 35.4 g/dL (ref 30.0–36.0)
MCV: 76.6 fL — ABNORMAL LOW (ref 78.0–100.0)
Platelets: 315 10*3/uL (ref 150–400)
RBC: 5.35 MIL/uL — ABNORMAL HIGH (ref 3.87–5.11)
RDW: 15.9 % — AB (ref 11.5–15.5)
WBC: 12.6 10*3/uL — ABNORMAL HIGH (ref 4.0–10.5)

## 2017-08-14 LAB — COMPREHENSIVE METABOLIC PANEL
ALBUMIN: 4.2 g/dL (ref 3.5–5.0)
ALT: 14 U/L (ref 0–55)
ALT: 16 U/L (ref 14–54)
ANION GAP: 10 meq/L (ref 3–11)
ANION GAP: 9 (ref 5–15)
AST: 21 U/L (ref 5–34)
AST: 23 U/L (ref 15–41)
Albumin: 4.1 g/dL (ref 3.5–5.0)
Alkaline Phosphatase: 115 U/L (ref 40–150)
Alkaline Phosphatase: 91 U/L (ref 38–126)
BUN: 8.2 mg/dL (ref 7.0–26.0)
BUN: 8 mg/dL (ref 6–20)
CHLORIDE: 101 meq/L (ref 98–109)
CHLORIDE: 104 mmol/L (ref 101–111)
CO2: 28 meq/L (ref 22–29)
CO2: 25 mmol/L (ref 22–32)
Calcium: 9.7 mg/dL (ref 8.4–10.4)
Calcium: 8.8 mg/dL — ABNORMAL LOW (ref 8.9–10.3)
Creatinine, Ser: 0.6 mg/dL (ref 0.44–1.00)
Creatinine: 0.8 mg/dL (ref 0.6–1.1)
GFR calc Af Amer: >60 mL/min (ref >60)
GFR calc non Af Amer: >60 mL/min (ref >60)
GLUCOSE: 114 mg/dL — AB (ref 65–99)
Glucose: 108 mg/dl (ref 70–140)
POTASSIUM: 3.1 meq/L — AB (ref 3.5–5.1)
POTASSIUM: 3.7 mmol/L (ref 3.5–5.1)
SODIUM: 138 mmol/L (ref 135–145)
Sodium: 139 mEq/L (ref 136–145)
Total Bilirubin: 0.91 mg/dL (ref 0.20–1.20)
Total Bilirubin: 1.1 mg/dL (ref 0.3–1.2)
Total Protein: 8.2 g/dL (ref 6.4–8.3)
Total Protein: 7.6 g/dL (ref 6.5–8.1)

## 2017-08-14 LAB — CBC WITH DIFFERENTIAL/PLATELET
BASO%: 0.3 % (ref 0.0–2.0)
Basophils Absolute: 0 10*3/uL (ref 0.0–0.1)
EOS%: 0.1 % (ref 0.0–7.0)
Eosinophils Absolute: 0 10*3/uL (ref 0.0–0.5)
HCT: 45.2 % (ref 34.8–46.6)
HGB: 14.8 g/dL (ref 11.6–15.9)
LYMPH%: 11.6 % — AB (ref 14.0–49.7)
MCH: 26.2 pg (ref 25.1–34.0)
MCHC: 32.8 g/dL (ref 31.5–36.0)
MCV: 79.8 fL (ref 79.5–101.0)
MONO#: 1.5 10*3/uL — AB (ref 0.1–0.9)
MONO%: 11.4 % (ref 0.0–14.0)
NEUT#: 9.8 10*3/uL — ABNORMAL HIGH (ref 1.5–6.5)
NEUT%: 76.6 % (ref 38.4–76.8)
PLATELETS: 372 10*3/uL (ref 145–400)
RBC: 5.66 10*6/uL — AB (ref 3.70–5.45)
RDW: 16.6 % — ABNORMAL HIGH (ref 11.2–14.5)
WBC: 12.8 10*3/uL — ABNORMAL HIGH (ref 3.9–10.3)
lymph#: 1.5 10*3/uL (ref 0.9–3.3)

## 2017-08-14 LAB — LACTIC ACID, PLASMA
LACTIC ACID, VENOUS: 1.1 mmol/L (ref 0.5–1.9)
LACTIC ACID, VENOUS: 1 mmol/L (ref 0.5–1.9)

## 2017-08-14 LAB — LIPASE, BLOOD: LIPASE: 24 U/L (ref 11–51)

## 2017-08-14 MED ORDER — POLYETHYLENE GLYCOL 3350 17 G PO PACK: 17.0000 g | PACK | Freq: Every day | ORAL | Status: DC | PRN

## 2017-08-14 MED ORDER — SODIUM CHLORIDE 0.9 % IV SOLN
INTRAVENOUS | Status: DC
  Administered 2017-08-15 – 2017-08-19 (×6): via INTRAVENOUS

## 2017-08-14 MED ORDER — METOCLOPRAMIDE HCL 10 MG PO TABS
10.0000 mg | ORAL_TABLET | Freq: Three times a day (TID) | ORAL | Status: DC
  Administered 2017-08-14: 10 mg via ORAL
  Filled 2017-08-14 (×2): qty 1

## 2017-08-14 MED ORDER — SODIUM CHLORIDE 0.9 % IV SOLN: Freq: Once | INTRAVENOUS | Status: DC

## 2017-08-14 MED ORDER — ONDANSETRON HCL 4 MG PO TABS
4.0000 mg | ORAL_TABLET | Freq: Four times a day (QID) | ORAL | Status: DC | PRN
  Filled 2017-08-14: qty 1

## 2017-08-14 MED ORDER — DEXAMETHASONE SODIUM PHOSPHATE 10 MG/ML IJ SOLN
4.0000 mg | Freq: Once | INTRAMUSCULAR | Status: AC
  Administered 2017-08-14: 4 mg via INTRAVENOUS

## 2017-08-14 MED ORDER — PANTOPRAZOLE SODIUM 40 MG PO TBEC
40.0000 mg | DELAYED_RELEASE_TABLET | Freq: Every day | ORAL | Status: DC
  Administered 2017-08-15: 40 mg via ORAL
  Filled 2017-08-14: qty 1

## 2017-08-14 MED ORDER — ACETAMINOPHEN-CODEINE #3 300-30 MG PO TABS
1.0000 | ORAL_TABLET | Freq: Two times a day (BID) | ORAL | Status: DC | PRN
  Administered 2017-08-15 – 2017-08-16 (×2): 1 via ORAL
  Filled 2017-08-14 (×2): qty 1

## 2017-08-14 MED ORDER — PROMETHAZINE HCL 25 MG/ML IJ SOLN
12.5000 mg | Freq: Four times a day (QID) | INTRAMUSCULAR | Status: DC | PRN
  Administered 2017-08-15 – 2017-08-16 (×4): 12.5 mg via INTRAVENOUS
  Filled 2017-08-14 (×4): qty 1

## 2017-08-14 MED ORDER — DEXAMETHASONE SODIUM PHOSPHATE 10 MG/ML IJ SOLN
INTRAMUSCULAR | Status: AC
  Filled 2017-08-14: qty 1

## 2017-08-14 MED ORDER — LIDOCAINE-PRILOCAINE 2.5-2.5 % EX CREA: 1.0000 "application " | TOPICAL_CREAM | CUTANEOUS | Status: DC | PRN

## 2017-08-14 MED ORDER — ACETAMINOPHEN 650 MG RE SUPP: 650.0000 mg | Freq: Four times a day (QID) | RECTAL | Status: DC | PRN

## 2017-08-14 MED ORDER — DRONABINOL 5 MG PO CAPS
5.0000 mg | ORAL_CAPSULE | Freq: Two times a day (BID) | ORAL | Status: DC
  Administered 2017-08-15 – 2017-08-19 (×7): 5 mg via ORAL
  Filled 2017-08-14 (×10): qty 1

## 2017-08-14 MED ORDER — ONDANSETRON HCL 4 MG/2ML IJ SOLN
INTRAMUSCULAR | Status: AC
  Filled 2017-08-14: qty 4

## 2017-08-14 MED ORDER — ACETAMINOPHEN 325 MG PO TABS: 650.0000 mg | ORAL_TABLET | Freq: Four times a day (QID) | ORAL | Status: DC | PRN

## 2017-08-14 MED ORDER — ONDANSETRON HCL 4 MG/2ML IJ SOLN
8.0000 mg | Freq: Once | INTRAMUSCULAR | Status: AC
  Administered 2017-08-14: 8 mg via INTRAVENOUS

## 2017-08-14 MED ORDER — SODIUM CHLORIDE 0.9 % IV SOLN
Freq: Once | INTRAVENOUS | Status: AC
  Administered 2017-08-14: 16:00:00 via INTRAVENOUS

## 2017-08-14 MED ORDER — RIVAROXABAN 20 MG PO TABS
20.0000 mg | ORAL_TABLET | Freq: Every day | ORAL | Status: DC
  Administered 2017-08-14 – 2017-08-17 (×4): 20 mg via ORAL
  Filled 2017-08-14 (×4): qty 1

## 2017-08-14 MED ORDER — TRAMADOL HCL 50 MG PO TABS
50.0000 mg | ORAL_TABLET | Freq: Four times a day (QID) | ORAL | Status: DC | PRN
  Administered 2017-08-15: 50 mg via ORAL
  Filled 2017-08-14: qty 1

## 2017-08-14 MED ORDER — ONDANSETRON HCL 4 MG/2ML IJ SOLN
4.0000 mg | Freq: Four times a day (QID) | INTRAMUSCULAR | Status: DC | PRN
  Administered 2017-08-15 – 2017-08-16 (×4): 4 mg via INTRAVENOUS
  Filled 2017-08-14 (×4): qty 2

## 2017-08-14 MED ORDER — HYDRALAZINE HCL 20 MG/ML IJ SOLN
10.0000 mg | Freq: Four times a day (QID) | INTRAMUSCULAR | Status: DC | PRN
  Administered 2017-08-14 – 2017-08-16 (×2): 10 mg via INTRAVENOUS
  Filled 2017-08-14 (×4): qty 0.5

## 2017-08-14 NOTE — H&P (Addendum)
History and Physical    [name]:[id number] DOB: 1956-03-29 DOA: 08/14/2017  Referring MD/NP/PA: Dr.Feng PCP:  Patient coming from: [place]  Chief Complaint: malaise/fever HPI: [name] is a 61 y.o. female with medical history significant of metastatic rectal cancer with metastasis to lymph nodes, liver, lungs on immunotherapy with Nivolumab(Opdivo) presents to [facility] hospital as a direct admission from [place]. History primarily provided by patient's daughter reports that she received her last immunotherapy dose   a week ago on 8/15 following which she had some malaise and constipation, constipation resolved after Miralax, did okay over the weekend and subsequently on Tuesday had increased nausea and constipation requiring miralax and mag citrate, after this started having multiple bowel movements and then on tuesday night had multiple episodes of vomiting after which her family brought her to the emergency room she had an unremarkable workup with labs and a CT scan which was negative for any acute findings and subsequently discharged home yesterday. She presented to [facility] was seen by Dr. Morey Hummingbird due to increasing malaise weakness, vomiting and low-grade fever of 100.8 she did labs and requested direct admission to [facility] service. Patient denies any abdominal pain except during the time when she was constipated but none at this time her primary symptoms at this time are malaise, nausea and vomiting and low grade fever.  Review of Systems: As per HPI otherwise 14 point review of systems negative.   Past Medical History:  Diagnosis Date  . Anxiety   . Arthritis   . Asthma    childhood   . Cancer [facility])    colorectal cancer with chemo and radiation   . DVT (deep venous thrombosis) (HCC)    left lower extremity  . Dyspnea    with anxiety and exertion  . GERD (gastroesophageal reflux disease)   . Neuromuscular disorder (HCC)     poly neuropathy   . Stroke [facility]) 2010, 2011   TIA  x2    Past Surgical History:  Procedure Laterality Date  . CESAREAN SECTION    . FLEXIBLE SIGMOIDOSCOPY N/A 04/17/2017   Procedure: FLEXIBLE SIGMOIDOSCOPY;  Surgeon: Ronald Lobo, MD;  Location: [facility] END[facility];  Service: Endoscopy;  Laterality: N/A;  . IR FLUORO GUIDE PORT INSERTION LEFT  06/04/2017  . IR REMOVAL TUN ACCESS W/ PORT W/O FL MOD SED  06/04/2017  . IR US GUIDE VASC ACCESS LEFT  06/04/2017  . IR VENO/EXT/UNI LEFT  06/04/2017  . THYROID CYST EXCISION    . TOTAL HIP ARTHROPLASTY Left 03/25/2017   Procedure: LEFT TOTAL HIP ARTHROPLASTY ANTERIOR APPROACH;  Surgeon: Paralee Cancel, MD;  Location: [facility] ORS;  Service: Orthopedics;  Laterality: Left;  Requests 70 mins     reports that she has quit smoking. Her smoking use included Cigarettes. She has never used smokeless tobacco. She reports that she does not drink alcohol or use drugs.  Allergies  Allergen Reactions  . Ativan [Lorazepam] Other (See Comments)    Loss of memory and change in behavior.  . Penicillins Itching    Has patient had a PCN reaction causing immediate rash, facial/tongue/throat swelling, SOB or lightheadedness with hypotension: Unknown Has patient had a PCN reaction causing severe rash involving mucus membranes or skin necrosis: Unknown Has patient had a PCN reaction that required hospitalization Unknown Has patient had a PCN reaction occurring within the last 10 years: Unknown Daughter states pt cant take any derivative of penicillin either    Family History  Problem Relation Age of Onset  . Hypertension Mother   . Dementia Mother      Prior to Admission medications   Medication Sig Start Date End Date Taking? Authorizing Provider  acetaminophen-codeine (TYLENOL #3) 300-30 MG tablet Take 1 tablet by mouth every 12 (twelve) hours as needed for moderate pain. Patient taking differently: Take 0.5 tablets by mouth daily.  07/15/17   Truitt Merle, MD  docusate  sodium (COLACE) 100 MG capsule Take 1 capsule (100 mg total) by mouth 2 (two) times daily. 03/25/17   Danae Orleans, PA-C  dronabinol (MARINOL) 5 MG capsule Take 1 capsule (5 mg total) by mouth 2 (two) times daily before a meal. 07/10/17   Truitt Merle, MD  LACTULOSE PO Take 1 capsule by mouth as needed (constipation).     [provider]  lidocaine-prilocaine (EMLA) cream Apply 1 application topically as needed. Patient taking differently: Apply 1 application topically as needed (for port).  05/16/17   Truitt Merle, MD  loperamide (IMODIUM A-D) 2 MG tablet Take 1 tablet (2 mg total) by mouth 4 (four) times daily as needed for diarrhea or loose stools. 08/13/17   Fredia Sorrow, MD  magnesium citrate SOLN Take 1 Bottle by mouth once.    [provider]  methocarbamol (ROBAXIN) 500 MG tablet Take 1 tablet (500 mg total) by mouth every 6 (six) hours as needed for muscle spasms. 03/25/17   Danae Orleans, PA-C  metoCLOPramide (REGLAN) 10 MG tablet Take 1 tablet (10 mg total) by mouth 4 (four) times daily -  before meals and at bedtime. 06/27/17   Truitt Merle, MD  omeprazole (PRILOSEC) 20 MG capsule Take 20 mg by mouth daily.    [provider]  ondansetron (ZOFRAN ODT) 4 MG disintegrating tablet Take 1 tablet (4 mg total) by mouth every 8 (eight) hours as needed for nausea or vomiting. 08/13/17   Fredia Sorrow, MD  ondansetron (ZOFRAN ODT) 8 MG disintegrating tablet Take 1 tablet (8 mg total) by mouth every 8 (eight) hours as needed for nausea or vomiting. 07/23/17   Truitt Merle, MD  polyethylene glycol [facility]) packet Take 17 g by mouth 2 (two) times daily. 03/25/17   Danae Orleans, PA-C  promethazine (PHENERGAN) 25 MG tablet Take 1 tablet (25 mg total) by mouth every 6 (six) hours as needed for nausea or vomiting. 08/13/17   Fredia Sorrow, MD  rivaroxaban (XARELTO) 20 MG TABS tablet Take 1 tablet (20 mg total) by mouth daily with supper. Please follow up with PCP to continue  this medication. 03/27/17   Danae Orleans, PA-C  saccharomyces boulardii (FLORASTOR) 250 MG capsule Take 250 mg by mouth daily.    [provider]  traMADol (ULTRAM) 50 MG tablet Take 1 tablet (50 mg total) by mouth every 6 (six) hours as needed. Patient taking differently: Take 50 mg by mouth every 6 (six) hours as needed for moderate pain.  07/10/17   Truitt Merle, MD    Physical Exam: Vitals:   08/14/17 1750  BP: (!) 187/80  Pulse: (!) 57  Resp: (!) 24  Temp: 100.3 F (37.9 C)  TempSrc: Oral  SpO2: 98%      Constitutional: ill appearing female, non toxic, uncomfortable Vitals:   08/14/17 1750  BP: (!) 187/80  Pulse: (!) 57  Resp: (!) 24  Temp: 100.3 F (37.9 C)  TempSrc: Oral  SpO2: 98%   Eyes: PERRL, lids and conjunctivae normal ENMT: Mucous membranes are dry.   Neck:  normal, supple, Respiratory: CTAB, slightly decreased at the bases Cardiovascular: Regular rate and rhythm, no murmurs Abdomen: very soft, no tenderness, no rigidity or rebound, no fluid thrill. Bowel sounds positive.  Musculoskeletal: no clubbing / cyanosis.  Skin: no rashes, lesions, ulcers. No induration Neurologic:Moves all extremities, no localizing signs  Psychiatric:Very flat affect   Labs on Admission: I have personally reviewed following labs and imaging studies  CBC:  Recent Labs Lab 08/13/17 0354 08/14/17 1601  WBC 10.6* 12.8*  NEUTROABS  --  9.8*  HGB 14.0 14.8  HCT 40.1 45.2  MCV 77.1* 79.8  PLT 325 423   Basic Metabolic Panel:  Recent Labs Lab 08/13/17 0354 08/14/17 1601  NA 140 139  K 3.5 3.1*  CL 104  --   CO2 26 28  GLUCOSE 180* 108  BUN 5* 8.2  CREATININE 0.62 0.8  CALCIUM 9.3 9.7   GFR: Estimated Creatinine Clearance: 62.9 mL/min (by C-G formula based on SCr of 0.8 mg/dL). Liver Function Tests:  Recent Labs Lab 08/13/17 0354 08/14/17 1601  AST 17 21  ALT 10* 14  ALKPHOS 98 115  BILITOT 0.6 0.91  PROT 7.5 8.2  ALBUMIN 4.1 4.1    Recent  Labs Lab 08/13/17 0353  LIPASE 20   No results for input(s): AMMONIA in the last 168 hours. Coagulation Profile: No results for input(s): INR, PROTIME in the last 168 hours. Cardiac Enzymes: No results for input(s): CKTOTAL, CKMB, CKMBINDEX, TROPONINI in the last 168 hours. BNP (last 3 results) No results for input(s): PROBNP in the last 8760 hours. HbA1C: No results for input(s): HGBA1C in the last 72 hours. CBG: No results for input(s): GLUCAP in the last 168 hours. Lipid Profile: No results for input(s): CHOL, HDL, LDLCALC, TRIG, CHOLHDL, LDLDIRECT in the last 72 hours. Thyroid Function Tests: No results for input(s): TSH, T4TOTAL, FREET4, T3FREE, THYROIDAB in the last 72 hours. Anemia Panel: No results for input(s): VITAMINB12, FOLATE, FERRITIN, TIBC, IRON, RETICCTPCT in the last 72 hours. Urine analysis:    Component Value Date/Time   COLORURINE YELLOW 08/13/2017 0630   APPEARANCEUR HAZY (A) 08/13/2017 0630   LABSPEC 1.009 08/13/2017 0630   PHURINE 8.0 08/13/2017 0630   GLUCOSEU 50 (A) 08/13/2017 0630   HGBUR SMALL (A) 08/13/2017 0630   BILIRUBINUR NEGATIVE 08/13/2017 0630   KETONESUR 80 (A) 08/13/2017 0630   PROTEINUR 30 (A) 08/13/2017 0630   NITRITE NEGATIVE 08/13/2017 0630   LEUKOCYTESUR TRACE (A) 08/13/2017 0630   Sepsis Labs: @LABRCNTIP (procalcitonin:4,lacticidven:4) )No results found for this or any previous visit (from the past 240 hour(s)).   Radiological Exams on Admission: Ct Abdomen Pelvis W Contrast  Result Date: 08/13/2017 CLINICAL DATA:  Nausea, vomiting, bowel obstruction EXAM: CT ABDOMEN AND PELVIS WITH CONTRAST TECHNIQUE: Multidetector CT imaging of the abdomen and pelvis was performed using the standard protocol following bolus administration of intravenous contrast. CONTRAST:  164mL ISOVUE-300 IOPAMIDOL (ISOVUE-300) INJECTION 61% COMPARISON:  Plain films 08/13/2017.  PET CT 08/01/2017 FINDINGS: Lower chest: Lung bases are clear. No effusions. Heart  is normal size. Hepatobiliary: Hepatic metastases again noted as seen on recent PET CT. Index left hepatic lesion measures up to 3.1 cm, stable. Remainder the hepatic metastases appear stable. Right hepatic cysts also noted, stable. No biliary ductal dilatation. Gallbladder unremarkable. Pancreas: No focal abnormality or ductal dilatation. Spleen: No focal abnormality.  Normal size. Adrenals/Urinary Tract: Left renal parapelvic cysts. No hydronephrosis. Adrenal glands and urinary bladder are unremarkable. Stomach/Bowel: Abnormal wall thickening within the rectum with large  posterior wall rectal mass on image 60 measuring 3.2 cm, stable since prior study. There is presacral soft tissue thickening, stable. No evidence of bowel obstruction. Vascular/Lymphatic: Pathologic retroperitoneal adenopathy again noted. Index bulky left retroperitoneal adenopathy measures 4 x 3.3 cm, stable. No evidence of aneurysm. Reproductive: Uterus and adnexa unremarkable.  No mass. Other: No free fluid or free air. Musculoskeletal: Prior left hip replacement. No acute bony abnormality. IMPRESSION: Stable rectal wall thickening and posterior rectal wall mass compatible with patient's history of rectal cancer. Stable hepatic metastases and bulky retroperitoneal metastases. No evidence of bowel obstruction. Electronically Signed   By: Rolm Baptise M.D.   On: 08/13/2017 10:51   Dg Abdomen Acute W/chest  Result Date: 08/13/2017 CLINICAL DATA:  Abdominal pain and constipation.  Vomiting. EXAM: DG ABDOMEN ACUTE W/ 1V CHEST COMPARISON:  Head CT 08/01/2017 FINDINGS: Tip of the left chest port in the mid SVC. Normal heart size and mediastinal contours. No consolidation, pleural fluid or pneumothorax. Mild gaseous distention and air-fluid level within the transverse and descending colon. No significant formed stool is seen. No bowel dilatation to suggest obstruction. No free air. Pelvic phleboliths. No radiopaque calculi. Left hip arthroplasty.  IMPRESSION: 1. Air-fluid levels within the transverse and descending colon, consistent with liquid stool. No significant formed stool. No dilated bowel loops to suggest obstruction. 2.  No acute pulmonary process. Electronically Signed   By: Jeb Levering M.D.   On: 08/13/2017 06:57    EKG:. pending  Assessment/Plan Principal Problem:   Fever/SIRS -Etiology not clear at this time, could be of viral illness / tumor fever/ induced by immunotherapy -She had a CT Abd yesterday 8/22 which was negative for any acute abdominal findings and abdominal exam is pretty benign at this time  -We'll check blood cultures , she has a Port-A-Cath  -Check chest x-ray and urinalysis/LFTs/ lipase/Lactate  -Supportive care with IV fluids antiemetics as needed  -I will hold off on antibiotic therapy at this time since she is hemodynamically stable and no clear focus      History of DVT -Continue xarelto    Nausea & vomiting -Acute on chronic issue  -Workup as noted above    Metastatic anal cancer  -On palliative immunotherapy  -Dr.Feng will FU  DVT prophylaxis: on xarelto Code Status: DNR  Family Communication:  Daughter at bedside Disposition Plan: Home in improved Consults called: None  Admission status: inpatient   Domenic Polite MD [facility] Pager [phone]  If 7PM-7AM, please contact night-coverage [email or web address] Password [facility]  08/14/2017, 6:08 PM

## 2017-08-14 NOTE — Progress Notes (Signed)
[place]  [phone] [phone]) [phone]  Clinic Follow up Note   Patient Care Team: Damaris Hippo, MD as PCP - General (Family Medicine) 08/14/2017   CHIEF COMPLAINS: nausea, constipation/diarrhea and low appetite   SUMMARY OF ONCOLOGIC HISTORY: Oncology History   cT2N2M0 stage IIIB      Anal cancer ([place])   03/09/2015 Initial Diagnosis    Rectal cancer ([place])      04/19/2015 Imaging    PET scan showed hypermetabolic rectal lesion and inguinal lymph nodes      05/08/2015 - 07/13/2015 Radiation Therapy    59 Gy in 35 fractions to pelvis and inguinal LNs for her anal cancer. She tolerated treatment poorly with persistent nausea, vomiting, diarrhea and a poor appetite throughout treatment, and required several hospitalization and treatment delays. She did complete the planned treatment.       05/08/2015 - 06/2015 Chemotherapy    Concurrent chemotherapy with 5-FU and mitomycin along with radiation. Pt tolerated poorly.       01/08/2016 Imaging    PET scan showed you hypermetabolic left mediastinal 1L adenopathy (1.5cm), partial response to therapy with interval decrease in extent of hypermetabolic rectal soft tissue fullness as well as resolution of inguinal adenopathy.      02/06/2017 Procedure    Colonoscopy showed scar tissue just off anal verge, no lesion otherwise.      04/16/2017 - 04/23/2017 Hospital Admission    Admitted 04/16/17 - 04/23/17 for Nausea and Vomiting and abdominal pain. CT abd noted to have findings including rectal mass and enlarged nodes. Patient underwent sigmoidoscopy with biopsy showing benign findings. IR was consulted for node biopsy and which was done and awaiting results.      04/17/2017 Imaging    CT AP W Contrast 04/17/17: IMPRESSION: 1. Left posterior perirectal lobulated mass concerning for rectal malignancy. A smaller nodular density superior to this mass likely represent a mildly enlarged metastatic node. MRI is  recommended for further evaluation. 2. 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 A hypodense lesion to the left of the L5 posterior to the psoas muscle is also consistent with metastatic disease. 3. Multiple hepatic hypodense lesions, incompletely characterized, possibly metastatic. MRI is recommended for further evaluation. 4. No evidence of bowel obstruction or active inflammation. Normal appendix.      04/17/2017 Pathology Results    Diagnosis Rectum, biopsy, distal - BENIGN COLONIC MUCOSA WITH EROSIONS AND ULCERATION, SEE COMMENT. - NO DYSPLASIA OR MALIGNANCY.       04/22/2017 Pathology Results    Diagnosis Lymph node, needle/core biopsy, left retroperitoneum periaortic - POORLY DIFFERENTIATED SQUAMOUS CELL CARCINOMA - SEE COMMENT Microscopic Comment The needle core biopsy has a basaloid neoplasm with increased mitoses and necrosis. There is no residual lymph node parenchyma identified. The neoplasm is immunoreactive for cytokeratin 7 (patchy), cytokeratin 5/6, and p16 but negative for CDX2 and cytokeratin 20. Overall, the histology and immunophenotype support the diagnosis of a poorly differentiated squamous cell carcinoma.       05/13/2017 PET scan    IMPRESSION: 1. Perirectal adenopathy and posterior perirectal hypermetabolic mass, associated with presumed metastatic adenopathy in knee left common iliac, retroperitoneal, thoracic paraesophageal, and left upper mediastinal regions. The left upper mediastinal mass extends into the left supraclavicular space. 2. Metastatic disease to the liver (best seen in segment 3) along with a hypermetabolic nodule compatible with metastatic disease in the superior segment of the lower lobe of the left lung.  05/16/2017 -  Antibody Plan     Nivolumab 240mg  every 2 weeks started on 05/16/17       08/01/2017 PET scan    PET 08/01/17 IMPRESSION: 1. Mixed appearance of  response, with significant improvement in the thoracic metastatic disease ; mild improvement in the abdominal adenopathy ; new and worsening metastatic disease in the liver; and a relatively stable left posterior perirectal mass. 2. Other imaging findings of potential clinical significance: Aortic Atherosclerosis (ICD10-I70.0). Prominent stool throughout the colon favors constipation.       08/13/2017 Imaging    CT Abdomen/Pelvis w contrast IMPRESSION: Stable rectal wall thickening and posterior rectal wall mass compatible with patient's history of rectal cancer.  Stable hepatic metastases and bulky retroperitoneal metastases.  No evidence of bowel obstruction.      08/14/2017 -  Hospital Admission    presented to the ED yesterday with constipation, nausea, abdominal discomfort and vomiting episodes      History of Present Illness (04/17/17) [name] is a 61 y.o. 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 I was called to see her in hospital.   She is from [place], with past medical history of DVT on Xarelto, rectal cancer status post chemotherapy and radiation in 2016, stroke, asthma, who was admitted to hospital last night for nausea, vomiting and abdominal pain. CT of abdomen and pelvis showed left posterior perirectal lobulated mass concerning for rectum malignancy. Retroperitoneal adenopathy, I hypodense lesion to the left L5 to the sore S muscle, suspicious for metastasis, and multiple hepatic hypodense lesions, indeterminate. I was called to see the patient to ruled out metastatic rectal cancer recurrence. I saw the patient before her endoscopy today.  The patient reports she has a prior history of rectal cancer stage 3, squamous cell, which was treated with chemotherapy and radiation therapy. She did not have surgery. The patient reports she completed treatment in September 2016. The patient has not used  her port since 2016, though it is still in place. She reports the last time she followed up with physicians for her prior cancer, they told her she had suspicious masses in her lungs and liver.  The patient does not have any medical records from [place], where she previously lived and was treated, but reports her daughter would have them. She reports she does not know if she plans to stay in [place], or if she will return to [place].    Current Treatment:  Nivolumab 240mg  every 2 weeks started on 05/16/17   INTERVAL HISTORY:  Shelie Lansing is here for an urgent visit. She presents to the clinic today with her daughter to receive IVF. She presented to the ED yesterday with constipation, nausea, abdominal discomfort and vomiting episodes. She has a fever, onset today. Since her last visit, she was doing well. Per her daughter, she was very constipated starting on Monday. Her daughter gave her Magnesium citrate and miraLAX mixed with prune juice. After, she has extreme diarrhea. She is not able to keep any of her food down. She has taken her prescribed nausea medication and threw it up. She has some phlegm cough up when she vomits. Denies any burning when urinates. Her daughter says it has been "extremely clear." She has some chills when she throws up.   REVIEW OF SYSTEMS:   Constitutional: Denies abnormal weight loss (+)still overall fatigued, fever (+)chills when vomits Eyes: Denies blurriness of vision Ears, nose, mouth, throat, and  face: Denies mucositis or sore throat  Respiratory: Denies cough, dyspnea or wheezes Cardiovascular: Denies palpitation, chest discomfort or lower extremity swelling Gastrointestinal:  Denies heartburn  (+) nausea (+) diarrhea (+)vomiting, with occasional phlegm cough up Skin: Denies abnormal skin rashes Lymphatics: Denies new lymphadenopathy or easy bruising Neurological:Denies numbness, tingling or new weaknesses MSK: (+) occasional  hip pain from  surgery Behavioral/Psych: Mood is stable, no new changes  All other systems were reviewed with the patient and are negative.  MEDICAL HISTORY:  Past Medical History:  Diagnosis Date  . Anxiety   . Arthritis   . Asthma    childhood   . Cancer [facility])    colorectal cancer with chemo and radiation   . DVT (deep venous thrombosis) ([facility])    left lower extremity  . Dyspnea    with anxiety and exertion  . GERD (gastroesophageal reflux disease)   . Neuromuscular disorder ([facility])    poly neuropathy   . Stroke [facility]) 2010, 2011   TIA  x2    SURGICAL HISTORY: Past Surgical History:  Procedure Laterality Date  . CESAREAN SECTION    . FLEXIBLE SIGMOIDOSCOPY N/A 04/17/2017   Procedure: FLEXIBLE SIGMOIDOSCOPY;  Surgeon: Ronald Lobo, MD;  Location: [facility] END[facility]COPY;  Service: Endoscopy;  Laterality: N/A;  . IR FLUORO GUIDE PORT INSERTION LEFT  06/04/2017  . IR REMOVAL TUN ACCESS W/ PORT W/O FL MOD SED  06/04/2017  . IR US GUIDE VASC ACCESS LEFT  06/04/2017  . IR VENO/EXT/UNI LEFT  06/04/2017  . THYROID CYST EXCISION    . TOTAL HIP ARTHROPLASTY Left 03/25/2017   Procedure: LEFT TOTAL HIP ARTHROPLASTY ANTERIOR APPROACH;  Surgeon: Paralee Cancel, MD;  Location: [facility] ORS;  Service: Orthopedics;  Laterality: Left;  Requests 70 mins    I have reviewed the social history and family history with the patient and they are unchanged from previous note.  ALLERGIES:  is allergic to ativan [lorazepam] and penicillins.  MEDICATIONS:  Current Outpatient Prescriptions  Medication Sig Dispense Refill  . acetaminophen-codeine (TYLENOL #3) 300-30 MG tablet Take 1 tablet by mouth every 12 (twelve) hours as needed for moderate pain. (Patient taking differently: Take 0.5 tablets by mouth daily. ) 40 tablet 0  . docusate sodium (COLACE) 100 MG capsule Take 1 capsule (100 mg total) by mouth 2 (two) times daily. 10 capsule 0  . dronabinol (MARINOL) 5 MG capsule Take 1 capsule (5 mg total) by mouth 2 (two) times daily before a  meal. 60 capsule 0  . LACTULOSE PO Take 1 capsule by mouth as needed (constipation).     Marland Kitchen lidocaine-prilocaine (EMLA) cream Apply 1 application topically as needed. (Patient taking differently: Apply 1 application topically as needed (for port). ) 30 g 6  . loperamide (IMODIUM A-D) 2 MG tablet Take 1 tablet (2 mg total) by mouth 4 (four) times daily as needed for diarrhea or loose stools. 30 tablet 0  . magnesium citrate SOLN Take 1 Bottle by mouth once.    . methocarbamol (ROBAXIN) 500 MG tablet Take 1 tablet (500 mg total) by mouth every 6 (six) hours as needed for muscle spasms. 40 tablet 0  . metoCLOPramide (REGLAN) 10 MG tablet Take 1 tablet (10 mg total) by mouth 4 (four) times daily -  before meals and at bedtime. 120 tablet 0  . omeprazole (PRILOSEC) 20 MG capsule Take 20 mg by mouth daily.    . ondansetron (ZOFRAN ODT) 4 MG disintegrating tablet Take 1 tablet (4 mg total) by  mouth every 8 (eight) hours as needed for nausea or vomiting. 12 tablet 1  . ondansetron (ZOFRAN ODT) 8 MG disintegrating tablet Take 1 tablet (8 mg total) by mouth every 8 (eight) hours as needed for nausea or vomiting. 60 tablet 2  . polyethylene glycol (MIRALAX / GLYCOLAX) packet Take 17 g by mouth 2 (two) times daily. 14 each 0  . promethazine (PHENERGAN) 25 MG tablet Take 1 tablet (25 mg total) by mouth every 6 (six) hours as needed for nausea or vomiting. 12 tablet 1  . rivaroxaban (XARELTO) 20 MG TABS tablet Take 1 tablet (20 mg total) by mouth daily with supper. Please follow up with PCP to continue this medication. 30 tablet 0  . saccharomyces boulardii (FLORASTOR) 250 MG capsule Take 250 mg by mouth daily.    . traMADol (ULTRAM) 50 MG tablet Take 1 tablet (50 mg total) by mouth every 6 (six) hours as needed. (Patient taking differently: Take 50 mg by mouth every 6 (six) hours as needed for moderate pain. ) 30 tablet 0   No current facility-administered medications for this visit.     PHYSICAL EXAMINATION:    ECOG PERFORMANCE STATUS: 3 BP (!) 182/80 (BP Location: Right Arm, Patient Position: Sitting)   Pulse 60   Temp (!) 100.8 F (38.2 C) (Oral)   Resp 18   SpO2 98%   GENERAL:alert, moderately distress due to nausea and abdominal discomfort  SKIN: skin color, texture, turgor are normal, no rashes or significant lesions  EYES: normal, Conjunctiva are pink and non-injected, sclera clear OROPHARYNX:no exudate, no erythema and lips, buccal mucosa, and tongue normal  NECK: supple, thyroid normal size, non-tender, without nodularity LYMPH:  no palpable lymphadenopathy in the cervical, axillary or inguinal LUNGS: clear to auscultation and percussion with normal breathing effort HEART: regular rate & rhythm and no murmurs and no lower extremity edema ABDOMEN:abdomen soft, mild diffuse tenderness, normal bowel sounds Musculoskeletal:no cyanosis of digits and no clubbing  NEURO: alert & oriented x 3 with fluent speech, no focal motor/sensory deficits  LABORATORY DATA:  I have reviewed the data as listed CBC Latest Ref Rng & Units 08/14/2017 08/13/2017 08/06/2017  WBC 3.9 - 10.3 10e3/uL 12.8(H) 10.6(H) 6.5  Hemoglobin 11.6 - 15.9 g/dL 14.8 14.0 12.8  Hematocrit 34.8 - 46.6 % 45.2 40.1 39.7  Platelets 145 - 400 10e3/uL 372 325 318     CMP Latest Ref Rng & Units 08/13/2017 08/06/2017 07/26/2017  Glucose 65 - 99 mg/dL 180(H) 112 80  BUN 6 - 20 mg/dL 5(L) 8.4 9  Creatinine 0.44 - 1.00 mg/dL 0.62 0.8 0.45  Sodium 135 - 145 mmol/L 140 143 141  Potassium 3.5 - 5.1 mmol/L 3.5 3.8 3.1(L)  Chloride 101 - 111 mmol/L 104 - 111  CO2 22 - 32 mmol/L 26 27 23   Calcium 8.9 - 10.3 mg/dL 9.3 9.4 7.7(L)  Total Protein 6.5 - 8.1 g/dL 7.5 7.0 -  Total Bilirubin 0.3 - 1.2 mg/dL 0.6 0.28 -  Alkaline Phos 38 - 126 U/L 98 101 -  AST 15 - 41 U/L 17 12 -  ALT 14 - 54 U/L 10(L) <6 -   PATHOLOGY:   Pathology Results: 04/22/17 Diagnosis Lymph node, needle/core biopsy, left retroperitoneum periaortic - POORLY  DIFFERENTIATED SQUAMOUS CELL CARCINOMA - SEE COMMENT Microscopic Comment The needle core biopsy has a basaloid neoplasm with increased mitoses and necrosis. There is no residual lymph node parenchyma identified. The neoplasm is immunoreactive for cytokeratin 7 (patchy), cytokeratin 5/6, and  p16 but negative for CDX2 and cytokeratin 20. Overall, the histology and immunophenotype support the diagnosis of a poorly differentiated squamous cell carcinoma.  Surgical Pathology 04/17/2017 Diagnosis Rectum, biopsy, distal - BENIGN COLONIC MUCOSA WITH EROSIONS AND ULCERATION, SEE COMMENT. - NO DYSPLASIA OR MALIGNANCY. Microscopic Comment There is lamina propria fibrosis and macrophages consistent with prior treatment. The case was called to Dr. Cristina Gong on 04/18/2017.  RADIOGRAPHIC STUDIES: I have personally reviewed the radiological images as listed and agreed with the findings in the report. Ct Abdomen Pelvis W Contrast  Result Date: 08/13/2017 CLINICAL DATA:  Nausea, vomiting, bowel obstruction EXAM: CT ABDOMEN AND PELVIS WITH CONTRAST TECHNIQUE: Multidetector CT imaging of the abdomen and pelvis was performed using the standard protocol following bolus administration of intravenous contrast. CONTRAST:  142mL ISOVUE-300 IOPAMIDOL (ISOVUE-300) INJECTION 61% COMPARISON:  Plain films 08/13/2017.  PET CT 08/01/2017 FINDINGS: Lower chest: Lung bases are clear. No effusions. Heart is normal size. Hepatobiliary: Hepatic metastases again noted as seen on recent PET CT. Index left hepatic lesion measures up to 3.1 cm, stable. Remainder the hepatic metastases appear stable. Right hepatic cysts also noted, stable. No biliary ductal dilatation. Gallbladder unremarkable. Pancreas: No focal abnormality or ductal dilatation. Spleen: No focal abnormality.  Normal size. Adrenals/Urinary Tract: Left renal parapelvic cysts. No hydronephrosis. Adrenal glands and urinary bladder are unremarkable. Stomach/Bowel: Abnormal wall  thickening within the rectum with large posterior wall rectal mass on image 60 measuring 3.2 cm, stable since prior study. There is presacral soft tissue thickening, stable. No evidence of bowel obstruction. Vascular/Lymphatic: Pathologic retroperitoneal adenopathy again noted. Index bulky left retroperitoneal adenopathy measures 4 x 3.3 cm, stable. No evidence of aneurysm. Reproductive: Uterus and adnexa unremarkable.  No mass. Other: No free fluid or free air. Musculoskeletal: Prior left hip replacement. No acute bony abnormality. IMPRESSION: Stable rectal wall thickening and posterior rectal wall mass compatible with patient's history of rectal cancer. Stable hepatic metastases and bulky retroperitoneal metastases. No evidence of bowel obstruction. Electronically Signed   By: Rolm Baptise M.D.   On: 08/13/2017 10:51   Dg Abdomen Acute W/chest  Result Date: 08/13/2017 CLINICAL DATA:  Abdominal pain and constipation.  Vomiting. EXAM: DG ABDOMEN ACUTE W/ 1V CHEST COMPARISON:  Head CT 08/01/2017 FINDINGS: Tip of the left chest port in the mid SVC. Normal heart size and mediastinal contours. No consolidation, pleural fluid or pneumothorax. Mild gaseous distention and air-fluid level within the transverse and descending colon. No significant formed stool is seen. No bowel dilatation to suggest obstruction. No free air. Pelvic phleboliths. No radiopaque calculi. Left hip arthroplasty. IMPRESSION: 1. Air-fluid levels within the transverse and descending colon, consistent with liquid stool. No significant formed stool. No dilated bowel loops to suggest obstruction. 2.  No acute pulmonary process. Electronically Signed   By: Jeb Levering M.D.   On: 08/13/2017 06:57     PET 08/01/17 IMPRESSION: 1. Mixed appearance of response, with significant improvement in the thoracic metastatic disease ; mild improvement in the abdominal adenopathy ; new and worsening metastatic disease in the liver; and a relatively  stable left posterior perirectal mass. 2. Other imaging findings of potential clinical significance: Aortic Atherosclerosis (ICD10-I70.0). Prominent stool throughout the colon favors constipation.   PET Scan 05/13/2017 IMPRESSION: 1. Perirectal adenopathy and posterior perirectal hypermetabolic mass, associated with presumed metastatic adenopathy in knee left common iliac, retroperitoneal, thoracic paraesophageal, and left upper mediastinal regions. The left upper mediastinal mass extends into the left supraclavicular space. 2. Metastatic disease to the liver (  best seen in segment 3) along with a hypermetabolic nodule compatible with metastatic disease in the superior segment of the lower lobe of the left lung.   CT AP W Contrast 04/17/17: IMPRESSION: 1. Left posterior perirectal lobulated mass concerning for rectal malignancy. A smaller nodular density superior to this mass likely represent a mildly enlarged metastatic node. MRI is recommended for further evaluation. 2. 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 A hypodense lesion to the left of the L5 posterior to the psoas muscle is also consistent with metastatic disease. 3. Multiple hepatic hypodense lesions, incompletely characterized, possibly metastatic. MRI is recommended for further evaluation. 4. No evidence of bowel obstruction or active inflammation. Normal appendix.  ASSESSMENT & PLAN:  [name] is a 61 y.o. female from [place], with past medical history of DVT on Xarelto, rectal squamous cell carcinoma status post chemotherapy and radiation in 2016, stroke, asthma, who was admitted to hospital recently for nausea, vomiting and abdominal pain. She has had those symptoms since her recent hip surgery on 04/21/2017.   1. Fever, nausea, anorexia and BM change  -She initially had constipation, required multiple diuretics, subsequently developed diarrhea  since yesterday. She also has significant nausea, low appetite, and fatigue. She was found to have a fever in our clinic. -CT scan yesterday showed no signs of obstruction, UA was negative. -Lab review, she has mild leukocytosis, with predominant lymphocytosis -She received IV fluids and antiemetics with Zofran and dexamethasone 4mg  -I suspect this could be some sort of infection, possible virus vs GI source -she has been on Nivolumab, which is a immunotherapy drug, she is not neutropenic, she does not have suppressed immune system, not at high risk for opportunistic infection. -I recommend hospital admission. I have spoken with hospitalist Dr. Broadus John, she has kindly agreed to admit her.  2. 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 -We reviewed her recent CT scan findings, and her retroperitoneal lymph node biopsy results in details with patient and her daughter. -Unfortunately, biopsy confirmed metastatic disease to abdominal lymph nodes. Her CT scan is also suspicious for liver and pelvic metastasis. She was not able to tolerate MRI to evaluate her liver lesions. -We discussed the incurable nature of her disease at this stage, and the goal of therapy is palliative. -I previously discussed her staging PET scan results, which showed metastasis in liver, left lung and diffuse nodal metastasis in chest and abdomen.  -She has started first-line therapy with Nivolumab, tolerating well. -She has successfully weaned off hydrocodone. She is on tylenol #3 twice daily, I encouraged her to continue weaning, she can use tramadol as needed. - I encouraged her to take Colace and do Miralax 4-5 days a week to help with constipation -PET scan from 08/01/17 reviewed.  Mixed response, her low cervical and thoracic lymph nodes have significantly decreased. However her liver metastasis have increased in size and metabolism. Her abdominal adenopathy has been stable. Overall she has a mixed response to immunotherapy.  -We discussed the liver metastasis change could be true disease progression, versus pseudo-progression secondary to immune response to therapy. -She has been doing well overall symptoms have much improved, but is she may still not be strong enough to do intensive chemotherapy. -I reviewed option of change treatment to chemotherapy, versus  continue Nivolumab and a follow-up scan in 8 weeks. -After a lengthy discussion on 08/06/2017 , we decided to continue nivolumab with close follow-up. If she shows signs and symptoms of cancer progression we will move to second line chemo sooner. She agrees. - I discussed that based on her stage, her cancer is not curative. Her treatment will be palliative and treatment will go on for as long as she can tolerate it.  -her last dose Nivolumab was 08/06/2017   3. Depression/anxiety and insomnia  -I suggest Mirtazapine, but she wishes to wait until after she is weaned off hydrocodone, she has been having a lot of anxiety during the weaning process of narcotics. -In the meantime I suggest sleep aid like melatonin over-the-counter  - I referred her to social worker  4. Left hip pain after hip replacement  -She underwent a total left hip replacement by Dr. Alvan Dame on 03/25/2017 -She has weaned off hydrocodone, on Tylenol No. 3 twice daily now -she will continue physical therapy as outpatient, she is overall improving. -I previously suggested the pt reduce Tylenol #3 by half tablets to  replace with tramadol, starting with the morning dose.   5. Goal of care discussion  -We again discussed the incurable nature of her cancer, and the overall poor prognosis, especially if she does not have good response to chemotherapy or progress  on chemo -The patient understands the goal of care is palliative. -I previously recommend DNR/DNI, she will think about it    PLAN:  -NS 1L today with Zofran 8mg  and dexa 4mg  IV - admit to hospital today, I spoke with Dr. Broadus John   No orders of the defined types were placed in this encounter.  All questions were answered. The patient knows to call the clinic with any problems, questions or concerns. No barriers to learning was detected.  I spent 25 minutes counseling the patient face to face. The total time spent in the appointment was 40 minutes and more than 50% was on counseling and review of test results  This document serves as a record of services personally performed by Truitt Merle, MD. It was created on her behalf by Brandt Loosen, a trained medical scribe. The creation of this record is based on the scribe's personal observations and the provider's statements to them. This document has been checked and approved by the attending provider.    Truitt Merle  08/14/2017

## 2017-08-14 NOTE — Telephone Encounter (Signed)
Dr Burr Medico talked with hospitalist & arranged for pt to be admitted to [facility] bed.  Notified Bed Placement & pt to go to 3022.   Pt taken in w/c to room 3022 @ 1734 with daughter. IV NS stopped & port capped.  Report given to [facility].

## 2017-08-14 NOTE — Progress Notes (Signed)
Pt in [facility] for intractable nausea/vomiting. Had been in ED yesterday for same. Now with fever of 100.8, increased BP.  Pt states she feels pretty bad.  Dr. Burr Medico in to see pt after prt accessed, labs drawn and IV fluids initiated.  Pt also received IV decadron and IV zofran.  Pt to be a direct admit to [facility] [place] with hospitalist.  Awaiting bed assignment. Pt and daughter aware and in agreement.  Bed available on [facility]. Report given to RN on [facility]. Pt transported via w/c after port flushed. Port access remains intact. Pt accompanied by daughter and son-in-law.

## 2017-08-14 NOTE — Telephone Encounter (Signed)
Pt's daughter came by & reports that mother has only drank @ 6 oz water since we talked earlier today.  Pt is not with her.  She states she is drooling & generally doesn't feel well.  Discussed with Dr Burr Medico & pt to come in today for lab, MD Visit & IVF. She states she can have her here @ 3:35 pm.

## 2017-08-15 ENCOUNTER — Telehealth: Payer: Self-pay | Admitting: Medical Oncology

## 2017-08-15 DIAGNOSIS — R112 Nausea with vomiting, unspecified: Secondary | ICD-10-CM

## 2017-08-15 LAB — BASIC METABOLIC PANEL
Anion gap: 6 (ref 5–15)
BUN: 8 mg/dL (ref 6–20)
CHLORIDE: 108 mmol/L (ref 101–111)
CO2: 27 mmol/L (ref 22–32)
CREATININE: 0.58 mg/dL (ref 0.44–1.00)
Calcium: 9 mg/dL (ref 8.9–10.3)
GFR calc Af Amer: >60 mL/min (ref >60)
GFR calc non Af Amer: >60 mL/min (ref >60)
Glucose, Bld: 102 mg/dL — ABNORMAL HIGH (ref 65–99)
POTASSIUM: 2.9 mmol/L — AB (ref 3.5–5.1)
SODIUM: 141 mmol/L (ref 135–145)

## 2017-08-15 LAB — URINALYSIS, ROUTINE W REFLEX MICROSCOPIC
Bacteria, UA: NONE SEEN
Bilirubin Urine: NEGATIVE
GLUCOSE, UA: NEGATIVE mg/dL
KETONES UR: 20 mg/dL — AB
Leukocytes, UA: NEGATIVE
Nitrite: NEGATIVE
PH: 8 (ref 5.0–8.0)
Protein, ur: 100 mg/dL — AB
SPECIFIC GRAVITY, URINE: 1.01 (ref 1.005–1.030)

## 2017-08-15 LAB — CBC
HEMATOCRIT: 41 % (ref 36.0–46.0)
HEMOGLOBIN: 14.3 g/dL (ref 12.0–15.0)
MCH: 26.6 pg (ref 26.0–34.0)
MCHC: 34.9 g/dL (ref 30.0–36.0)
MCV: 76.4 fL — AB (ref 78.0–100.0)
Platelets: 340 10*3/uL (ref 150–400)
RBC: 5.37 MIL/uL — AB (ref 3.87–5.11)
RDW: 15.9 % — ABNORMAL HIGH (ref 11.5–15.5)
WBC: 11.9 10*3/uL — ABNORMAL HIGH (ref 4.0–10.5)

## 2017-08-15 MED ORDER — DEXTROSE 5 % IV SOLN
500.0000 mg | Freq: Four times a day (QID) | INTRAVENOUS | Status: DC | PRN
  Administered 2017-08-15: 500 mg via INTRAVENOUS
  Filled 2017-08-15: qty 550

## 2017-08-15 MED ORDER — METOCLOPRAMIDE HCL 5 MG/ML IJ SOLN
5.0000 mg | Freq: Three times a day (TID) | INTRAMUSCULAR | Status: DC
  Administered 2017-08-15 – 2017-08-16 (×4): 5 mg via INTRAVENOUS
  Filled 2017-08-15 (×4): qty 2

## 2017-08-15 MED ORDER — POTASSIUM CHLORIDE 20 MEQ/15ML (10%) PO SOLN: 40.0000 meq | Freq: Two times a day (BID) | ORAL | Status: DC

## 2017-08-15 MED ORDER — POTASSIUM CHLORIDE 10 MEQ/100ML IV SOLN
10.0000 meq | INTRAVENOUS | Status: AC
  Administered 2017-08-15 (×4): 10 meq via INTRAVENOUS
  Filled 2017-08-15 (×8): qty 100

## 2017-08-15 NOTE — Progress Notes (Signed)
PROGRESS NOTE    [name]  QQI:[id number] DOB: 1956/11/30 DOA: 08/14/2017 PCP: Damaris Hippo, MD  Brief Narrative:[name] is a 61 y.o. female with medical history significant of metastatic rectal cancer with metastasis to lymph nodes, liver, lungs on immunotherapy with Nivolumab(Opdivo) presented to [facility] as a direct admission from [place]. Patient's daughter reported that she received her last immunotherapy dose   a week ago on 8/15 following which she had some malaise and constipation, constipation resolved after Miralax, did okay over the weekend and subsequently on Tuesday had increased nausea and constipation requiring miralax and mag citrate, after this started having multiple bowel movements and then on tuesday night had multiple episodes of vomiting after which her family brought her to the emergency room she had an unremarkable workup with labs and a CT scan which was negative for any acute findings and subsequently discharged home yesterday. She presented to [place] center was seen by Dr. Burr Medico due to increasing malaise weakness, vomiting and low-grade fever of 100.8 she did labs and requested direct admission to [facility] service.   Assessment & Plan:     Fever/Nausea/Vomiting -Etiology not clear at this time, could be of viral illness / tumor fever/ induced by immunotherapy -She had a CT Abd 8/22 which was negative for any acute abdominal findings -FU Blood Cx-NGTD -CXR and UA/lactate/lipase/LFTs all unremarkable -continue supportive care with IV fluids, clear liquid diet, antiemetics -Suspect that this may be related to her immunotherapy and or cancer progression     History of DVT -Continue xarelto    Nausea & vomiting -Acute on chronic issue  -Workup as noted above    Metastatic anal cancer  -On palliative immunotherapy  -Dr.Feng aware of admission  DVT prophylaxis: on xarelto Code Status: DNR  Family  Communication:  Daughter at bedside Disposition Plan: Home in improved   Consultants:   Dr.Feng    Subjective: Fever down, still very nauseous, and some vomiting  Objective: Vitals:   08/14/17 1750 08/14/17 2159 08/15/17 0000 08/15/17 0453  BP: (!) 187/80 (!) 185/88 (!) 172/79 (!) 179/84  Pulse: (!) 57 (!) 59 65 64  Resp: (!) 24 18  20   Temp: 100.3 F (37.9 C) 99.2 F (37.3 C)  98.3 F (36.8 C)  TempSrc: Oral Oral  Oral  SpO2: 98% 98%  98%  Weight: 63 kg (138 lb 14.4 oz)     Height: 5\' 3"  (1.6 m)       Intake/Output Summary (Last 24 hours) at 08/15/17 1359 Last data filed at 08/15/17 0600  Gross per 24 hour  Intake             1225 ml  Output              300 ml  Net              925 ml   Filed Weights   08/14/17 1750  Weight: 63 kg (138 lb 14.4 oz)    Examination:  General exam: Chronically ill-appearing female Respiratory system: Clear bilaterally Cardiovascular system: S1 & S2 heard, RRR Gastrointestinal system: Abdomen is nondistended, soft and nontender. Normal bowel sounds heard. Central nervous system: Alert and oriented. No focal neurological deficits. Extremities: Symmetric 5 x 5 power. Skin: No rashes, lesions or ulcers Psychiatry: Depressed, flat affect    Data Reviewed:   CBC:  Recent Labs Lab 08/13/17 0354 08/14/17 1601 08/14/17 1950 08/15/17 0500  WBC 10.6* 12.8* 12.6* 11.9*  NEUTROABS  --  9.8*  --   --   HGB 14.0 14.8 14.5 14.3  HCT 40.1 45.2 41.0 41.0  MCV 77.1* 79.8 76.6* 76.4*  PLT 325 372 315 030   Basic Metabolic Panel:  Recent Labs Lab 08/13/17 0354 08/14/17 1601 08/14/17 1950 08/15/17 0500  NA 140 139 138 141  K 3.5 3.1* 3.7 2.9*  CL 104  --  104 108  CO2 26 28 25 27   GLUCOSE 180* 108 114* 102*  BUN 5* 8.2 8 8   CREATININE 0.62 0.8 0.60 0.58  CALCIUM 9.3 9.7 8.8* 9.0   GFR: Estimated Creatinine Clearance: 66.8 mL/min (by C-G formula based on SCr of 0.58 mg/dL). Liver Function Tests:  Recent Labs Lab  08/13/17 0354 08/14/17 1601 08/14/17 1950  AST 17 21 23   ALT 10* 14 16  ALKPHOS 98 115 91  BILITOT 0.6 0.91 1.1  PROT 7.5 8.2 7.6  ALBUMIN 4.1 4.1 4.2    Recent Labs Lab 08/13/17 0353 08/14/17 1950  LIPASE 20 24   No results for input(s): AMMONIA in the last 168 hours. Coagulation Profile: No results for input(s): INR, PROTIME in the last 168 hours. Cardiac Enzymes: No results for input(s): CKTOTAL, CKMB, CKMBINDEX, TROPONINI in the last 168 hours. BNP (last 3 results) No results for input(s): PROBNP in the last 8760 hours. HbA1C: No results for input(s): HGBA1C in the last 72 hours. CBG: No results for input(s): GLUCAP in the last 168 hours. Lipid Profile: No results for input(s): CHOL, HDL, LDLCALC, TRIG, CHOLHDL, LDLDIRECT in the last 72 hours. Thyroid Function Tests: No results for input(s): TSH, T4TOTAL, FREET4, T3FREE, THYROIDAB in the last 72 hours. Anemia Panel: No results for input(s): VITAMINB12, FOLATE, FERRITIN, TIBC, IRON, RETICCTPCT in the last 72 hours. Urine analysis:    Component Value Date/Time   COLORURINE YELLOW 08/15/2017 0146   APPEARANCEUR CLEAR 08/15/2017 0146   LABSPEC 1.010 08/15/2017 0146   PHURINE 8.0 08/15/2017 0146   GLUCOSEU NEGATIVE 08/15/2017 0146   HGBUR SMALL (A) 08/15/2017 0146   BILIRUBINUR NEGATIVE 08/15/2017 0146   KETONESUR 20 (A) 08/15/2017 0146   PROTEINUR 100 (A) 08/15/2017 0146   NITRITE NEGATIVE 08/15/2017 0146   LEUKOCYTESUR NEGATIVE 08/15/2017 0146   Sepsis Labs: @LABRCNTIP (procalcitonin:4,lacticidven:4)  )No results found for this or any previous visit (from the past 240 hour(s)).       Radiology Studies: Dg Chest 2 View  Result Date: 08/14/2017 CLINICAL DATA:  Metastatic rectal cancer.  Low-grade fever. EXAM: CHEST  2 VIEW COMPARISON:  08/13/2017 FINDINGS: The lungs are clear without focal pneumonia, edema, pneumothorax or pleural effusion. The cardiopericardial silhouette is within normal limits for size.  The visualized bony structures of the thorax are intact. Left Port-A-Cath tip overlies the mid SVC. IMPRESSION: Stable.  No acute findings. Electronically Signed   By: Misty Stanley M.D.   On: 08/14/2017 20:22        Scheduled Meds: . dronabinol  5 mg Oral BID AC  . metoCLOPramide (REGLAN) injection  5 mg Intravenous TID AC  . pantoprazole  40 mg Oral Daily  . rivaroxaban  20 mg Oral Q supper   Continuous Infusions: . sodium chloride 75 mL/hr at 08/15/17 0957  . potassium chloride 10 mEq (08/15/17 1357)     LOS: 1 day    Time spent: 47min    Domenic Polite, MD [facility] Pager [phone]  If 7PM-7AM, please contact night-coverage [email or web address] Password [facility] 08/15/2017, 1:59 PM

## 2017-08-15 NOTE — Progress Notes (Signed)
Initial Nutrition Assessment  DOCUMENTATION CODES:   Not applicable  INTERVENTION:  - Continue to encourage intakes of CLD. - Diet advancement as medically feasible. - RD will discuss oral nutrition supplements and foods that pt may do better consuming.  NUTRITION DIAGNOSIS:   Increased nutrient needs related to catabolic illness, cancer and cancer related treatments as evidenced by estimated needs.  GOAL:   Patient will meet greater than or equal to 90% of their needs  MONITOR:   PO intake, Diet advancement, Weight trends, Labs  REASON FOR ASSESSMENT:   Malnutrition Screening Tool  ASSESSMENT:   61 y.o. female with medical history significant of metastatic rectal cancer with metastasis to lymph nodes, liver, lungs on immunotherapy with Nivolumab(Opdivo). Direct admit from [facility] after meeting with Oncologist d/t increasing malaise, weakness, vomiting, and low-grade fever  Pt seen for MST. BMI indicates normal weight status. Pt has been on CLD since admission and states that she has only tried to sip water; sometimes it stays down and sometimes it has come back up. She asks what she should consume when "I break my fast." Reviewed notes from [facility] RD (last: 08/06/17) in which she had encouraged pt to consume warm beverages (to aid with BMs). Discussed this again with pt as it relates to current diet order. Pt reports she is a vegetarian. Pt is interested in further assistance in knowing what to consume but states that she is feeling extremely nauseated at this time despite being given antiemetic recently. Pt had eyes closed most of the visit and would mainly mouth words or slowly shake/nod head. Will communicate with RD working over the weekend and have her see pt if time permits.   Physical assessment unable to be done at this time per pt request; will attempt at follow-up. Per chart review, pt has lost 5 lbs (3.5% body weight) in the past 8 days which is significant for time frame.  Unsure if any of this is a component of fluid loss from vomiting; monitor weight trends closely.   Medications reviewed; 5 mg Marinol BID, 5 mg IV Reglan TID, 40 mg oral Protonix/day.  Labs reviewed; K: 2.9 mmol/L.  IVF: NS @ 75 mL/hr.      Diet Order:  Diet clear liquid Room service appropriate? Yes; Fluid consistency: Thin  Skin:  Reviewed, no issues  Last BM:  8/21  Height:   Ht Readings from Last 1 Encounters:  08/14/17 5\' 3"  (1.6 m)    Weight:   Wt Readings from Last 1 Encounters:  08/14/17 138 lb 14.4 oz (63 kg)    Ideal Body Weight:  52.27 kg  BMI:  Body mass index is 24.61 kg/m.  Estimated Nutritional Needs:   Kcal:  2015-2205 (32-35 kcal/kg)  Protein:  115-125 grams  Fluid:  >/= 2 L/day  EDUCATION NEEDS:   No education needs identified at this time    Jarome Matin, MS, RD, LDN, CNSC Inpatient Clinical Dietitian Pager # [phone] After hours/weekend pager # [phone]

## 2017-08-15 NOTE — Telephone Encounter (Signed)
[name]  would like to talk to Dr Burr Medico re plan of care for pt.

## 2017-08-16 LAB — COMPREHENSIVE METABOLIC PANEL
ALT: 14 U/L (ref 14–54)
AST: 15 U/L (ref 15–41)
Albumin: 3.6 g/dL (ref 3.5–5.0)
Alkaline Phosphatase: 80 U/L (ref 38–126)
Anion gap: 10 (ref 5–15)
BUN: 8 mg/dL (ref 6–20)
CHLORIDE: 105 mmol/L (ref 101–111)
CO2: 23 mmol/L (ref 22–32)
CREATININE: 0.61 mg/dL (ref 0.44–1.00)
Calcium: 8.5 mg/dL — ABNORMAL LOW (ref 8.9–10.3)
Glucose, Bld: 87 mg/dL (ref 65–99)
Potassium: 3.3 mmol/L — ABNORMAL LOW (ref 3.5–5.1)
Sodium: 138 mmol/L (ref 135–145)
Total Bilirubin: 1.3 mg/dL — ABNORMAL HIGH (ref 0.3–1.2)
Total Protein: 7 g/dL (ref 6.5–8.1)

## 2017-08-16 LAB — URINE CULTURE: Culture: <10000 — AB

## 2017-08-16 LAB — CBC
HCT: 41.4 % (ref 36.0–46.0)
Hemoglobin: 14 g/dL (ref 12.0–15.0)
MCH: 26.1 pg (ref 26.0–34.0)
MCHC: 33.8 g/dL (ref 30.0–36.0)
MCV: 77.1 fL — AB (ref 78.0–100.0)
PLATELETS: 331 10*3/uL (ref 150–400)
RBC: 5.37 MIL/uL — AB (ref 3.87–5.11)
RDW: 15.8 % — AB (ref 11.5–15.5)
WBC: 10.6 10*3/uL — ABNORMAL HIGH (ref 4.0–10.5)

## 2017-08-16 MED ORDER — METOCLOPRAMIDE HCL 5 MG/ML IJ SOLN
10.0000 mg | Freq: Four times a day (QID) | INTRAMUSCULAR | Status: DC
  Administered 2017-08-16 – 2017-08-19 (×10): 10 mg via INTRAVENOUS
  Filled 2017-08-16 (×10): qty 2

## 2017-08-16 MED ORDER — SODIUM CHLORIDE 0.9 % IV SOLN
8.0000 mg | Freq: Three times a day (TID) | INTRAVENOUS | Status: DC
  Administered 2017-08-16 – 2017-08-19 (×8): 8 mg via INTRAVENOUS
  Filled 2017-08-16 (×12): qty 4

## 2017-08-16 MED ORDER — SCOPOLAMINE HBR 0.4 MG/ML IJ SOLN: 0.4000 mg | Freq: Once | INTRAMUSCULAR | Status: DC

## 2017-08-16 MED ORDER — POLYETHYLENE GLYCOL 3350 17 G PO PACK
17.0000 g | PACK | Freq: Two times a day (BID) | ORAL | Status: DC
  Administered 2017-08-16 – 2017-08-19 (×5): 17 g via ORAL
  Filled 2017-08-16 (×5): qty 1

## 2017-08-16 MED ORDER — METHOCARBAMOL 1000 MG/10ML IJ SOLN
500.0000 mg | Freq: Four times a day (QID) | INTRAVENOUS | Status: DC | PRN
  Administered 2017-08-16 – 2017-08-17 (×2): 500 mg via INTRAVENOUS
  Filled 2017-08-16 (×2): qty 550

## 2017-08-16 MED ORDER — METOCLOPRAMIDE HCL 5 MG/ML IJ SOLN
10.0000 mg | Freq: Once | INTRAMUSCULAR | Status: AC
  Administered 2017-08-16: 10 mg via INTRAVENOUS
  Filled 2017-08-16: qty 2

## 2017-08-16 MED ORDER — SCOPOLAMINE 1 MG/3DAYS TD PT72
1.0000 | MEDICATED_PATCH | TRANSDERMAL | Status: DC
Start: 2017-08-16 — End: 2017-08-19
  Administered 2017-08-16 – 2017-08-19 (×2): 1.5 mg via TRANSDERMAL
  Filled 2017-08-16 (×2): qty 1

## 2017-08-16 MED ORDER — PANTOPRAZOLE SODIUM 40 MG IV SOLR
40.0000 mg | Freq: Two times a day (BID) | INTRAVENOUS | Status: DC
  Administered 2017-08-16 – 2017-08-19 (×7): 40 mg via INTRAVENOUS
  Filled 2017-08-16 (×7): qty 40

## 2017-08-16 NOTE — Progress Notes (Signed)
Pt was lying in bed w/eyes closed but responded when called. She appeared tired and would like a visit at another time to talk and for prayer. Please page when further assistance is needed if prior to next rounding. [place], [place] [phone]   08/16/17 1800  Clinical Encounter Type  Visited With Patient

## 2017-08-16 NOTE — Telephone Encounter (Signed)
I called [name] this morning and spoke with her. She has been afebrile since admission, ID workup negative. It could be virus infection, and it may take longer time for her to recover due to her underlying malignancy and comorbidities. Certainly persistent nausea and vomiting, anorexia could be also related to her underlying metastatic cancer. Her recent PET scan showed possible disease progression in her liver. We previously discussed changing treatment to chemotherapy, however she still not a good candidate for chemotherapy. Patient also does not want to try chemo (she previously tolerated poorly).  I will postpone her next office f/u and nivolumab which is scheduled for  for 8/29. I will see her back in hospital on Monday if she is still in.   Truitt Merle MD

## 2017-08-16 NOTE — Progress Notes (Addendum)
PROGRESS NOTE    [name]  GYJ:[id number] DOB: [birth year] DOA: 08/14/2017 PCP: Damaris Hippo, MD  Brief Narrative:[name] is a 61 y.o. female with medical history significant of metastatic rectal cancer with metastasis to lymph nodes, liver, lungs on immunotherapy with Nivolumab(Opdivo) presented to [facility] hospital as a direct admission from [place]. Patient's daughter reported that she received her last immunotherapy dose   a week ago on 8/15 following which she had some malaise and constipation, constipation resolved after Miralax, did okay over the weekend and subsequently on Tuesday had increased nausea and constipation requiring miralax and mag citrate, after this started having multiple bowel movements and then on tuesday night had multiple episodes of vomiting after which her family brought her to the emergency room she had an unremarkable workup with labs and a CT scan which was negative for any acute findings and subsequently discharged home yesterday. She presented to [place] center was seen by Dr. Burr Medico due to increasing malaise weakness, vomiting and low-grade fever of 100.8 she did labs and requested direct admission to [facility] service.   Assessment & Plan:   Persistent Nausea/Vomiting -Etiology not clear, on admission had low grade fever, which has since resolved and infectious workup negative  -At this time symptoms appear somewhat to be subacute, likely related to her metastatic cancer -She had a CT Abd 8/22 which was negative for any acute abdominal findings -FU Blood Cx-NGTD -CXR and UA/lactate/lipase/LFTs/bili all unremarkable -no improvement despite 3 days of supportive care, will ask GI for input, esp since she is 10days post immunotherapy without any improvement in symptoms -continue IV PPI     History of DVT -Continue xarelto    Nausea & vomiting -Acute on chronic issue  -Workup as noted above    Metastatic anal  cancer  -On palliative immunotherapy  -Dr.Feng aware of admission  DVT prophylaxis: on xarelto Code Status: DNR  Family Communication:  Daughter at bedside Disposition Plan: Home in improved   Consultants:   Dr.Feng    Subjective: No fevers, persistent severe nausea, vomited yesterday too  Objective: Vitals:   08/15/17 0453 08/15/17 1516 08/15/17 2205 08/16/17 0631  BP: (!) 179/84 (!) 165/82 135/77 (!) 189/94  Pulse: 64 77 70 76  Resp: 20 20 16 20   Temp: 98.3 F (36.8 C) 98.8 F (37.1 C) 98.7 F (37.1 C) 99 F (37.2 C)  TempSrc: Oral Oral Oral Oral  SpO2: 98% 99% 98% 98%  Weight:      Height:        Intake/Output Summary (Last 24 hours) at 08/16/17 2637 Last data filed at 08/16/17 8588  Gross per 24 hour  Intake          1673.75 ml  Output             1200 ml  Net           473.75 ml   Filed Weights   08/14/17 1750  Weight: 63 kg (138 lb 14.4 oz)    Examination:  General exam: Chronically ill-appearing female, depressed Respiratory system: Clear bilaterally Cardiovascular system: S1 & S2 heard, RRR Gastrointestinal system: Abdomen is nondistended, soft and nontender. Normal bowel sounds heard. Central nervous system: Alert and oriented. No focal neurological deficits. Extremities: Symmetric 5 x 5 power. Skin: No rashes, lesions or ulcers Psychiatry: Depressed, flat affect    Data Reviewed:   CBC:  Recent Labs Lab 08/13/17 0354 08/14/17 1601 08/14/17 1950 08/15/17 0500 08/16/17 0500  WBC 10.6* 12.8* 12.6* 11.9* 10.6*  NEUTROABS  --  9.8*  --   --   --   HGB 14.0 14.8 14.5 14.3 14.0  HCT 40.1 45.2 41.0 41.0 41.4  MCV 77.1* 79.8 76.6* 76.4* 77.1*  PLT 325 372 315 340 846   Basic Metabolic Panel:  Recent Labs Lab 08/13/17 0354 08/14/17 1601 08/14/17 1950 08/15/17 0500 08/16/17 0500  NA 140 139 138 141 138  K 3.5 3.1* 3.7 2.9* 3.3*  CL 104  --  104 108 105  CO2 26 28 25 27 23   GLUCOSE 180* 108 114* 102* 87  BUN 5* 8.2 8 8 8     CREATININE 0.62 0.8 0.60 0.58 0.61  CALCIUM 9.3 9.7 8.8* 9.0 8.5*   GFR: Estimated Creatinine Clearance: 66.8 mL/min (by C-G formula based on SCr of 0.61 mg/dL). Liver Function Tests:  Recent Labs Lab 08/13/17 0354 08/14/17 1601 08/14/17 1950 08/16/17 0500  AST 17 21 23 15   ALT 10* 14 16 14   ALKPHOS 98 115 91 80  BILITOT 0.6 0.91 1.1 1.3*  PROT 7.5 8.2 7.6 7.0  ALBUMIN 4.1 4.1 4.2 3.6    Recent Labs Lab 08/13/17 0353 08/14/17 1950  LIPASE 20 24   No results for input(s): AMMONIA in the last 168 hours. Coagulation Profile: No results for input(s): INR, PROTIME in the last 168 hours. Cardiac Enzymes: No results for input(s): CKTOTAL, CKMB, CKMBINDEX, TROPONINI in the last 168 hours. BNP (last 3 results) No results for input(s): PROBNP in the last 8760 hours. HbA1C: No results for input(s): HGBA1C in the last 72 hours. CBG: No results for input(s): GLUCAP in the last 168 hours. Lipid Profile: No results for input(s): CHOL, HDL, LDLCALC, TRIG, CHOLHDL, LDLDIRECT in the last 72 hours. Thyroid Function Tests: No results for input(s): TSH, T4TOTAL, FREET4, T3FREE, THYROIDAB in the last 72 hours. Anemia Panel: No results for input(s): VITAMINB12, FOLATE, FERRITIN, TIBC, IRON, RETICCTPCT in the last 72 hours. Urine analysis:    Component Value Date/Time   COLORURINE YELLOW 08/15/2017 0146   APPEARANCEUR CLEAR 08/15/2017 0146   LABSPEC 1.010 08/15/2017 0146   PHURINE 8.0 08/15/2017 0146   GLUCOSEU NEGATIVE 08/15/2017 0146   HGBUR SMALL (A) 08/15/2017 0146   BILIRUBINUR NEGATIVE 08/15/2017 0146   KETONESUR 20 (A) 08/15/2017 0146   PROTEINUR 100 (A) 08/15/2017 0146   NITRITE NEGATIVE 08/15/2017 0146   LEUKOCYTESUR NEGATIVE 08/15/2017 0146   Sepsis Labs: @LABRCNTIP (procalcitonin:4,lacticidven:4)  )No results found for this or any previous visit (from the past 240 hour(s)).       Radiology Studies: Dg Chest 2 View  Result Date: 08/14/2017 CLINICAL DATA:   Metastatic rectal cancer.  Low-grade fever. EXAM: CHEST  2 VIEW COMPARISON:  08/13/2017 FINDINGS: The lungs are clear without focal pneumonia, edema, pneumothorax or pleural effusion. The cardiopericardial silhouette is within normal limits for size. The visualized bony structures of the thorax are intact. Left Port-A-Cath tip overlies the mid SVC. IMPRESSION: Stable.  No acute findings. Electronically Signed   By: Misty Stanley M.D.   On: 08/14/2017 20:22        Scheduled Meds: . dronabinol  5 mg Oral BID AC  . metoCLOPramide (REGLAN) injection  5 mg Intravenous TID AC  . pantoprazole (PROTONIX) IV  40 mg Intravenous Q12H  . rivaroxaban  20 mg Oral Q supper  . scopolamine  1 patch Transdermal Q72H   Continuous Infusions: . sodium chloride 75 mL/hr at 08/15/17 2157     LOS: 2 days  Time spent: 44min    Domenic Polite, MD [facility] Pager [phone]  If 7PM-7AM, please contact night-coverage [email or web address] Password [email or web address] 08/16/2017, 8:07 AM

## 2017-08-16 NOTE — Progress Notes (Signed)
Ms. [name] is experiencing persistent nausea after trying zofran and phenergen. Paged the night MD.

## 2017-08-16 NOTE — Consult Note (Signed)
[facility] CONSULT Reason for consult: nausea and vomiting Referring Physician: [facility]. PCP: Dr. Gust Brooms [name] is an 61 y.o. female.  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 She's been treated in the past with chemo and radiation therapy concurrently taking Nivolumab, immunotherapy, every 2 weeks. Flexible sigmoidoscopy by Dr. Cristina Gong 4/18 to not show cancer although they were thicken lesions in the rectum. She had perirectal adenopathy biopsy showed poorly differentiated squamous cell cancer. The patient notes that she has been nauseatingly than constipated is gone 5 days without a bowel movement. It home she has been taking lots of codeine and has been taking lactulose. According to the records from which her home medications are obtained, she was on Marinol, Zofran, Miralax, Imodium, lactulose, and Xarelto. She states that she's always been a little nauseated. For the past 3 or 4 days she's nausea to the point that just thinking about food makes her vomit. She had recent hip surgery and ever since that time she has been severely anorexic constipated nausea. CT scan of the abdomen and pelvis on the submission showed metastatic disease in the liver, abdominal lymph nodes, perirectal area. It did not show any clear gallstones or abnormal biliary findings. Pancreas was normal. The large rectal mass was unchanged and there were no signs of bowel obstruction and what appeared to me to be a fair amount stool. There was no clear infectious process. She currently is on Marinol orally metoclopramide Protonix IV and scopolamine. She is receiving Zofran PRN Miralax 1 dose daily.  Past Medical History:  Diagnosis Date  . Anxiety   . Arthritis   . Asthma    childhood   . Cancer [facility])    colorectal cancer with chemo and radiation   . DVT (deep venous thrombosis) (HCC)    left lower extremity  . Dyspnea    with anxiety and exertion   . GERD (gastroesophageal reflux disease)   . Neuromuscular disorder (HCC)    poly neuropathy   . Stroke [facility]) 2010, 2011   TIA  x2    Past Surgical History:  Procedure Laterality Date  . CESAREAN SECTION    . FLEXIBLE SIGMOIDOSCOPY N/A 04/17/2017   Procedure: FLEXIBLE SIGMOIDOSCOPY;  Surgeon: Ronald Lobo, MD;  Location: [facility] ENDOSCOPY;  Service: Endoscopy;  Laterality: N/A;  . IR FLUORO GUIDE PORT INSERTION LEFT  06/04/2017  . IR REMOVAL TUN ACCESS W/ PORT W/O FL MOD SED  06/04/2017  . IR US GUIDE VASC ACCESS LEFT  06/04/2017  . IR VENO/EXT/UNI LEFT  06/04/2017  . THYROID CYST EXCISION    . TOTAL HIP ARTHROPLASTY Left 03/25/2017   Procedure: LEFT TOTAL HIP ARTHROPLASTY ANTERIOR APPROACH;  Surgeon: Paralee Cancel, MD;  Location: [facility] ORS;  Service: Orthopedics;  Laterality: Left;  Requests 70 mins    Family History  Problem Relation Age of Onset  . Hypertension Mother   . Dementia Mother     Social History:  reports that she has quit smoking. Her smoking use included Cigarettes. She has never used smokeless tobacco. She reports that she does not drink alcohol or use drugs.  Allergies:  Allergies  Allergen Reactions  . Ativan [Lorazepam] Other (See Comments)    Loss of memory and change in behavior.  . Penicillins Itching    Has patient had a PCN reaction causing immediate rash, facial/tongue/throat swelling, SOB or lightheadedness with hypotension: Unknown Has patient had a PCN reaction causing severe rash involving  mucus membranes or skin necrosis: Unknown Has patient had a PCN reaction that required hospitalization Unknown Has patient had a PCN reaction occurring within the last 10 years: Unknown Daughter states pt cant take any derivative of penicillin either    Medications; Prior to Admission medications   Medication Sig Start Date End Date Taking? Authorizing Provider  acetaminophen-codeine (TYLENOL #3) 300-30 MG tablet Take 1 tablet by mouth every 12 (twelve) hours as needed  for moderate pain. Patient taking differently: Take 0.5 tablets by mouth daily.  07/15/17  Yes Truitt Merle, MD  docusate sodium (COLACE) 100 MG capsule Take 1 capsule (100 mg total) by mouth 2 (two) times daily. Patient taking differently: Take 100 mg by mouth daily.  03/25/17  Yes Babish, Rodman Key, PA-C  dronabinol (MARINOL) 5 MG capsule Take 1 capsule (5 mg total) by mouth 2 (two) times daily before a meal. 07/10/17  Yes Truitt Merle, MD  LACTULOSE PO Take 1 Package by mouth daily as needed (constipation).    Yes [provider]  lidocaine-prilocaine (EMLA) cream Apply 1 application topically as needed. Patient taking differently: Apply 1 application topically as needed (for port).  05/16/17  Yes Truitt Merle, MD  loperamide (IMODIUM A-D) 2 MG tablet Take 1 tablet (2 mg total) by mouth 4 (four) times daily as needed for diarrhea or loose stools. 08/13/17  Yes Fredia Sorrow, MD  magnesium citrate SOLN Take 1 Bottle by mouth once.   Yes [provider]  methocarbamol (ROBAXIN) 500 MG tablet Take 1 tablet (500 mg total) by mouth every 6 (six) hours as needed for muscle spasms. 03/25/17  Yes Babish, Rodman Key, PA-C  metoCLOPramide (REGLAN) 10 MG tablet Take 1 tablet (10 mg total) by mouth 4 (four) times daily -  before meals and at bedtime. Patient taking differently: Take 10 mg by mouth 2 (two) times daily.  06/27/17  Yes Truitt Merle, MD  Multiple Vitamins-Minerals (CENTRUM ADULTS) TABS Take 1 tablet by mouth daily.   Yes [provider]  omeprazole (PRILOSEC OTC) 20 MG tablet Take 20 mg by mouth daily as needed (for acid reflex).   Yes [provider]  ondansetron (ZOFRAN ODT) 8 MG disintegrating tablet Take 1 tablet (8 mg total) by mouth every 8 (eight) hours as needed for nausea or vomiting. 07/23/17  Yes Truitt Merle, MD  polyethylene glycol [facility] / GLYCOLAX) packet Take 17 g by mouth 2 (two) times daily. Patient taking differently: Take 17 g by mouth daily as needed for mild  constipation.  03/25/17  Yes Babish, Rodman Key, PA-C  promethazine (PHENERGAN) 25 MG tablet Take 1 tablet (25 mg total) by mouth every 6 (six) hours as needed for nausea or vomiting. 08/13/17  Yes Fredia Sorrow, MD  rivaroxaban (XARELTO) 20 MG TABS tablet Take 1 tablet (20 mg total) by mouth daily with supper. Please follow up with PCP to continue this medication. 03/27/17  Yes Babish, Rodman Key, PA-C  saccharomyces boulardii (FLORASTOR) 250 MG capsule Take 250 mg by mouth daily.   Yes [provider]  traMADol (ULTRAM) 50 MG tablet Take 1 tablet (50 mg total) by mouth every 6 (six) hours as needed. Patient taking differently: Take 50 mg by mouth 2 (two) times daily. Takes at 0700 and 1300 07/10/17  Yes Truitt Merle, MD  ondansetron (ZOFRAN ODT) 4 MG disintegrating tablet Take 1 tablet (4 mg total) by mouth every 8 (eight) hours as needed for nausea or vomiting. Patient not taking: Reported on 08/14/2017 08/13/17   Fredia Sorrow,  MD   . dronabinol  5 mg Oral BID AC  . metoCLOPramide (REGLAN) injection  5 mg Intravenous TID AC  . pantoprazole (PROTONIX) IV  40 mg Intravenous Q12H  . rivaroxaban  20 mg Oral Q supper  . scopolamine  1 patch Transdermal Q72H   PRN Meds acetaminophen **OR** acetaminophen, acetaminophen-codeine, hydrALAZINE, lidocaine-prilocaine, ondansetron **OR** ondansetron (ZOFRAN) IV, polyethylene glycol, promethazine Results for orders placed or performed during the hospital encounter of 08/14/17 (from the past 48 hour(s))  Culture, blood (routine x 2)     Status: None (Preliminary result)   Collection Time: 08/14/17  7:45 PM  Result Value Ref Range   Specimen Description BLOOD LEFT HAND    Special Requests IN PEDIATRIC BOTTLE Blood Culture adequate volume    Culture NO GROWTH 1 DAY    Report Status PENDING   Lipase, blood     Status: None   Collection Time: 08/14/17  7:50 PM  Result Value Ref Range   Lipase 24 11 - 51 U/L  Lactic acid, plasma     Status: None    Collection Time: 08/14/17  7:50 PM  Result Value Ref Range   Lactic Acid, Venous 1.1 0.5 - 1.9 mmol/L  CBC     Status: Abnormal   Collection Time: 08/14/17  7:50 PM  Result Value Ref Range   WBC 12.6 (H) 4.0 - 10.5 K/uL   RBC 5.35 (H) 3.87 - 5.11 MIL/uL   Hemoglobin 14.5 12.0 - 15.0 g/dL   HCT 41.0 36.0 - 46.0 %   MCV 76.6 (L) 78.0 - 100.0 fL   MCH 27.1 26.0 - 34.0 pg   MCHC 35.4 30.0 - 36.0 g/dL   RDW 15.9 (H) 11.5 - 15.5 %   Platelets 315 150 - 400 K/uL  Comprehensive metabolic panel     Status: Abnormal   Collection Time: 08/14/17  7:50 PM  Result Value Ref Range   Sodium 138 135 - 145 mmol/L   Potassium 3.7 3.5 - 5.1 mmol/L   Chloride 104 101 - 111 mmol/L   CO2 25 22 - 32 mmol/L   Glucose, Bld 114 (H) 65 - 99 mg/dL   BUN 8 6 - 20 mg/dL   Creatinine, Ser 0.60 0.44 - 1.00 mg/dL   Calcium 8.8 (L) 8.9 - 10.3 mg/dL   Total Protein 7.6 6.5 - 8.1 g/dL   Albumin 4.2 3.5 - 5.0 g/dL   AST 23 15 - 41 U/L   ALT 16 14 - 54 U/L   Alkaline Phosphatase 91 38 - 126 U/L   Total Bilirubin 1.1 0.3 - 1.2 mg/dL   GFR calc non Af Amer >60 >60 mL/min   GFR calc Af Amer >60 >60 mL/min    Comment: (NOTE) The eGFR has been calculated using the CKD EPI equation. This calculation has not been validated in all clinical situations. eGFR's persistently <60 mL/min signify possible Chronic Kidney Disease.    Anion gap 9 5 - 15  Culture, blood (routine x 2)     Status: None (Preliminary result)   Collection Time: 08/14/17  8:03 PM  Result Value Ref Range   Specimen Description BLOOD LEFT HAND    Special Requests IN PEDIATRIC BOTTLE Blood Culture adequate volume    Culture NO GROWTH 1 DAY    Report Status PENDING   Lactic acid, plasma     Status: None   Collection Time: 08/14/17 11:22 PM  Result Value Ref Range   Lactic Acid, Venous 1.0 0.5 -  1.9 mmol/L  Urinalysis, Routine w reflex microscopic     Status: Abnormal   Collection Time: 08/15/17  1:46 AM  Result Value Ref Range   Color, Urine  YELLOW YELLOW   APPearance CLEAR CLEAR   Specific Gravity, Urine 1.010 1.005 - 1.030   pH 8.0 5.0 - 8.0   Glucose, UA NEGATIVE NEGATIVE mg/dL   Hgb urine dipstick SMALL (A) NEGATIVE   Bilirubin Urine NEGATIVE NEGATIVE   Ketones, ur 20 (A) NEGATIVE mg/dL   Protein, ur 100 (A) NEGATIVE mg/dL   Nitrite NEGATIVE NEGATIVE   Leukocytes, UA NEGATIVE NEGATIVE   RBC / HPF 0-5 0 - 5 RBC/hpf   WBC, UA 0-5 0 - 5 WBC/hpf   Bacteria, UA NONE SEEN NONE SEEN   Squamous Epithelial / LPF 0-5 (A) NONE SEEN   Mucus PRESENT   CBC     Status: Abnormal   Collection Time: 08/15/17  5:00 AM  Result Value Ref Range   WBC 11.9 (H) 4.0 - 10.5 K/uL   RBC 5.37 (H) 3.87 - 5.11 MIL/uL   Hemoglobin 14.3 12.0 - 15.0 g/dL   HCT 41.0 36.0 - 46.0 %   MCV 76.4 (L) 78.0 - 100.0 fL   MCH 26.6 26.0 - 34.0 pg   MCHC 34.9 30.0 - 36.0 g/dL   RDW 15.9 (H) 11.5 - 15.5 %   Platelets 340 150 - 400 K/uL  Basic metabolic panel     Status: Abnormal   Collection Time: 08/15/17  5:00 AM  Result Value Ref Range   Sodium 141 135 - 145 mmol/L   Potassium 2.9 (L) 3.5 - 5.1 mmol/L    Comment: DELTA CHECK NOTED   Chloride 108 101 - 111 mmol/L   CO2 27 22 - 32 mmol/L   Glucose, Bld 102 (H) 65 - 99 mg/dL   BUN 8 6 - 20 mg/dL   Creatinine, Ser 0.58 0.44 - 1.00 mg/dL   Calcium 9.0 8.9 - 10.3 mg/dL   GFR calc non Af Amer >60 >60 mL/min   GFR calc Af Amer >60 >60 mL/min    Comment: (NOTE) The eGFR has been calculated using the CKD EPI equation. This calculation has not been validated in all clinical situations. eGFR's persistently <60 mL/min signify possible Chronic Kidney Disease.    Anion gap 6 5 - 15  CBC     Status: Abnormal   Collection Time: 08/16/17  5:00 AM  Result Value Ref Range   WBC 10.6 (H) 4.0 - 10.5 K/uL   RBC 5.37 (H) 3.87 - 5.11 MIL/uL   Hemoglobin 14.0 12.0 - 15.0 g/dL   HCT 41.4 36.0 - 46.0 %   MCV 77.1 (L) 78.0 - 100.0 fL   MCH 26.1 26.0 - 34.0 pg   MCHC 33.8 30.0 - 36.0 g/dL   RDW 15.8 (H) 11.5 - 15.5  %   Platelets 331 150 - 400 K/uL  Comprehensive metabolic panel     Status: Abnormal   Collection Time: 08/16/17  5:00 AM  Result Value Ref Range   Sodium 138 135 - 145 mmol/L   Potassium 3.3 (L) 3.5 - 5.1 mmol/L   Chloride 105 101 - 111 mmol/L   CO2 23 22 - 32 mmol/L   Glucose, Bld 87 65 - 99 mg/dL   BUN 8 6 - 20 mg/dL   Creatinine, Ser 0.61 0.44 - 1.00 mg/dL   Calcium 8.5 (L) 8.9 - 10.3 mg/dL   Total Protein 7.0 6.5 - 8.1 g/dL  Albumin 3.6 3.5 - 5.0 g/dL   AST 15 15 - 41 U/L   ALT 14 14 - 54 U/L   Alkaline Phosphatase 80 38 - 126 U/L   Total Bilirubin 1.3 (H) 0.3 - 1.2 mg/dL   GFR calc non Af Amer >60 >60 mL/min   GFR calc Af Amer >60 >60 mL/min    Comment: (NOTE) The eGFR has been calculated using the CKD EPI equation. This calculation has not been validated in all clinical situations. eGFR's persistently <60 mL/min signify possible Chronic Kidney Disease.    Anion gap 10 5 - 15    Dg Chest 2 View  Result Date: 08/14/2017 CLINICAL DATA:  Metastatic rectal cancer.  Low-grade fever. EXAM: CHEST  2 VIEW COMPARISON:  08/13/2017 FINDINGS: The lungs are clear without focal pneumonia, edema, pneumothorax or pleural effusion. The cardiopericardial silhouette is within normal limits for size. The visualized bony structures of the thorax are intact. Left Port-A-Cath tip overlies the mid SVC. IMPRESSION: Stable.  No acute findings. Electronically Signed   By: Misty Stanley M.D.   On: 08/14/2017 20:22               Blood pressure (!) 189/94, pulse 76, temperature 99 F (37.2 C), temperature source Oral, resp. rate 20, height 5' 3"  (1.6 m), weight 63 kg (138 lb 14.4 oz), SpO2 98 %.  Physical exam:   General--Very withdrawn African-American woman answers questions and monosyllables ENT-- nonicteric Neck-- no masses Heart-- regular rate and rhythm without murmurs are gallops Lungs-- clear Abdomen-- Mark lower abdominal tenderness, fairly quiet nondistended Psych-- very  withdrawn   Assessment: 1. Severe nausea and vomiting. This is probably multifactorial. She was taking a higher dose of metoclopramide is an outpatient we can go ahead and increase that. Suspicion is that this is a result of her metastatic cancer and medications. 2. Anal rectal cancer. Squamous cancer metastatic to liver and intra-abdominal lymph nodes. Currently being treated with immunotherapy 3. Constipation. The patient states she has had no bowel movement now for 5 days 4. Recent hip replacement. She's requiring oral pain medications still  Plan: we will likely need some input from oncology. Patient states that the thought of food makes her severely nausea didn't vomit. I don't think this is primarily a G.I. problem but trying to get her bowels to move an increase in her metoclopramide may be helpful.   Carlisle Torgeson JR,Vanisha Whiten L 08/16/2017, 11:51 AM   This note was created using voice recognition software and minor errors may Have occurred unintentionally. Pager: [phone] If no answer or after hours call [phone]

## 2017-08-17 LAB — CBC
HCT: 38.8 % (ref 36.0–46.0)
Hemoglobin: 13.5 g/dL (ref 12.0–15.0)
MCH: 26.6 pg (ref 26.0–34.0)
MCHC: 34.8 g/dL (ref 30.0–36.0)
MCV: 76.5 fL — AB (ref 78.0–100.0)
Platelets: 287 10*3/uL (ref 150–400)
RBC: 5.07 MIL/uL (ref 3.87–5.11)
RDW: 15.7 % — AB (ref 11.5–15.5)
WBC: 8.8 10*3/uL (ref 4.0–10.5)

## 2017-08-17 LAB — COMPREHENSIVE METABOLIC PANEL
ALK PHOS: 71 U/L (ref 38–126)
ALT: 9 U/L — AB (ref 14–54)
AST: 14 U/L — AB (ref 15–41)
Albumin: 3.4 g/dL — ABNORMAL LOW (ref 3.5–5.0)
Anion gap: 7 (ref 5–15)
BUN: 6 mg/dL (ref 6–20)
CALCIUM: 8.4 mg/dL — AB (ref 8.9–10.3)
CHLORIDE: 105 mmol/L (ref 101–111)
CO2: 26 mmol/L (ref 22–32)
CREATININE: 0.55 mg/dL (ref 0.44–1.00)
Glucose, Bld: 90 mg/dL (ref 65–99)
Potassium: 2.8 mmol/L — ABNORMAL LOW (ref 3.5–5.1)
Sodium: 138 mmol/L (ref 135–145)
Total Bilirubin: 1.4 mg/dL — ABNORMAL HIGH (ref 0.3–1.2)
Total Protein: 6.4 g/dL — ABNORMAL LOW (ref 6.5–8.1)

## 2017-08-17 MED ORDER — BOOST / RESOURCE BREEZE PO LIQD
1.0000 | Freq: Three times a day (TID) | ORAL | Status: DC
  Administered 2017-08-18: 1 via ORAL

## 2017-08-17 MED ORDER — POTASSIUM CHLORIDE 10 MEQ/50ML IV SOLN
10.0000 meq | INTRAVENOUS | Status: AC
  Administered 2017-08-17 (×6): 10 meq via INTRAVENOUS
  Filled 2017-08-17 (×6): qty 50

## 2017-08-17 MED ORDER — METHOCARBAMOL 500 MG PO TABS
500.0000 mg | ORAL_TABLET | Freq: Three times a day (TID) | ORAL | Status: DC
  Administered 2017-08-17 – 2017-08-19 (×8): 500 mg via ORAL
  Filled 2017-08-17 (×8): qty 1

## 2017-08-17 NOTE — Progress Notes (Signed)
Nutrition Follow-up  INTERVENTION:   Provide Boost Breeze po TID, each supplement provides 250 kcal and 9 grams of protein  RD will continue to monitor  NUTRITION DIAGNOSIS:   Increased nutrient needs related to catabolic illness, cancer and cancer related treatments as evidenced by estimated needs.  Ongoing.  GOAL:   Patient will meet greater than or equal to 90% of their needs  Not meeting.  MONITOR:   PO intake, Diet advancement, Weight trends, Labs  ASSESSMENT:   61 y.o. female with medical history significant of metastatic rectal cancer with metastasis to lymph nodes, liver, lungs on immunotherapy with Nivolumab(Opdivo). Direct admit from [facility] after meeting with Oncologist d/t increasing malaise, weakness, vomiting, and low-grade fever  Patient with more controlled nausea today. For breakfast had 3 apple juices and strawberry and vanilla yogurts. Chart reveals 15% meal completion yesterday. Will order Boost Breeze supplements as patient likes juice. Pt has had a BM 8/25 and 8/26.  Medications: Marinol capsule BID, IV Reglan every 6 hours, IV Protonix BID, Miralax packet BID, IV Zofran every 8 hours, KCl infusion today, IV Phenergan PRN Labs reviewed: Low K  Diet Order:  Diet regular Room service appropriate? Yes; Fluid consistency: Thin  Skin:  Reviewed, no issues  Last BM:  8/25  Height:   Ht Readings from Last 1 Encounters:  08/14/17 5\' 3"  (1.6 m)    Weight:   Wt Readings from Last 1 Encounters:  08/14/17 138 lb 14.4 oz (63 kg)    Ideal Body Weight:  52.27 kg  BMI:  Body mass index is 24.61 kg/m.  Estimated Nutritional Needs:   Kcal:  2015-2205 (32-35 kcal/kg)  Protein:  115-125 grams  Fluid:  >/= 2 L/day  EDUCATION NEEDS:   No education needs identified at this time  Clayton Bibles, MS, RD, LDN Pager: [phone] After Hours Pager: [phone]

## 2017-08-17 NOTE — Progress Notes (Signed)
PROGRESS NOTE    [name]  BWG:[id number] DOB: [birth year] DOA: 08/14/2017 PCP: Damaris Hippo, MD  Brief Narrative:[name] is a 61 y.o. female with medical history significant of metastatic rectal cancer with metastasis to lymph nodes, liver, lungs on immunotherapy with Nivolumab(Opdivo) presented to [facility] long hospital as a direct admission from [place]. Patient's daughter reported that she received her last immunotherapy dose   a week ago on 8/15 following which she had some malaise and constipation, constipation resolved after Miralax, did okay over the weekend and subsequently on Tuesday had increased nausea and constipation requiring miralax and mag citrate, after this started having multiple bowel movements and then on tuesday night had multiple episodes of vomiting after which her family brought her to the emergency room she had an unremarkable workup with labs and a CT scan which was negative for any acute findings and subsequently discharged home yesterday. She presented to [place] center was seen by Dr. Burr Medico due to increasing malaise weakness, vomiting and low-grade fever of 100.8 she did labs and requested direct admission to [facility] service.   Assessment & Plan:   Persistent Nausea/Vomiting -Etiology not clear, on admission had low grade fever, which has since resolved and infectious workup negative  -At this time symptoms appear somewhat to be subacute, likely related to her metastatic cancer -feels a little better and wants to try yogurt, will advance diet so she has more options -She had a CT Abd 8/22 which was negative for any acute abdominal findings -FU Blood Cx-NGTD -CXR and UA/lactate/lipase/LFTs/bili all unremarkable -greatly appreciate GI input, increased IV reglan -continue PPI and IVF     History of DVT -Continue xarelto    Nausea & vomiting -Acute on chronic issue  -Workup as noted above    Metastatic anal cancer   -On palliative immunotherapy  -Dr.Feng aware of admission  DVT prophylaxis: on xarelto Code Status: DNR  Family Communication:  Daughter at bedside Disposition Plan: Home when improved   Consultants:   Dr.Feng    Subjective: Reports feeling a little better, willing to try some yogurt  Objective: Vitals:   08/16/17 1408 08/16/17 2045 08/17/17 0459 08/17/17 1306  BP: (!) 158/81 (!) 158/78 (!) 152/67 134/79  Pulse: 90 67 95 79  Resp: 18 18 18 16   Temp: 100.3 F (37.9 C) 98 F (36.7 C) 98.1 F (36.7 C) 98.3 F (36.8 C)  TempSrc: Oral Oral Oral Oral  SpO2: 98% 99% 98% 99%  Weight:      Height:        Intake/Output Summary (Last 24 hours) at 08/17/17 1337 Last data filed at 08/17/17 1300  Gross per 24 hour  Intake             3730 ml  Output             2150 ml  Net             1580 ml   Filed Weights   08/14/17 1750  Weight: 63 kg (138 lb 14.4 oz)    Examination:  Gen: Awake, Alert, very depressed, chronically ill female HEENT: PERRLA, Neck supple, no JVD Lungs: Good air movement bilaterally, CTAB CVS: RRR,No Gallops,Rubs or new Murmurs Abd: soft, Non tender, non distended, BS present Extremities: No Cyanosis, Clubbing or edema Skin: no new rashes Psychiatry: Depressed, flat affect    Data Reviewed:   CBC:  Recent Labs Lab 08/14/17 1601 08/14/17 1950 08/15/17 0500 08/16/17 0500 08/17/17 0500  WBC 12.8* 12.6* 11.9* 10.6* 8.8  NEUTROABS 9.8*  --   --   --   --   HGB 14.8 14.5 14.3 14.0 13.5  HCT 45.2 41.0 41.0 41.4 38.8  MCV 79.8 76.6* 76.4* 77.1* 76.5*  PLT 372 315 340 331 177   Basic Metabolic Panel:  Recent Labs Lab 08/13/17 0354 08/14/17 1601 08/14/17 1950 08/15/17 0500 08/16/17 0500 08/17/17 0500  NA 140 139 138 141 138 138  K 3.5 3.1* 3.7 2.9* 3.3* 2.8*  CL 104  --  104 108 105 105  CO2 26 28 25 27 23 26   GLUCOSE 180* 108 114* 102* 87 90  BUN 5* 8.2 8 8 8 6   CREATININE 0.62 0.8 0.60 0.58 0.61 0.55  CALCIUM 9.3 9.7 8.8*  9.0 8.5* 8.4*   GFR: Estimated Creatinine Clearance: 66.8 mL/min (by C-G formula based on SCr of 0.55 mg/dL). Liver Function Tests:  Recent Labs Lab 08/13/17 0354 08/14/17 1601 08/14/17 1950 08/16/17 0500 08/17/17 0500  AST 17 21 23 15  14*  ALT 10* 14 16 14  9*  ALKPHOS 98 115 91 80 71  BILITOT 0.6 0.91 1.1 1.3* 1.4*  PROT 7.5 8.2 7.6 7.0 6.4*  ALBUMIN 4.1 4.1 4.2 3.6 3.4*    Recent Labs Lab 08/13/17 0353 08/14/17 1950  LIPASE 20 24   No results for input(s): AMMONIA in the last 168 hours. Coagulation Profile: No results for input(s): INR, PROTIME in the last 168 hours. Cardiac Enzymes: No results for input(s): CKTOTAL, CKMB, CKMBINDEX, TROPONINI in the last 168 hours. BNP (last 3 results) No results for input(s): PROBNP in the last 8760 hours. HbA1C: No results for input(s): HGBA1C in the last 72 hours. CBG: No results for input(s): GLUCAP in the last 168 hours. Lipid Profile: No results for input(s): CHOL, HDL, LDLCALC, TRIG, CHOLHDL, LDLDIRECT in the last 72 hours. Thyroid Function Tests: No results for input(s): TSH, T4TOTAL, FREET4, T3FREE, THYROIDAB in the last 72 hours. Anemia Panel: No results for input(s): VITAMINB12, FOLATE, FERRITIN, TIBC, IRON, RETICCTPCT in the last 72 hours. Urine analysis:    Component Value Date/Time   COLORURINE YELLOW 08/15/2017 0146   APPEARANCEUR CLEAR 08/15/2017 0146   LABSPEC 1.010 08/15/2017 0146   PHURINE 8.0 08/15/2017 0146   GLUCOSEU NEGATIVE 08/15/2017 0146   HGBUR SMALL (A) 08/15/2017 0146   BILIRUBINUR NEGATIVE 08/15/2017 0146   KETONESUR 20 (A) 08/15/2017 0146   PROTEINUR 100 (A) 08/15/2017 0146   NITRITE NEGATIVE 08/15/2017 0146   LEUKOCYTESUR NEGATIVE 08/15/2017 0146   Sepsis Labs: @LABRCNTIP (procalcitonin:4,lacticidven:4)  ) Recent Results (from the past 240 hour(s))  Culture, Urine     Status: Abnormal   Collection Time: 08/13/17  6:30 AM  Result Value Ref Range Status   Specimen Description URINE,  CLEAN CATCH  Final   Special Requests NONE  Final   Culture (A)  Final    <10,000 COLONIES/mL Performed at [facility], [place] [address] [place], [place] [address]    Report Status 08/16/2017 FINAL  Final  Culture, blood (routine x 2)     Status: None (Preliminary result)   Collection Time: 08/14/17  7:45 PM  Result Value Ref Range Status   Specimen Description BLOOD LEFT HAND  Final   Special Requests IN PEDIATRIC BOTTLE Blood Culture adequate volume  Final   Culture   Final    NO GROWTH 2 DAYS Performed at [facility], [place] [address], [place], [place] [address]    Report Status PENDING  Incomplete  Culture, blood (routine x 2)     Status: None (Preliminary result)   Collection Time: 08/14/17  8:03 PM  Result Value Ref Range Status   Specimen Description BLOOD LEFT HAND  Final   Special Requests IN PEDIATRIC BOTTLE Blood Culture adequate volume  Final   Culture   Final    NO GROWTH 2 DAYS Performed at [facility], [place] [address], [place], [place] [address]    Report Status PENDING  Incomplete         Radiology Studies: No results found.      Scheduled Meds: . dronabinol  5 mg Oral BID AC  . methocarbamol  500 mg Oral TID  . metoCLOPramide (REGLAN) injection  10 mg Intravenous Q6H  . pantoprazole (PROTONIX) IV  40 mg Intravenous Q12H  . polyethylene glycol  17 g Oral BID  . rivaroxaban  20 mg Oral Q supper  . scopolamine  1 patch Transdermal Q72H   Continuous Infusions: . sodium chloride 75 mL/hr at 08/17/17 0409  . ondansetron (ZOFRAN) IV Stopped (08/17/17 7544)  . potassium chloride Stopped (08/17/17 1212)     LOS: 3 days    Time spent: 30min    Domenic Polite, MD [facility] Pager [phone]  If 7PM-7AM, please contact night-coverage [email or web address] Password [email or web address] 08/17/2017, 1:37 PM

## 2017-08-17 NOTE — Progress Notes (Signed)
[facility] PROGRESS NOTE Subjective Patient reports that the change in her medication has helped in that she is somewhat less nauseated. She still is having issues swallowing and feeling a little nauseatingly but notes that it is better. She has refused the Marinol because she feels it may make her throw up.  Objective: Vital signs in last 24 hours: Temp:  [98 F (36.7 C)-100.3 F (37.9 C)] 98.1 F (36.7 C) (08/26 0459) Pulse Rate:  [67-95] 95 (08/26 0459) Resp:  [18] 18 (08/26 0459) BP: (152-158)/(67-81) 152/67 (08/26 0459) SpO2:  [98 %-99 %] 98 % (08/26 0459) Last BM Date: 08/16/17  Intake/Output from previous day: 08/25 0701 - 08/26 0700 In: 3435 [P.O.:460; I.V.:2758; IV Piggyback:217] Out: 2300 [Urine:2300] Intake/Output this shift: Total I/O In: 55 [IV Piggyback:55] Out: -   PE: General--more alert and responsive  Abdomen--minimal tenderness.  Lab Results:  Recent Labs  08/14/17 1601 08/14/17 1950 08/15/17 0500 08/16/17 0500 08/17/17 0500  WBC 12.8* 12.6* 11.9* 10.6* 8.8  HGB 14.8 14.5 14.3 14.0 13.5  HCT 45.2 41.0 41.0 41.4 38.8  PLT 372 315 340 331 287   BMET  Recent Labs  08/14/17 1601 08/14/17 1950 08/15/17 0500 08/16/17 0500 08/17/17 0500  NA 139 138 141 138 138  K 3.1* 3.7 2.9* 3.3* 2.8*  CL  --  104 108 105 105  CO2 28 25 27 23 26   CREATININE 0.8 0.60 0.58 0.61 0.55   LFT  Recent Labs  08/14/17 1950 08/16/17 0500 08/17/17 0500  PROT 7.6 7.0 6.4*  AST 23 15 14*  ALT 16 14 9*  ALKPHOS 91 80 71  BILITOT 1.1 1.3* 1.4*   PT/INR No results for input(s): LABPROT, INR in the last 72 hours. PANCREAS  Recent Labs  08/14/17 1950  LIPASE 24         Studies/Results: No results found.  Medications: I have reviewed the patient's current medications.  Assessment/Plan: 1. Nausea and vomiting. The patient does appear to be better with changed to IV medications. Have explained to her that the [facility] a marijuana  derivative and did decrease the nausea sensation and that she ought to try that. The plan would be to see how she does with liquids and oral intake and hopefully be able to switch her back over to oral medications.   Joline Encalada JR,Jasa Dundon L 08/17/2017, 8:42 AM  This note was created using voice recognition software. Minor errors may Have occurred unintentionally.  Pager: [phone] If no answer or after hours call [phone]

## 2017-08-18 LAB — COMPREHENSIVE METABOLIC PANEL
ALT: 8 U/L — ABNORMAL LOW (ref 14–54)
ANION GAP: 4 — AB (ref 5–15)
AST: 11 U/L — AB (ref 15–41)
Albumin: 2.9 g/dL — ABNORMAL LOW (ref 3.5–5.0)
Alkaline Phosphatase: 65 U/L (ref 38–126)
BUN: 7 mg/dL (ref 6–20)
CHLORIDE: 110 mmol/L (ref 101–111)
CO2: 26 mmol/L (ref 22–32)
Calcium: 8.5 mg/dL — ABNORMAL LOW (ref 8.9–10.3)
Creatinine, Ser: 0.58 mg/dL (ref 0.44–1.00)
Glucose, Bld: 91 mg/dL (ref 65–99)
POTASSIUM: 3.7 mmol/L (ref 3.5–5.1)
Sodium: 140 mmol/L (ref 135–145)
TOTAL PROTEIN: 5.7 g/dL — AB (ref 6.5–8.1)
Total Bilirubin: 0.8 mg/dL (ref 0.3–1.2)

## 2017-08-18 LAB — CBC
HEMATOCRIT: 35.6 % — AB (ref 36.0–46.0)
HEMOGLOBIN: 12.1 g/dL (ref 12.0–15.0)
MCH: 26.2 pg (ref 26.0–34.0)
MCHC: 34 g/dL (ref 30.0–36.0)
MCV: 77.1 fL — AB (ref 78.0–100.0)
Platelets: 265 10*3/uL (ref 150–400)
RBC: 4.62 MIL/uL (ref 3.87–5.11)
RDW: 16.1 % — ABNORMAL HIGH (ref 11.5–15.5)
WBC: 7 10*3/uL (ref 4.0–10.5)

## 2017-08-18 NOTE — Progress Notes (Signed)
[facility] Progress Note  [name] 61 y.o. [birth year]  CC:  Nausea and vomiting   Subjective: Patient's symptoms are improving. Denied further vomiting. She is having loose stools with one to 2 bowel movement per day since yesterday. Denied any blood in the stool.  ROS : Negative for chest pain and shortness of breath. Negative for abdominal pain.   Objective: Vital signs in last 24 hours: Vitals:   08/17/17 2034 08/18/17 0416  BP: 134/74 118/65  Pulse: 76 70  Resp: 18 18  Temp: 99 F (37.2 C) 98.4 F (36.9 C)  SpO2: 98% 99%    Physical Exam:  General:  Alert, cooperative, no distress, appears stated age  Head:  Normocephalic, without obvious abnormality, atraumatic  Eyes:  , EOM's intact,   Lungs:   Clear to auscultation bilaterally, respirations unlabored  Heart:  Regular rate and rhythm, S1, S2 normal  Abdomen:   Soft, non-tender, bowel sounds active all four quadrants,  no masses,   Extremities: Extremities normal, atraumatic, no  edema  Pulses: 2+ and symmetric    Lab Results:  Recent Labs  08/17/17 0500 08/18/17 0500  NA 138 140  K 2.8* 3.7  CL 105 110  CO2 26 26  GLUCOSE 90 91  BUN 6 7  CREATININE 0.55 0.58  CALCIUM 8.4* 8.5*    Recent Labs  08/17/17 0500 08/18/17 0500  AST 14* 11*  ALT 9* 8*  ALKPHOS 71 65  BILITOT 1.4* 0.8  PROT 6.4* 5.7*  ALBUMIN 3.4* 2.9*    Recent Labs  08/17/17 0500 08/18/17 0500  WBC 8.8 7.0  HGB 13.5 12.1  HCT 38.8 35.6*  MCV 76.5* 77.1*  PLT 287 265   No results for input(s): LABPROT, INR in the last 72 hours.    Assessment/Plan: - Nausea and vomiting. Improving. 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 On immunotherapy with Nivolumab.  - Loose stools.  Recommendations ---------------------------- - Patient's symptoms are improving. Continue current management. - Transition to oral medication as clinically indicated - If continues to have  loose stools, recommend stool studies for further evaluation. - GI will follow  Otis Brace MD, [facility] 08/18/2017, 12:02 PM  Pager [phone]  If no answer or after 5 PM call [phone]

## 2017-08-18 NOTE — Progress Notes (Signed)
PROGRESS NOTE    [name]  YDX:[id number] DOB: 05-25-1956 DOA: 08/14/2017 PCP: Damaris Hippo, MD  Brief Narrative:[name] is a 61 y.o. female with medical history significant of metastatic rectal cancer with metastasis to lymph nodes, liver, lungs on immunotherapy with Nivolumab(Opdivo) presented to [facility] long hospital as a direct admission from [place]. Patient's daughter reported that she received her last immunotherapy dose   a week ago on 8/15 following which she had some malaise and constipation, constipation resolved after Miralax, did okay over the weekend and subsequently on Tuesday had increased nausea and constipation requiring miralax and mag citrate, after this started having multiple bowel movements and then on tuesday night had multiple episodes of vomiting after which her family brought her to the emergency room she had an unremarkable workup with labs and a CT scan which was negative for any acute findings and subsequently discharged home yesterday. She presented to [place] center was seen by Dr. Burr Medico due to increasing malaise weakness, vomiting and low-grade fever of 100.8 she did labs and requested direct admission to [facility] service.   Assessment & Plan:   Persistent Nausea/Vomiting -on admission had low grade fever, which has since resolved and infectious workup negative  -At this time symptoms appear somewhat to be subacute, likely related to her metastatic cancer/meds etc -slowly improving, continue regular diet for more options-even though po intake minimal -She had a CT Abd 8/22 which was negative for any acute abdominal findings -FU Blood Cx-NGTD -CXR and UA/lactate/lipase/LFTs/bili all unremarkable -greatly appreciate GI input, increased IV reglan, continue marinol, discussed remeron-she is reluctant to add another medicine -continue PPI, cut down IVF     History of DVT -Continue xarelto    Nausea & vomiting -Acute  on chronic issue  -Workup as noted above    Metastatic anal cancer  -On palliative immunotherapy  -Dr.Feng aware of admission  DVT prophylaxis: on xarelto Code Status: DNR  Family Communication:  Daughter at bedside 8/25 Disposition Plan: Home in [place]   Consultants:   Dr.Feng    Subjective: Feels a little better, no vomiting, nausea improving  Objective: Vitals:   08/17/17 1306 08/17/17 2034 08/18/17 0416 08/18/17 1248  BP: 134/79 134/74 118/65 118/77  Pulse: 79 76 70 (!) 55  Resp: 16 18 18 16   Temp: 98.3 F (36.8 C) 99 F (37.2 C) 98.4 F (36.9 C) 98.7 F (37.1 C)  TempSrc: Oral Oral Oral Oral  SpO2: 99% 98% 99% 99%  Weight:      Height:        Intake/Output Summary (Last 24 hours) at 08/18/17 1345 Last data filed at 08/18/17 1100  Gross per 24 hour  Intake             2247 ml  Output             1225 ml  Net             1022 ml   Filed Weights   08/14/17 1750  Weight: 63 kg (138 lb 14.4 oz)    Examination:  Gen: Awake, Alert, Oriented, depressed, chronically ill female  HEENT: PERRLA, Neck supple, no JVD Lungs: Good air movement bilaterally, CTAB CVS: RRR,No Gallops,Rubs or new Murmurs Abd: soft, Non tender, non distended, BS present Extremities: No Cyanosis, Clubbing or edema Skin: no new rashes Psychiatry: Depressed, flat affect    Data Reviewed:   CBC:  Recent Labs Lab 08/14/17 1601 08/14/17 1950 08/15/17 0500 08/16/17 0500 08/17/17  0500 08/18/17 0500  WBC 12.8* 12.6* 11.9* 10.6* 8.8 7.0  NEUTROABS 9.8*  --   --   --   --   --   HGB 14.8 14.5 14.3 14.0 13.5 12.1  HCT 45.2 41.0 41.0 41.4 38.8 35.6*  MCV 79.8 76.6* 76.4* 77.1* 76.5* 77.1*  PLT 372 315 340 331 287 591   Basic Metabolic Panel:  Recent Labs Lab 08/14/17 1950 08/15/17 0500 08/16/17 0500 08/17/17 0500 08/18/17 0500  NA 138 141 138 138 140  K 3.7 2.9* 3.3* 2.8* 3.7  CL 104 108 105 105 110  CO2 25 27 23 26 26   GLUCOSE 114* 102* 87 90 91  BUN 8 8 8 6 7     CREATININE 0.60 0.58 0.61 0.55 0.58  CALCIUM 8.8* 9.0 8.5* 8.4* 8.5*   GFR: Estimated Creatinine Clearance: 66.8 mL/min (by C-G formula based on SCr of 0.58 mg/dL). Liver Function Tests:  Recent Labs Lab 08/14/17 1601 08/14/17 1950 08/16/17 0500 08/17/17 0500 08/18/17 0500  AST 21 23 15  14* 11*  ALT 14 16 14  9* 8*  ALKPHOS 115 91 80 71 65  BILITOT 0.91 1.1 1.3* 1.4* 0.8  PROT 8.2 7.6 7.0 6.4* 5.7*  ALBUMIN 4.1 4.2 3.6 3.4* 2.9*    Recent Labs Lab 08/13/17 0353 08/14/17 1950  LIPASE 20 24   No results for input(s): AMMONIA in the last 168 hours. Coagulation Profile: No results for input(s): INR, PROTIME in the last 168 hours. Cardiac Enzymes: No results for input(s): CKTOTAL, CKMB, CKMBINDEX, TROPONINI in the last 168 hours. BNP (last 3 results) No results for input(s): PROBNP in the last 8760 hours. HbA1C: No results for input(s): HGBA1C in the last 72 hours. CBG: No results for input(s): GLUCAP in the last 168 hours. Lipid Profile: No results for input(s): CHOL, HDL, LDLCALC, TRIG, CHOLHDL, LDLDIRECT in the last 72 hours. Thyroid Function Tests: No results for input(s): TSH, T4TOTAL, FREET4, T3FREE, THYROIDAB in the last 72 hours. Anemia Panel: No results for input(s): VITAMINB12, FOLATE, FERRITIN, TIBC, IRON, RETICCTPCT in the last 72 hours. Urine analysis:    Component Value Date/Time   COLORURINE YELLOW 08/15/2017 0146   APPEARANCEUR CLEAR 08/15/2017 0146   LABSPEC 1.010 08/15/2017 0146   PHURINE 8.0 08/15/2017 0146   GLUCOSEU NEGATIVE 08/15/2017 0146   HGBUR SMALL (A) 08/15/2017 0146   BILIRUBINUR NEGATIVE 08/15/2017 0146   KETONESUR 20 (A) 08/15/2017 0146   PROTEINUR 100 (A) 08/15/2017 0146   NITRITE NEGATIVE 08/15/2017 0146   LEUKOCYTESUR NEGATIVE 08/15/2017 0146   Sepsis Labs: @LABRCNTIP (procalcitonin:4,lacticidven:4)  ) Recent Results (from the past 240 hour(s))  Culture, Urine     Status: Abnormal   Collection Time: 08/13/17  6:30 AM   Result Value Ref Range Status   Specimen Description URINE, CLEAN CATCH  Final   Special Requests NONE  Final   Culture (A)  Final    <10,000 COLONIES/mL Performed at [facility], [place] [address] [place], [place] [address]    Report Status 08/16/2017 FINAL  Final  Culture, blood (routine x 2)     Status: None (Preliminary result)   Collection Time: 08/14/17  7:45 PM  Result Value Ref Range Status   Specimen Description BLOOD LEFT HAND  Final   Special Requests IN PEDIATRIC BOTTLE Blood Culture adequate volume  Final   Culture   Final    NO GROWTH 2 DAYS Performed at [facility], [place] [address], [place], [place] [address]    Report Status  PENDING  Incomplete  Culture, blood (routine x 2)     Status: None (Preliminary result)   Collection Time: 08/14/17  8:03 PM  Result Value Ref Range Status   Specimen Description BLOOD LEFT HAND  Final   Special Requests IN PEDIATRIC BOTTLE Blood Culture adequate volume  Final   Culture   Final    NO GROWTH 2 DAYS Performed at [facility], [address], [place], [place] [address]    Report Status PENDING  Incomplete         Radiology Studies: No results found.      Scheduled Meds: . dronabinol  5 mg Oral BID AC  . feeding supplement  1 Container Oral TID BM  . methocarbamol  500 mg Oral TID  . metoCLOPramide (REGLAN) injection  10 mg Intravenous Q6H  . pantoprazole (PROTONIX) IV  40 mg Intravenous Q12H  . polyethylene glycol  17 g Oral BID  . rivaroxaban  20 mg Oral Q supper  . scopolamine  1 patch Transdermal Q72H   Continuous Infusions: . sodium chloride 75 mL/hr at 08/18/17 1127  . ondansetron (ZOFRAN) IV Stopped (08/18/17 0551)     LOS: 4 days    Time spent: 26min    Domenic Polite, MD [facility] Pager [phone]  If 7PM-7AM, please contact night-coverage [email or web address] Password [email or web address] 08/18/2017, 1:45 PM

## 2017-08-19 ENCOUNTER — Encounter: Payer: Self-pay | Admitting: General Practice

## 2017-08-19 ENCOUNTER — Telehealth: Payer: Self-pay

## 2017-08-19 DIAGNOSIS — R6510 Systemic inflammatory response syndrome (SIRS) of non-infectious origin without acute organ dysfunction: Secondary | ICD-10-CM

## 2017-08-19 MED ORDER — HEPARIN SOD (PORK) LOCK FLUSH 100 UNIT/ML IV SOLN
500.0000 [IU] | Freq: Once | INTRAVENOUS | Status: AC
  Administered 2017-08-19: 500 [IU] via INTRAVENOUS
  Filled 2017-08-19: qty 5

## 2017-08-19 NOTE — Discharge Summary (Addendum)
Physician Discharge Summary  [name] ZOX:[id number] DOB: [birth year] DOA: 08/14/2017  PCP: Damaris Hippo, MD  Admit date: 08/14/2017 Discharge date: 08/19/2017  Time spent: 35 minutes  Recommendations for Outpatient Follow-up:  1. 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 Patient's daughter reported that she received her last immunotherapy dose  a week ago on 8/15 following which she had some malaise and constipation, constipation resolved after Miralax, did okay over the weekend and subsequently on Tuesday had increased nausea and constipation requiring miralax and mag citrate,after this started having multiple bowel movements and then on tuesday night had multiple episodes of vomiting after which her family brought her to the emergency room she had an unremarkable workup with labs and a CT scan which was negative for any acute findings and subsequently discharged home 8/22. She presented to [place] center 8/23 was seen by Dr. Burr Medico due to increasing malaise weakness, vomiting and low-grade fever of 100.8 she did labs and requested direct admission to [facility] service.   Hospital Course:   Persistent Nausea/Vomiting -on admission had low grade fever, which has since resolved and infectious workup negative  -At this time  symptoms appear somewhat to be subacute, likely related to her metastatic cancer/immunotherapy etc -slowly improving, with supportive care, antiemetics -She had a CT Abd8/22 which was negative for any acute abdominal findings -FU Blood Cx-NGTD -CXR and UA/lactate/lipase/LFTs/bili all unremarkable -[facility] consulted, GI felt that her symptoms likely from her metastatic cancer/immunotherapy etc- we increased IV reglan, continued marinol and laxatives -since PO intake has improved, no further vomiting, nausea better, even though Po intake still not great, she is felt to have achieved maximal benefit from inpatient care and discharged home.  History of DVT -Continue xarelto  Nausea &vomiting -Acute on chronic issue  -Workup as noted above   Metastatic rectal cancer  -On palliative immunotherapy  -Dr.Feng following  Consultations: [facility] Discharge Exam: Vitals:   08/19/17 0501 08/19/17 1355  BP: 104/64 106/63  Pulse: 65 71  Resp: 16 16  Temp: 98.3 F (36.8 C) 98.2 F (36.8 C)  SpO2: 98% 97%    General: AAOx3 Cardiovascular: S1S2/RRR Respiratory: CTAB  Discharge Instructions   Discharge Instructions    Diet general    Complete by:  As directed    Increase activity slowly    Complete by:  As directed      Current Discharge Medication List    CONTINUE these medications which have NOT CHANGED   Details  acetaminophen-codeine (TYLENOL #3) 300-30 MG tablet Take 1 tablet by mouth every 12 (twelve) hours as needed for moderate pain. Qty: 40 tablet, Refills: 0    docusate sodium (COLACE) 100 MG capsule Take 1 capsule (100 mg total) by mouth 2 (two) times daily. Qty: 10 capsule, Refills: 0    dronabinol (MARINOL) 5 MG capsule Take 1  capsule (5 mg total) by mouth 2 (two) times daily before a meal. Qty: 60 capsule, Refills: 0   Associated Diagnoses: Anal cancer ([place]); Intractable vomiting with nausea, unspecified vomiting type    LACTULOSE PO Take 1 Package  by mouth daily as needed (constipation).    Associated Diagnoses: Anal cancer ([place])    lidocaine-prilocaine (EMLA) cream Apply 1 application topically as needed. Qty: 30 g, Refills: 6    loperamide (IMODIUM A-D) 2 MG tablet Take 1 tablet (2 mg total) by mouth 4 (four) times daily as needed for diarrhea or loose stools. Qty: 30 tablet, Refills: 0    magnesium citrate SOLN Take 1 Bottle by mouth once.    methocarbamol (ROBAXIN) 500 MG tablet Take 1 tablet (500 mg total) by mouth every 6 (six) hours as needed for muscle spasms. Qty: 40 tablet, Refills: 0    metoCLOPramide (REGLAN) 10 MG tablet Take 1 tablet (10 mg total) by mouth 4 (four) times daily -  before meals and at bedtime. Qty: 120 tablet, Refills: 0    Multiple Vitamins-Minerals (CENTRUM ADULTS) TABS Take 1 tablet by mouth daily.    omeprazole (PRILOSEC OTC) 20 MG tablet Take 20 mg by mouth daily as needed (for acid reflex).    polyethylene glycol (MIRALAX / GLYCOLAX) packet Take 17 g by mouth 2 (two) times daily. Qty: 14 each, Refills: 0    promethazine (PHENERGAN) 25 MG tablet Take 1 tablet (25 mg total) by mouth every 6 (six) hours as needed for nausea or vomiting. Qty: 12 tablet, Refills: 1    rivaroxaban (XARELTO) 20 MG TABS tablet Take 1 tablet (20 mg total) by mouth daily with supper. Please follow up with PCP to continue this medication. Qty: 30 tablet, Refills: 0    saccharomyces boulardii (FLORASTOR) 250 MG capsule Take 250 mg by mouth daily.    traMADol (ULTRAM) 50 MG tablet Take 1 tablet (50 mg total) by mouth every 6 (six) hours as needed. Qty: 30 tablet, Refills: 0    ondansetron (ZOFRAN ODT) 4 MG disintegrating tablet Take 1 tablet (4 mg total) by mouth every 8 (eight) hours as needed for nausea or vomiting. Qty: 12 tablet, Refills: 1       Allergies  Allergen Reactions  . Ativan [Lorazepam] Other (See Comments)    Loss of memory and change in behavior.  . Penicillins Itching    Has patient had a PCN  reaction causing immediate rash, facial/tongue/throat swelling, SOB or lightheadedness with hypotension: Unknown Has patient had a PCN reaction causing severe rash involving mucus membranes or skin necrosis: Unknown Has patient had a PCN reaction that required hospitalization Unknown Has patient had a PCN reaction occurring within the last 10 years: Unknown Daughter states pt cant take any derivative of penicillin either   Follow-up Information    Truitt Merle, MD. Schedule an appointment as soon as possible for a visit in 1 week(s).   Specialties:  Hematology, Oncology Contact information: [place] [address] [phone]            The results of significant diagnostics from this hospitalization (including imaging, microbiology, ancillary and laboratory) are listed below for reference.    Significant Diagnostic Studies: Dg Chest 2 View  Result Date: 08/14/2017 CLINICAL DATA:  Metastatic rectal cancer.  Low-grade fever. EXAM: CHEST  2 VIEW COMPARISON:  08/13/2017 FINDINGS: The lungs are clear without focal pneumonia, edema, pneumothorax or pleural effusion. The cardiopericardial silhouette is within normal limits for size. The  visualized bony structures of the thorax are intact. Left Port-A-Cath tip overlies the mid SVC. IMPRESSION: Stable.  No acute findings. Electronically Signed   By: Misty Stanley M.D.   On: 08/14/2017 20:22   Dg Chest 2 View  Result Date: 07/26/2017 CLINICAL DATA:  New onset of shortness of breath. History of anal cancer. EXAM: CHEST  2 VIEW COMPARISON:  PET-CT 05/13/2017 FINDINGS: Left jugular Port-A-Cath is present. Catheter tip in the lower SVC. Both lungs are clear. No focal airspace disease or pulmonary edema. Negative for a pneumothorax. No large pleural effusions. Bridging osteophytes in the thoracic spine. IMPRESSION: No active cardiopulmonary disease. Electronically Signed   By: Markus Daft M.D.   On: 07/26/2017 10:43   Ct Abdomen  Pelvis W Contrast  Result Date: 08/13/2017 CLINICAL DATA:  Nausea, vomiting, bowel obstruction EXAM: CT ABDOMEN AND PELVIS WITH CONTRAST TECHNIQUE: Multidetector CT imaging of the abdomen and pelvis was performed using the standard protocol following bolus administration of intravenous contrast. CONTRAST:  179mL ISOVUE-300 IOPAMIDOL (ISOVUE-300) INJECTION 61% COMPARISON:  Plain films 08/13/2017.  PET CT 08/01/2017 FINDINGS: Lower chest: Lung bases are clear. No effusions. Heart is normal size. Hepatobiliary: Hepatic metastases again noted as seen on recent PET CT. Index left hepatic lesion measures up to 3.1 cm, stable. Remainder the hepatic metastases appear stable. Right hepatic cysts also noted, stable. No biliary ductal dilatation. Gallbladder unremarkable. Pancreas: No focal abnormality or ductal dilatation. Spleen: No focal abnormality.  Normal size. Adrenals/Urinary Tract: Left renal parapelvic cysts. No hydronephrosis. Adrenal glands and urinary bladder are unremarkable. Stomach/Bowel: Abnormal wall thickening within the rectum with large posterior wall rectal mass on image 60 measuring 3.2 cm, stable since prior study. There is presacral soft tissue thickening, stable. No evidence of bowel obstruction. Vascular/Lymphatic: Pathologic retroperitoneal adenopathy again noted. Index bulky left retroperitoneal adenopathy measures 4 x 3.3 cm, stable. No evidence of aneurysm. Reproductive: Uterus and adnexa unremarkable.  No mass. Other: No free fluid or free air. Musculoskeletal: Prior left hip replacement. No acute bony abnormality. IMPRESSION: Stable rectal wall thickening and posterior rectal wall mass compatible with patient's history of rectal cancer. Stable hepatic metastases and bulky retroperitoneal metastases. No evidence of bowel obstruction. Electronically Signed   By: Rolm Baptise M.D.   On: 08/13/2017 10:51   Nm Pet Image Restag (ps) Skull Base To Thigh  Result Date: 08/01/2017 CLINICAL DATA:   Subsequent treatment strategy for anal cancer. EXAM: NUCLEAR MEDICINE PET SKULL BASE TO THIGH TECHNIQUE: 7.1 mCi F-18 FDG was injected intravenously. Full-ring PET imaging was performed from the skull base to thigh after the radiotracer. CT data was obtained and used for attenuation correction and anatomic localization. FASTING BLOOD GLUCOSE:  Value: 91 mg/dl COMPARISON:  Multiple exams, including 05/13/2017 FINDINGS: NECK The mass extending in the supraclavicular fossa on the left measures 2.6 by 3.5 cm on image 42/4, formerly 4.0 by 3.9 cm, with maximum SUV 6.1 (formerly 9.5). This partially extends down into the upper mediastinum. CHEST A lymph node posterior to the left mainstem bronchus measures 0.6 cm in short axis on image 60/4 (previously 1.2 cm) with maximum standard uptake value of 2.3 (formerly 9.5). At the site of the previous 1.2 by 1.0 cm superior segment left lower lobe pulmonary nodule, there is only faint linear density as shown on image 18/8. The previous hypermetabolic activity in this vicinity, which previously had a maximum SUV of 3.9, has completely resolved. No new nodules. Left Port-A-Cath tip:  Lower SVC. ABDOMEN/PELVIS There are new  and enlarged hypodense lesions scattered in the liver, including a 3.1 by 2.6 cm mass in the lateral segment left hepatic lobe on image 95/4 which previously measured 1.7 by 2.1 cm. This mass has a maximum SUV of 5.8 (formerly 4.5). An adjacent 2.5 by 1.8 cm mass in the lateral segment left hepatic lobe has maximum standard uptake value of 6.6 (formerly 4.5). A new lesion more cephalad in the lateral segment left hepatic lobe measures 1.7 by 1.2 cm with a maximum SUV of 5.8. Pathologic retroperitoneal adenopathy is again identified, with the dominant cluster of left periaortic lymph nodes measuring 4.1 by 3.5 cm on image 122/4 (formerly 4.3 by 3.5 cm) with maximum SUV of this cluster 10.4 (formerly 12.5). Other hypermetabolic retroperitoneal lymph nodes are also  observed, similar size and slightly reduced activity compared prior. Accentuated metabolic activity in what appears to be a mass posterior to the rectum new likely with central necrosis, maximum SUV 9.0, formerly new 8.2. Hypermetabolic left internal iliac adenopathy is again noted. A small subcutaneous nodule along the right upper bilaterally is faintly hypermetabolic with a maximum SUV of 3.9, formerly 2.9. This is stable in size and has a small fatty center. Stable fullness of the left mid kidney collecting system but without overt hydronephrosis identified, some of this may be due to a peripelvic cysts. Punctate calcification along the left kidney upper pole. Fullness of the left adrenal gland without discrete mass. Prominent stool throughout the colon favors constipation. Aortoiliac atherosclerotic vascular disease. SKELETON No focal hypermetabolic activity to suggest skeletal metastasis. Left hip prosthesis. IMPRESSION: 1. Mixed appearance of response, with significant improvement in the thoracic metastatic disease ; mild improvement in the abdominal adenopathy ; new and worsening metastatic disease in the liver; and a relatively stable left posterior perirectal mass. 2. Other imaging findings of potential clinical significance: Aortic Atherosclerosis (ICD10-I70.0). Prominent stool throughout the colon favors constipation. Electronically Signed   By: Van Clines M.D.   On: 08/01/2017 12:23   Dg Abdomen Acute W/chest  Result Date: 08/13/2017 CLINICAL DATA:  Abdominal pain and constipation.  Vomiting. EXAM: DG ABDOMEN ACUTE W/ 1V CHEST COMPARISON:  Head CT 08/01/2017 FINDINGS: Tip of the left chest port in the mid SVC. Normal heart size and mediastinal contours. No consolidation, pleural fluid or pneumothorax. Mild gaseous distention and air-fluid level within the transverse and descending colon. No significant formed stool is seen. No bowel dilatation to suggest obstruction. No free air. Pelvic  phleboliths. No radiopaque calculi. Left hip arthroplasty. IMPRESSION: 1. Air-fluid levels within the transverse and descending colon, consistent with liquid stool. No significant formed stool. No dilated bowel loops to suggest obstruction. 2.  No acute pulmonary process. Electronically Signed   By: Jeb Levering M.D.   On: 08/13/2017 06:57    Microbiology: Recent Results (from the past 240 hour(s))  Culture, Urine     Status: Abnormal   Collection Time: 08/13/17  6:30 AM  Result Value Ref Range Status   Specimen Description URINE, CLEAN CATCH  Final   Special Requests NONE  Final   Culture (A)  Final    <10,000 COLONIES/mL Performed at [facility], [place] [address] [place], [place] [address]    Report Status 08/16/2017 FINAL  Final  Culture, blood (routine x 2)     Status: None (Preliminary result)   Collection Time: 08/14/17  7:45 PM  Result Value Ref Range Status   Specimen Description BLOOD LEFT HAND  Final   Special Requests IN  PEDIATRIC BOTTLE Blood Culture adequate volume  Final   Culture   Final    NO GROWTH 4 DAYS Performed at [facility], [place] [address] [place], [place] [address]    Report Status PENDING  Incomplete  Culture, blood (routine x 2)     Status: None (Preliminary result)   Collection Time: 08/14/17  8:03 PM  Result Value Ref Range Status   Specimen Description BLOOD LEFT HAND  Final   Special Requests IN PEDIATRIC BOTTLE Blood Culture adequate volume  Final   Culture   Final    NO GROWTH 4 DAYS Performed at [facility], [place] [address], [place], [place] [address]    Report Status PENDING  Incomplete     Labs: Basic Metabolic Panel:  Recent Labs Lab 08/14/17 1950 08/15/17 0500 08/16/17 0500 08/17/17 0500 08/18/17 0500  NA 138 141 138 138 140  K 3.7 2.9* 3.3* 2.8* 3.7  CL 104 108 105 105 110  CO2 25 27 23 26 26   GLUCOSE 114* 102* 87 90 91  BUN 8 8 8 6 7   CREATININE 0.60 0.58 0.61 0.55 0.58  CALCIUM 8.8* 9.0 8.5* 8.4*  8.5*   Liver Function Tests:  Recent Labs Lab 08/14/17 1601 08/14/17 1950 08/16/17 0500 08/17/17 0500 08/18/17 0500  AST 21 23 15  14* 11*  ALT 14 16 14  9* 8*  ALKPHOS 115 91 80 71 65  BILITOT 0.91 1.1 1.3* 1.4* 0.8  PROT 8.2 7.6 7.0 6.4* 5.7*  ALBUMIN 4.1 4.2 3.6 3.4* 2.9*    Recent Labs Lab 08/13/17 0353 08/14/17 1950  LIPASE 20 24   No results for input(s): AMMONIA in the last 168 hours. CBC:  Recent Labs Lab 08/14/17 1601 08/14/17 1950 08/15/17 0500 08/16/17 0500 08/17/17 0500 08/18/17 0500  WBC 12.8* 12.6* 11.9* 10.6* 8.8 7.0  NEUTROABS 9.8*  --   --   --   --   --   HGB 14.8 14.5 14.3 14.0 13.5 12.1  HCT 45.2 41.0 41.0 41.4 38.8 35.6*  MCV 79.8 76.6* 76.4* 77.1* 76.5* 77.1*  PLT 372 315 340 331 287 265   Cardiac Enzymes: No results for input(s): CKTOTAL, CKMB, CKMBINDEX, TROPONINI in the last 168 hours. BNP: BNP (last 3 results) No results for input(s): BNP in the last 8760 hours.  ProBNP (last 3 results) No results for input(s): PROBNP in the last 8760 hours.  CBG: No results for input(s): GLUCAP in the last 168 hours.     SignedDomenic Polite MD.  [facility] 08/19/2017, 2:37 PM

## 2017-08-19 NOTE — Telephone Encounter (Signed)
Hospitalist Dr Broadus John called that she is discharging pt today. She will need a quick follow up in the next week.

## 2017-08-19 NOTE — Care Management Note (Signed)
Case Management Note  Patient Details  Name: [name] MRN: [id number] Date of Birth: December 17, 1956  Subjective/Objective:         61 yo admitted with fever. History significant of metastatic rectal cancer            Action/Plan: From home. Choice offered for home health services. Pt states she has used [facility] previously and would like to use them again. [facility] rep alerted of referral. Pt states she has no new equipment needs at home.  Expected Discharge Date:   (unknown)               Expected Discharge Plan:  [place]  In-House Referral:     Discharge planning Services  CM Consult  Post Acute Care Choice:    Choice offered to:  Patient  DME Arranged:    DME Agency:     HH Arranged:  PT [place]:  [facility] (formerly [employer])  Status of Service:  In process, will continue to follow  If discussed at Long Length of Stay Meetings, dates discussed:    Additional CommentsLynnell Catalan, RN 08/19/2017, 2:02 PM  [phone]

## 2017-08-19 NOTE — Progress Notes (Signed)
[place] Spiritual Care Note   Referred by [facility] night/weekend chaplain Ree Edman for follow-up support, particularly because [name] is [place] patient and a [name] and is exploring her understanding and theology of death from [place] perspectives.  Although tired, [name] welcomed a [name] conversation partner and shared richly from her personal experience, theology, and worldview.  This conversation appeared to be meaningful and connecting for her.    Per pt, she does not "do well" with phone calls, so I plan to f/u by handwritten note of care and affirmation.  She is aware of ongoing Support Team availability for her and her daughter, as they may desire.   [place], [place], [facility] M-F daytime pager [phone] [facility] 24/7 pager [phone] Voicemail [phone]

## 2017-08-19 NOTE — Progress Notes (Signed)
The patient states that she is feeling better today. She think she is going home later today. We will sign off. Call us if needed. She will follow-up with Dr. Burr Medico.

## 2017-08-19 NOTE — Evaluation (Signed)
Physical Therapy Evaluation Patient Details Name: [name] MRN: [id number] DOB: 04-11-1956 Today's Date: 08/19/2017   History of Present Illness  Pt admitted with N/V and dehyration.  Pt with dx of colorectal CA with mets and on Palliative Immuno Therapy.  Pt also with with hx of polyneuropathy and TIA x2  Clinical Impression  Pt admitted as above and presenting with functional mobility limitations 2* generalized weakness, ltd endurance and balance deficits.  Pt plans dc home with family assist.    Follow Up Recommendations Home health PT    Equipment Recommendations  None recommended by PT    Recommendations for Other Services OT consult     Precautions / Restrictions Precautions Precautions: Fall Restrictions Weight Bearing Restrictions: No      Mobility  Bed Mobility Overal bed mobility: Needs Assistance Bed Mobility: Supine to Sit     Supine to sit: Min assist     General bed mobility comments: Pt rolled to R side but requiring min assist to complete transition to sitting EOB  Transfers Overall transfer level: Needs assistance Equipment used: Rolling walker (2 wheeled) Transfers: Sit to/from Stand Sit to Stand: Min guard         General transfer comment: min cues for safe transition position and use of UEs to self assist  Ambulation/Gait Ambulation/Gait assistance: Min assist;Min guard Ambulation Distance (Feet): 240 Feet Assistive device: Rolling walker (2 wheeled) Gait Pattern/deviations: Step-through pattern;Decreased step length - right;Decreased step length - left;Shuffle;Trunk flexed Gait velocity: decr Gait velocity interpretation: Below normal speed for age/gender General Gait Details: cues for posture and position from RW.  Multiple short standing rest breaks to complete task  Stairs            Wheelchair Mobility    Modified Rankin (Stroke Patients Only)       Balance Overall balance assessment: Needs  assistance Sitting-balance support: Feet supported;No upper extremity supported Sitting balance-Leahy Scale: Fair     Standing balance support: Bilateral upper extremity supported Standing balance-Leahy Scale: Poor                               Pertinent Vitals/Pain Pain Assessment: No/denies pain    Home Living Family/patient expects to be discharged to:: Private residence Living Arrangements: Children Available Help at Discharge: Family;Available 24 hours/day Type of Home: House Home Access: Stairs to enter Entrance Stairs-Rails: Psychiatric nurse of Steps: 4 Home Layout: One level Home Equipment: Wheelchair - [employer] - 2 wheels;Bedside commode;Cane - single point Additional Comments: patient's daughter lives with her, dtr works from home.     Prior Function Level of Independence: Needs assistance   Gait / Transfers Assistance Needed: walking with rw  ADL's / Homemaking Assistance Needed: dtr assisting with dressing and bathing.  Comments: pt states she was mostly bedbound for 9 months prior to [place] 03/25/17, She had gone to an inpt rehab and dragged foot when she walked     Hand Dominance        Extremity/Trunk Assessment   Upper Extremity Assessment Upper Extremity Assessment: Generalized weakness    Lower Extremity Assessment Lower Extremity Assessment: Generalized weakness       Communication   Communication: No difficulties  Cognition Arousal/Alertness: Awake/alert Behavior During Therapy: WFL for tasks assessed/performed;Flat affect Overall Cognitive Status: Within Functional Limits for tasks assessed  General Comments      Exercises     Assessment/Plan    PT Assessment Patient needs continued PT services  PT Problem List Decreased strength;Decreased activity tolerance;Decreased balance;Decreased mobility;Decreased knowledge of use of DME       PT  Treatment Interventions DME instruction;Gait training;Stair training;Functional mobility training;Therapeutic activities;Therapeutic exercise;Balance training;Patient/family education    PT Goals (Current goals can be found in the Care Plan section)  Acute Rehab PT Goals Patient Stated Goal: Be able to walk outside with my walker PT Goal Formulation: With patient Time For Goal Achievement: 08/30/17 Potential to Achieve Goals: Good    Frequency Min 3X/week   Barriers to discharge        Co-evaluation               AM-PAC PT "6 Clicks" Daily Activity  Outcome Measure Difficulty turning over in bed (including adjusting bedclothes, sheets and blankets)?: Unable Difficulty moving from lying on back to sitting on the side of the bed? : Unable Difficulty sitting down on and standing up from a chair with arms (e.g., wheelchair, bedside commode, etc,.)?: A Lot Help needed moving to and from a bed to chair (including a wheelchair)?: A Little Help needed walking in hospital room?: A Little Help needed climbing 3-5 steps with a railing? : A Lot 6 Click Score: 12    End of Session Equipment Utilized During Treatment: Gait belt Activity Tolerance: Patient tolerated treatment well;Patient limited by fatigue Patient left: in chair;with call bell/phone within reach;with chair alarm set Nurse Communication: Mobility status PT Visit Diagnosis: Unsteadiness on feet (R26.81);Difficulty in walking, not elsewhere classified (R26.2);Muscle weakness (generalized) (M62.81)    Time: 8325-4982 PT Time Calculation (min) (ACUTE ONLY): 29 min   Charges:   PT Evaluation $PT Eval Low Complexity: 1 Low PT Treatments $Gait Training: 8-22 mins   PT G Codes:        Pg [phone]   Jessicamarie Amiri 08/19/2017, 10:28 AM

## 2017-08-20 ENCOUNTER — Encounter: Payer: Self-pay | Admitting: Hematology

## 2017-08-20 ENCOUNTER — Other Ambulatory Visit: Payer: Federal, State, Local not specified - PPO

## 2017-08-20 ENCOUNTER — Ambulatory Visit: Payer: Federal, State, Local not specified - PPO

## 2017-08-20 ENCOUNTER — Ambulatory Visit (HOSPITAL_BASED_OUTPATIENT_CLINIC_OR_DEPARTMENT_OTHER): Payer: Federal, State, Local not specified - PPO

## 2017-08-20 ENCOUNTER — Ambulatory Visit (HOSPITAL_BASED_OUTPATIENT_CLINIC_OR_DEPARTMENT_OTHER): Payer: Federal, State, Local not specified - PPO | Admitting: Hematology

## 2017-08-20 ENCOUNTER — Other Ambulatory Visit (HOSPITAL_BASED_OUTPATIENT_CLINIC_OR_DEPARTMENT_OTHER): Payer: Federal, State, Local not specified - PPO

## 2017-08-20 VITALS — BP 136/69 | HR 68

## 2017-08-20 VITALS — BP 125/72 | HR 61 | Temp 98.5°F | Resp 18 | Ht 63.0 in | Wt 140.1 lb

## 2017-08-20 DIAGNOSIS — C787 Secondary malignant neoplasm of liver and intrahepatic bile duct: Secondary | ICD-10-CM | POA: Diagnosis not present

## 2017-08-20 DIAGNOSIS — Z95828 Presence of other vascular implants and grafts: Secondary | ICD-10-CM

## 2017-08-20 DIAGNOSIS — Z7189 Other specified counseling: Secondary | ICD-10-CM

## 2017-08-20 DIAGNOSIS — Z79899 Other long term (current) drug therapy: Secondary | ICD-10-CM | POA: Diagnosis not present

## 2017-08-20 DIAGNOSIS — I82409 Acute embolism and thrombosis of unspecified deep veins of unspecified lower extremity: Secondary | ICD-10-CM

## 2017-08-20 DIAGNOSIS — C210 Malignant neoplasm of anus, unspecified: Secondary | ICD-10-CM

## 2017-08-20 DIAGNOSIS — G4700 Insomnia, unspecified: Secondary | ICD-10-CM

## 2017-08-20 DIAGNOSIS — R110 Nausea: Secondary | ICD-10-CM

## 2017-08-20 DIAGNOSIS — C772 Secondary and unspecified malignant neoplasm of intra-abdominal lymph nodes: Secondary | ICD-10-CM

## 2017-08-20 DIAGNOSIS — R531 Weakness: Secondary | ICD-10-CM

## 2017-08-20 DIAGNOSIS — E860 Dehydration: Secondary | ICD-10-CM | POA: Diagnosis not present

## 2017-08-20 DIAGNOSIS — R5383 Other fatigue: Secondary | ICD-10-CM

## 2017-08-20 DIAGNOSIS — C801 Malignant (primary) neoplasm, unspecified: Secondary | ICD-10-CM

## 2017-08-20 DIAGNOSIS — R630 Anorexia: Secondary | ICD-10-CM | POA: Diagnosis not present

## 2017-08-20 DIAGNOSIS — R112 Nausea with vomiting, unspecified: Secondary | ICD-10-CM

## 2017-08-20 DIAGNOSIS — F418 Other specified anxiety disorders: Secondary | ICD-10-CM | POA: Diagnosis not present

## 2017-08-20 LAB — CBC WITH DIFFERENTIAL/PLATELET
BASO%: 0.3 % (ref 0.0–2.0)
Basophils Absolute: 0 10*3/uL (ref 0.0–0.1)
EOS ABS: 0.4 10*3/uL (ref 0.0–0.5)
EOS%: 6.1 % (ref 0.0–7.0)
HEMATOCRIT: 37.4 % (ref 34.8–46.6)
HGB: 12 g/dL (ref 11.6–15.9)
LYMPH%: 15.9 % (ref 14.0–49.7)
MCH: 26.2 pg (ref 25.1–34.0)
MCHC: 32.3 g/dL (ref 31.5–36.0)
MCV: 81.2 fL (ref 79.5–101.0)
MONO#: 0.8 10*3/uL (ref 0.1–0.9)
MONO%: 11.3 % (ref 0.0–14.0)
NEUT%: 66.4 % (ref 38.4–76.8)
NEUTROS ABS: 4.5 10*3/uL (ref 1.5–6.5)
Platelets: 300 10*3/uL (ref 145–400)
RBC: 4.6 10*6/uL (ref 3.70–5.45)
RDW: 16.7 % — ABNORMAL HIGH (ref 11.2–14.5)
WBC: 6.8 10*3/uL (ref 3.9–10.3)
lymph#: 1.1 10*3/uL (ref 0.9–3.3)

## 2017-08-20 LAB — COMPREHENSIVE METABOLIC PANEL
ALBUMIN: 3.3 g/dL — AB (ref 3.5–5.0)
ALK PHOS: 78 U/L (ref 40–150)
ALT: <6 U/L (ref 0–55)
AST: 11 U/L (ref 5–34)
Anion Gap: 5 mEq/L (ref 3–11)
BILIRUBIN TOTAL: 0.35 mg/dL (ref 0.20–1.20)
BUN: 4.9 mg/dL — AB (ref 7.0–26.0)
CALCIUM: 9 mg/dL (ref 8.4–10.4)
CO2: 28 mEq/L (ref 22–29)
CREATININE: 0.7 mg/dL (ref 0.6–1.1)
Chloride: 108 mEq/L (ref 98–109)
Glucose: 95 mg/dl (ref 70–140)
POTASSIUM: 3.4 meq/L — AB (ref 3.5–5.1)
Sodium: 140 mEq/L (ref 136–145)
Total Protein: 6.4 g/dL (ref 6.4–8.3)

## 2017-08-20 LAB — CULTURE, BLOOD (ROUTINE X 2)
CULTURE: NO GROWTH
Culture: NO GROWTH
SPECIAL REQUESTS: ADEQUATE
SPECIAL REQUESTS: ADEQUATE

## 2017-08-20 LAB — TSH: TSH: 3.475 m[IU]/L (ref 0.308–3.960)

## 2017-08-20 MED ORDER — HEPARIN SOD (PORK) LOCK FLUSH 100 UNIT/ML IV SOLN
500.0000 [IU] | Freq: Once | INTRAVENOUS | Status: AC
  Administered 2017-08-20: 500 [IU] via INTRAVENOUS
  Filled 2017-08-20: qty 5

## 2017-08-20 MED ORDER — ACETAMINOPHEN-CODEINE #3 300-30 MG PO TABS: 0.5000 | ORAL_TABLET | Freq: Every day | ORAL | 0 refills | Status: DC

## 2017-08-20 MED ORDER — SODIUM CHLORIDE 0.9 % IV SOLN
INTRAVENOUS | Status: AC
  Administered 2017-08-20: 13:00:00 via INTRAVENOUS

## 2017-08-20 MED ORDER — SODIUM CHLORIDE 0.9% FLUSH
10.0000 mL | INTRAVENOUS | Status: DC | PRN
  Administered 2017-08-20: 10 mL via INTRAVENOUS
  Filled 2017-08-20: qty 10

## 2017-08-20 MED ORDER — SODIUM CHLORIDE 0.9% FLUSH
10.0000 mL | Freq: Once | INTRAVENOUS | Status: AC
  Administered 2017-08-20: 10 mL
  Filled 2017-08-20: qty 10

## 2017-08-20 NOTE — Patient Instructions (Signed)
Dehydration, Adult Dehydration is when there is not enough fluid or water in your body. This happens when you lose more fluids than you take in. Dehydration can range from mild to very bad. It should be treated right away to keep it from getting very bad. Symptoms of mild dehydration may include:  Thirst.  Dry lips.  Slightly dry mouth.  Dry, warm skin.  Dizziness. Symptoms of moderate dehydration may include:  Very dry mouth.  Muscle cramps.  Dark pee (urine). Pee may be the color of tea.  Your body making less pee.  Your eyes making fewer tears.  Heartbeat that is uneven or faster than normal (palpitations).  Headache.  Light-headedness, especially when you stand up from sitting.  Fainting (syncope). Symptoms of very bad dehydration may include:  Changes in skin, such as: ? Cold and clammy skin. ? Blotchy (mottled) or pale skin. ? Skin that does not quickly return to normal after being lightly pinched and let go (poor skin turgor).  Changes in body fluids, such as: ? Feeling very thirsty. ? Your eyes making fewer tears. ? Not sweating when body temperature is high, such as in hot weather. ? Your body making very little pee.  Changes in vital signs, such as: ? Weak pulse. ? Pulse that is more than 100 beats a minute when you are sitting still. ? Fast breathing. ? Low blood pressure.  Other changes, such as: ? Sunken eyes. ? Cold hands and feet. ? Confusion. ? Lack of energy (lethargy). ? Trouble waking up from sleep. ? Short-term weight loss. ? Unconsciousness. Follow these instructions at home:  If told by your doctor, drink an ORS: ? Make an ORS by using instructions on the package. ? Start by drinking small amounts, about  cup (120 mL) every 5-10 minutes. ? Slowly drink more until you have had the amount that your doctor said to have.  Drink enough clear fluid to keep your pee clear or pale yellow. If you were told to drink an ORS, finish the ORS  first, then start slowly drinking clear fluids. Drink fluids such as: ? Water. Do not drink only water by itself. Doing that can make the salt (sodium) level in your body get too low (hyponatremia). ? Ice chips. ? Fruit juice that you have added water to (diluted). ? Low-calorie sports drinks.  Avoid: ? Alcohol. ? Drinks that have a lot of sugar. These include high-calorie sports drinks, fruit juice that does not have water added, and soda. ? Caffeine. ? Foods that are greasy or have a lot of fat or sugar.  Take over-the-counter and prescription medicines only as told by your doctor.  Do not take salt tablets. Doing that can make the salt level in your body get too high (hypernatremia).  Eat foods that have minerals (electrolytes). Examples include bananas, oranges, potatoes, tomatoes, and spinach.  Keep all follow-up visits as told by your doctor. This is important. Contact a doctor if:  You have belly (abdominal) pain that: ? Gets worse. ? Stays in one area (localizes).  You have a rash.  You have a stiff neck.  You get angry or annoyed more easily than normal (irritability).  You are more sleepy than normal.  You have a harder time waking up than normal.  You feel: ? Weak. ? Dizzy. ? Very thirsty.  You have peed (urinated) only a small amount of very dark pee during 6-8 hours. Get help right away if:  You have symptoms of   very bad dehydration.  You cannot drink fluids without throwing up (vomiting).  Your symptoms get worse with treatment.  You have a fever.  You have a very bad headache.  You are throwing up or having watery poop (diarrhea) and it: ? Gets worse. ? Does not go away.  You have blood or something green (bile) in your throw-up.  You have blood in your poop (stool). This may cause poop to look black and tarry.  You have not peed in 6-8 hours.  You pass out (faint).  Your heart rate when you are sitting still is more than 100 beats a  minute.  You have trouble breathing. This information is not intended to replace advice given to you by your health care provider. Make sure you discuss any questions you have with your health care provider. Document Released: 10/05/2009 Document Revised: 06/28/2016 Document Reviewed: 02/02/2016 [employer] Interactive Patient Education  2018 [employer]  

## 2017-08-20 NOTE — Progress Notes (Signed)
[place]  [phone] Fax:([phone]) [phone]  Clinic Follow up Note   Patient Care Team: Damaris Hippo, MD as PCP - General (Family Medicine) 08/20/2017   [facility] discharge f/u   SUMMARY OF ONCOLOGIC HISTORY: Oncology History   [id number] stage IIIB      Anal cancer ([facility])   03/09/2015 Initial Diagnosis    Rectal cancer ([facility])      04/19/2015 Imaging    PET scan showed hypermetabolic rectal lesion and inguinal lymph nodes      05/08/2015 - 07/13/2015 Radiation Therapy    59 Gy in 35 fractions to pelvis and inguinal LNs for her anal cancer. She tolerated treatment poorly with persistent nausea, vomiting, diarrhea and a poor appetite throughout treatment, and required several hospitalization and treatment delays. She did complete the planned treatment.       05/08/2015 - 06/2015 Chemotherapy    Concurrent chemotherapy with 5-FU and mitomycin along with radiation. Pt tolerated poorly.       01/08/2016 Imaging    PET scan showed you hypermetabolic left mediastinal 1L adenopathy (1.5cm), partial response to therapy with interval decrease in extent of hypermetabolic rectal soft tissue fullness as well as resolution of inguinal adenopathy.      02/06/2017 Procedure    Colonoscopy showed scar tissue just off anal verge, no lesion otherwise.      04/16/2017 - 04/23/2017 Hospital Admission    Admitted 04/16/17 - 04/23/17 for Nausea and Vomiting and abdominal pain. CT abd noted to have findings including rectal mass and enlarged nodes. Patient underwent sigmoidoscopy with biopsy showing benign findings. IR was consulted for node biopsy and which was done and awaiting results.      04/17/2017 Imaging    CT AP W Contrast 04/17/17: IMPRESSION: 1. Left posterior perirectal lobulated mass concerning for rectal malignancy. A smaller nodular density superior to this mass likely represent a mildly enlarged metastatic node. MRI is recommended for further  evaluation. 2. 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 A hypodense lesion to the left of the L5 posterior to the psoas muscle is also consistent with metastatic disease. 3. Multiple hepatic hypodense lesions, incompletely characterized, possibly metastatic. MRI is recommended for further evaluation. 4. No evidence of bowel obstruction or active inflammation. Normal appendix.      04/17/2017 Pathology Results    Diagnosis Rectum, biopsy, distal - BENIGN COLONIC MUCOSA WITH EROSIONS AND ULCERATION, SEE COMMENT. - NO DYSPLASIA OR MALIGNANCY.       04/22/2017 Pathology Results    Diagnosis Lymph node, needle/core biopsy, left retroperitoneum periaortic - POORLY DIFFERENTIATED SQUAMOUS CELL CARCINOMA - SEE COMMENT Microscopic Comment The needle core biopsy has a basaloid neoplasm with increased mitoses and necrosis. There is no residual lymph node parenchyma identified. The neoplasm is immunoreactive for cytokeratin 7 (patchy), cytokeratin 5/6, and p16 but negative for CDX2 and cytokeratin 20. Overall, the histology and immunophenotype support the diagnosis of a poorly differentiated squamous cell carcinoma.       05/13/2017 PET scan    IMPRESSION: 1. Perirectal adenopathy and posterior perirectal hypermetabolic mass, associated with presumed metastatic adenopathy in knee left common iliac, retroperitoneal, thoracic paraesophageal, and left upper mediastinal regions. The left upper mediastinal mass extends into the left supraclavicular space. 2. Metastatic disease to the liver (best seen in segment 3) along with a hypermetabolic nodule compatible with metastatic disease in the superior segment of the lower lobe of the left lung.      05/16/2017 -  Antibody Plan     Nivolumab 240mg  every 2 weeks started on 05/16/17       08/01/2017 PET scan    PET 08/01/17 IMPRESSION: 1. Mixed appearance of response, with significant  improvement in the thoracic metastatic disease ; mild improvement in the abdominal adenopathy ; new and worsening metastatic disease in the liver; and a relatively stable left posterior perirectal mass. 2. Other imaging findings of potential clinical significance: Aortic Atherosclerosis (ICD10-I70.0). Prominent stool throughout the colon favors constipation.       08/13/2017 Imaging    CT Abdomen/Pelvis w contrast IMPRESSION: Stable rectal wall thickening and posterior rectal wall mass compatible with patient's history of rectal cancer.  Stable hepatic metastases and bulky retroperitoneal metastases.  No evidence of bowel obstruction.      08/14/2017 -  Hospital Admission    presented to the ED yesterday with constipation, nausea, abdominal discomfort and vomiting episodes      History of Present Illness (04/17/17) [name] is a 61 y.o. 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 I was called to see her in hospital.   She is from [place], with past medical history of DVT on Xarelto, rectal cancer status post chemotherapy and radiation in 2016, stroke, asthma, who was admitted to hospital last night for nausea, vomiting and abdominal pain. CT of abdomen and pelvis showed left posterior perirectal lobulated mass concerning for rectum malignancy. Retroperitoneal adenopathy, I hypodense lesion to the left L5 to the sore S muscle, suspicious for metastasis, and multiple hepatic hypodense lesions, indeterminate. I was called to see the patient to ruled out metastatic rectal cancer recurrence. I saw the patient before her endoscopy today.  The patient reports she has a prior history of rectal cancer stage 3, squamous cell, which was treated with chemotherapy and radiation therapy. She did not have surgery. The patient reports she completed treatment in September 2016. The patient has not used her port since 2016, though  it is still in place. She reports the last time she followed up with physicians for her prior cancer, they told her she had suspicious masses in her lungs and liver.  The patient does not have any medical records from [place], where she previously lived and was treated, but reports her daughter would have them. She reports she does not know if she plans to stay in [place], or if she will return to [place].    Current Treatment:  Nivolumab 240mg  every 2 weeks started on 05/16/17   INTERVAL HISTORY:[name] is here for a follow up. She presents to the clinic today with her daughter. She was admitted to hospital last week for fever, nausea, vomiting and constipation. She was discharged home yesterday. Her nausea has improved, but appetite is still low, has had normal BM lately, still very weak and fatigued.   REVIEW OF SYSTEMS:   Constitutional: Denies abnormal weight loss (+)still overall fatigued Eyes: Denies blurriness of vision Ears, nose, mouth, throat, and face: Denies mucositis or sore throat  Respiratory: Denies cough, dyspnea or wheezes Cardiovascular: Denies palpitation, chest discomfort or lower extremity swelling Gastrointestinal:  Denies heartburn  (+) nausea (+) diarrhea (+)vomiting, with occasional phlegm cough up Skin: Denies abnormal skin rashes Lymphatics: Denies new lymphadenopathy or easy bruising Neurological:Denies numbness, tingling or new weaknesses MSK: (+) occasional  hip pain from surgery Behavioral/Psych: Mood is stable, no new changes  All other systems were reviewed with the patient and are negative.  MEDICAL HISTORY:  Past Medical History:  Diagnosis Date  . Anxiety   . Arthritis   . Asthma    childhood   . Cancer [facility])    colorectal cancer with chemo and radiation   . DVT (deep venous thrombosis) ([facility])    left lower extremity  . Dyspnea    with anxiety and exertion  . GERD (gastroesophageal reflux disease)   . Neuromuscular  disorder ([facility])    poly neuropathy   . Stroke [facility]) 2010, 2011   TIA  x2    SURGICAL HISTORY: Past Surgical History:  Procedure Laterality Date  . CESAREAN SECTION    . FLEXIBLE SIGMOIDOSCOPY N/A 04/17/2017   Procedure: FLEXIBLE SIGMOIDOSCOPY;  Surgeon: Ronald Lobo, MD;  Location: [facility] ENDOSCOPY;  Service: Endoscopy;  Laterality: N/A;  . IR FLUORO GUIDE PORT INSERTION LEFT  06/04/2017  . IR REMOVAL TUN ACCESS W/ PORT W/O FL MOD SED  06/04/2017  . IR US GUIDE VASC ACCESS LEFT  06/04/2017  . IR VENO/EXT/UNI LEFT  06/04/2017  . THYROID CYST EXCISION    . TOTAL HIP ARTHROPLASTY Left 03/25/2017   Procedure: LEFT TOTAL HIP ARTHROPLASTY ANTERIOR APPROACH;  Surgeon: Paralee Cancel, MD;  Location: [facility] ORS;  Service: Orthopedics;  Laterality: Left;  Requests 70 mins    I have reviewed the social history and family history with the patient and they are unchanged from previous note.  ALLERGIES:  is allergic to ativan [lorazepam] and penicillins.  MEDICATIONS:  Current Outpatient Prescriptions  Medication Sig Dispense Refill  . acetaminophen-codeine (TYLENOL #3) 300-30 MG tablet Take 0.5 tablets by mouth daily. 30 tablet 0  . dronabinol (MARINOL) 5 MG capsule Take 1 capsule (5 mg total) by mouth 2 (two) times daily before a meal. 60 capsule 0  . lidocaine-prilocaine (EMLA) cream Apply 1 application topically as needed. (Patient taking differently: Apply 1 application topically as needed (for port). ) 30 g 6  . methocarbamol (ROBAXIN) 500 MG tablet Take 1 tablet (500 mg total) by mouth every 6 (six) hours as needed for muscle spasms. 40 tablet 0  . metoCLOPramide (REGLAN) 10 MG tablet Take 1 tablet (10 mg total) by mouth 4 (four) times daily -  before meals and at bedtime. (Patient taking differently: Take 10 mg by mouth 2 (two) times daily. ) 120 tablet 0  . Multiple Vitamins-Minerals (CENTRUM ADULTS) TABS Take 1 tablet by mouth daily.    . rivaroxaban (XARELTO) 20 MG TABS tablet Take 1 tablet (20 mg  total) by mouth daily with supper. Please follow up with PCP to continue this medication. 30 tablet 0  . saccharomyces boulardii (FLORASTOR) 250 MG capsule Take 250 mg by mouth daily.    . traMADol (ULTRAM) 50 MG tablet Take 1 tablet (50 mg total) by mouth every 6 (six) hours as needed. (Patient taking differently: Take 50 mg by mouth 2 (two) times daily. Takes at 0700 and 1300) 30 tablet 0  . docusate sodium (COLACE) 100 MG capsule Take 1 capsule (100 mg total) by mouth 2 (two) times daily. (Patient not taking: Reported on 08/20/2017) 10 capsule 0  . LACTULOSE PO Take 1 Package by mouth daily as needed (constipation).     Marland Kitchen loperamide (IMODIUM A-D) 2 MG tablet Take 1 tablet (2 mg total) by mouth 4 (four) times daily as needed for diarrhea or loose stools. (Patient not taking: Reported on 08/20/2017) 30 tablet 0  . magnesium citrate SOLN Take 1 Bottle by mouth once.    Marland Kitchen omeprazole (  PRILOSEC OTC) 20 MG tablet Take 20 mg by mouth daily as needed (for acid reflex).    . ondansetron (ZOFRAN ODT) 4 MG disintegrating tablet Take 1 tablet (4 mg total) by mouth every 8 (eight) hours as needed for nausea or vomiting. (Patient not taking: Reported on 08/14/2017) 12 tablet 1  . polyethylene glycol (MIRALAX / GLYCOLAX) packet Take 17 g by mouth 2 (two) times daily. (Patient not taking: Reported on 08/20/2017) 14 each 0  . promethazine (PHENERGAN) 25 MG tablet Take 1 tablet (25 mg total) by mouth every 6 (six) hours as needed for nausea or vomiting. (Patient not taking: Reported on 08/20/2017) 12 tablet 1   No current facility-administered medications for this visit.     PHYSICAL EXAMINATION:  ECOG PERFORMANCE STATUS: 3 BP 125/72 (BP Location: Left Arm, Patient Position: Sitting)   Pulse 61   Temp 98.5 F (36.9 C) (Oral)   Resp 18   Ht 5\' 3"  (1.6 m)   Wt 140 lb 1.6 oz (63.5 kg)   SpO2 100%   BMI 24.82 kg/m   GENERAL:alert, not in distress, came in wheelchair  SKIN: skin color, texture, turgor are normal,  no rashes or significant lesions  EYES: normal, Conjunctiva are pink and non-injected, sclera clear OROPHARYNX:no exudate, no erythema and lips, buccal mucosa, and tongue normal  NECK: supple, thyroid normal size, non-tender, without nodularity LYMPH:  no palpable lymphadenopathy in the cervical, axillary or inguinal LUNGS: clear to auscultation and percussion with normal breathing effort HEART: regular rate & rhythm and no murmurs and no lower extremity edema ABDOMEN:abdomen soft, mild diffuse tenderness, normal bowel sounds Musculoskeletal:no cyanosis of digits and no clubbing  NEURO: alert & oriented x 3 with fluent speech, no focal motor/sensory deficits  LABORATORY DATA:  I have reviewed the data as listed CBC Latest Ref Rng & Units 08/20/2017 08/18/2017 08/17/2017  WBC 3.9 - 10.3 10e3/uL 6.8 7.0 8.8  Hemoglobin 11.6 - 15.9 g/dL 12.0 12.1 13.5  Hematocrit 34.8 - 46.6 % 37.4 35.6(L) 38.8  Platelets 145 - 400 10e3/uL 300 265 287     CMP Latest Ref Rng & Units 08/20/2017 08/18/2017 08/17/2017  Glucose 70 - 140 mg/dl 95 91 90  BUN 7.0 - 26.0 mg/dL 4.9(L) 7 6  Creatinine 0.6 - 1.1 mg/dL 0.7 0.58 0.55  Sodium 136 - 145 mEq/L 140 140 138  Potassium 3.5 - 5.1 mEq/L 3.4(L) 3.7 2.8(L)  Chloride 101 - 111 mmol/L - 110 105  CO2 22 - 29 mEq/L 28 26 26   Calcium 8.4 - 10.4 mg/dL 9.0 8.5(L) 8.4(L)  Total Protein 6.4 - 8.3 g/dL 6.4 5.7(L) 6.4(L)  Total Bilirubin 0.20 - 1.20 mg/dL 0.35 0.8 1.4(H)  Alkaline Phos 40 - 150 U/L 78 65 71  AST 5 - 34 U/L 11 11(L) 14(L)  ALT 0-55 U/L U/L <6 8(L) 9(L)   PATHOLOGY:   Pathology Results: 04/22/17 Diagnosis Lymph node, needle/core biopsy, left retroperitoneum periaortic - POORLY DIFFERENTIATED SQUAMOUS CELL CARCINOMA - SEE COMMENT Microscopic Comment The needle core biopsy has a basaloid neoplasm with increased mitoses and necrosis. There is no residual lymph node parenchyma identified. The neoplasm is immunoreactive for cytokeratin 7 (patchy),  cytokeratin 5/6, and p16 but negative for CDX2 and cytokeratin 20. Overall, the histology and immunophenotype support the diagnosis of a poorly differentiated squamous cell carcinoma.  Surgical Pathology 04/17/2017 Diagnosis Rectum, biopsy, distal - BENIGN COLONIC MUCOSA WITH EROSIONS AND ULCERATION, SEE COMMENT. - NO DYSPLASIA OR MALIGNANCY. Microscopic Comment There is lamina propria  fibrosis and macrophages consistent with prior treatment. The case was called to Dr. Cristina Gong on 04/18/2017.  RADIOGRAPHIC STUDIES: I have personally reviewed the radiological images as listed and agreed with the findings in the report. No results found.   PET 08/01/17 IMPRESSION: 1. Mixed appearance of response, with significant improvement in the thoracic metastatic disease ; mild improvement in the abdominal adenopathy ; new and worsening metastatic disease in the liver; and a relatively stable left posterior perirectal mass. 2. Other imaging findings of potential clinical significance: Aortic Atherosclerosis (ICD10-I70.0). Prominent stool throughout the colon favors constipation.   PET Scan 05/13/2017 IMPRESSION: 1. Perirectal adenopathy and posterior perirectal hypermetabolic mass, associated with presumed metastatic adenopathy in knee left common iliac, retroperitoneal, thoracic paraesophageal, and left upper mediastinal regions. The left upper mediastinal mass extends into the left supraclavicular space. 2. Metastatic disease to the liver (best seen in segment 3) along with a hypermetabolic nodule compatible with metastatic disease in the superior segment of the lower lobe of the left lung.   CT AP W Contrast 04/17/17: IMPRESSION: 1. Left posterior perirectal lobulated mass concerning for rectal malignancy. A smaller nodular density superior to this mass likely represent a mildly enlarged metastatic node. MRI is recommended for further evaluation. 2. Retroperitoneal adenopathy most  consistent with metastatic disease, likely from rectal neoplasm. A hypodense lesion to the left of the L5 posterior to the psoas muscle is also consistent with metastatic disease. 3. Multiple hepatic hypodense lesions, incompletely characterized, possibly metastatic. MRI is recommended for further evaluation. 4. No evidence of bowel obstruction or active inflammation. Normal appendix.  ASSESSMENT & PLAN:  [name] is a 61 y.o. female from [place], with past medical history of DVT on Xarelto, rectal squamous cell carcinoma status post chemotherapy and radiation in 2016, stroke, asthma, who was admitted to hospital recently for nausea, vomiting and abdominal pain. She has had those symptoms since her recent hip surgery on 04/21/2017.   1.Nausea, anorexia and BM change  -She was recently admitted for N/V, anorexia and fever, ID work up negative, symptoms improved after su[pportive care -I recommend her to increase marinol to 10mg  bid -I suggest her to try megace for anorexia, she will think about it -IVF today   2. 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 -We reviewed her recent CT scan findings, and her retroperitoneal lymph node biopsy results in details with patient and her daughter. -Unfortunately, biopsy confirmed metastatic disease to abdominal lymph nodes. Her CT scan is also suspicious for liver and pelvic metastasis. She was not able to tolerate MRI to evaluate her liver lesions. -We discussed the incurable nature of her disease at this stage, and the goal of therapy is palliative. -I previously discussed her staging PET scan results, which showed metastasis in liver, left lung and diffuse nodal metastasis in chest and  abdomen.  -She has started first-line therapy with Nivolumab, tolerating well. -She has successfully weaned off hydrocodone. She is on tylenol #3 twice daily, I encouraged her to continue weaning, she can use tramadol as needed. - I encouraged her to take Colace and do Miralax 4-5 days a week to help with constipation -PET scan from 08/01/17 reviewed. Mixed response, her low cervical and thoracic lymph nodes have significantly decreased. However her liver metastasis have increased in size and  metabolism. Her abdominal adenopathy has been stable. Overall she has a mixed response to immunotherapy.  -We discussed the liver metastasis change could be true disease progression, versus pseudo-progression secondary to immune response to therapy. -She has been doing well overall symptoms have much improved, but is she may still not be strong enough to do intensive chemotherapy. -After a lengthy discussion on 08/06/2017 , we decided to continue nivolumab with close follow-up. If she shows signs and symptoms of cancer progression we will move to second line chemo sooner. She agrees. -due to her recent hospital admission, will hold Nivo today, restart in 2 weeks  -we discussed that her recent symptoms could also be related to her cancer, and we can consider switching her treatment to chemo, pt is reluctant due to her previous poor tolerance to chemo  -I encourage her continue PT     3. Depression/anxiety and insomnia  -I suggest Mirtazapine, but she wishes to wait until after she is weaned off hydrocodone, she has been having a lot of anxiety during the weaning process of narcotics. -In the meantime I suggest sleep aid like melatonin over-the-counter  - I referred her to social worker  4. Left hip pain after hip replacement  -She underwent a total left hip replacement by Dr. Alvan Dame on 03/25/2017 -She has weaned off hydrocodone, on Tylenol No. 3 twice daily now -she will continue physical therapy as outpatient,  she is overall improving. -I previously suggested the pt reduce Tylenol #3 by half tablets to  replace with tramadol, starting with the morning dose.   5. Goal of care discussion  -We again discussed the incurable nature of her cancer, and the overall poor prognosis, especially if she does not have good response to chemotherapy or progress on chemo -The patient understands the goal of care is palliative. -I previously recommend DNR/DNI, she will think about it    PLAN:  -NS 1L today  -lab, flush, f/u and Nivo in 2 weeks -she will increase marinol dose to 10mg  bid, she will think about megace   No orders of the defined types were placed in this encounter.  All questions were answered. The patient knows to call the clinic with any problems, questions or concerns. No barriers to learning was detected.  I spent 25 minutes counseling the patient face to face. The total time spent in the appointment was 30 minutes and more than 50% was on counseling and review of test results  This document serves as a record of services personally performed by Truitt Merle, MD. It was created on her behalf by Joslyn Devon, a trained medical scribe. The creation of this record is based on the scribe's personal observations and the provider's statements to them. This document has been checked and approved by the attending provider.     Truitt Merle  08/20/2017

## 2017-08-20 NOTE — Patient Instructions (Signed)
Implanted Port Home Guide An implanted port is a type of central line that is placed under the skin. Central lines are used to provide IV access when treatment or nutrition needs to be given through a person's veins. Implanted ports are used for long-term IV access. An implanted port may be placed because:  You need IV medicine that would be irritating to the small veins in your hands or arms.  You need long-term IV medicines, such as antibiotics.  You need IV nutrition for a long period.  You need frequent blood draws for lab tests.  You need dialysis.  Implanted ports are usually placed in the chest area, but they can also be placed in the upper arm, the abdomen, or the leg. An implanted port has two main parts:  Reservoir. The reservoir is round and will appear as a small, raised area under your skin. The reservoir is the part where a needle is inserted to give medicines or draw blood.  Catheter. The catheter is a thin, flexible tube that extends from the reservoir. The catheter is placed into a large vein. Medicine that is inserted into the reservoir goes into the catheter and then into the vein.  How will I care for my incision site? Do not get the incision site wet. Bathe or shower as directed by your health care provider. How is my port accessed? Special steps must be taken to access the port:  Before the port is accessed, a numbing cream can be placed on the skin. This helps numb the skin over the port site.  Your health care provider uses a sterile technique to access the port. ? Your health care provider must put on a mask and sterile gloves. ? The skin over your port is cleaned carefully with an antiseptic and allowed to dry. ? The port is gently pinched between sterile gloves, and a needle is inserted into the port.  Only "non-coring" port needles should be used to access the port. Once the port is accessed, a blood return should be checked. This helps ensure that the port  is in the vein and is not clogged.  If your port needs to remain accessed for a constant infusion, a clear (transparent) bandage will be placed over the needle site. The bandage and needle will need to be changed every week, or as directed by your health care provider.  Keep the bandage covering the needle clean and dry. Do not get it wet. Follow your health care provider's instructions on how to take a shower or bath while the port is accessed.  If your port does not need to stay accessed, no bandage is needed over the port.  What is flushing? Flushing helps keep the port from getting clogged. Follow your health care provider's instructions on how and when to flush the port. Ports are usually flushed with saline solution or a medicine called heparin. The need for flushing will depend on how the port is used.  If the port is used for intermittent medicines or blood draws, the port will need to be flushed: ? After medicines have been given. ? After blood has been drawn. ? As part of routine maintenance.  If a constant infusion is running, the port may not need to be flushed.  How long will my port stay implanted? The port can stay in for as long as your health care provider thinks it is needed. When it is time for the port to come out, surgery will be   done to remove it. The procedure is similar to the one performed when the port was put in. When should I seek immediate medical care? When you have an implanted port, you should seek immediate medical care if:  You notice a bad smell coming from the incision site.  You have swelling, redness, or drainage at the incision site.  You have more swelling or pain at the port site or the surrounding area.  You have a fever that is not controlled with medicine.  This information is not intended to replace advice given to you by your health care provider. Make sure you discuss any questions you have with your health care provider. Document  Released: 12/09/2005 Document Revised: 05/16/2016 Document Reviewed: 08/16/2013 Elsevier Interactive Patient Education  2017 [employer]  

## 2017-08-21 ENCOUNTER — Telehealth: Payer: Self-pay | Admitting: Hematology

## 2017-08-21 ENCOUNTER — Encounter: Payer: Self-pay | Admitting: Hematology

## 2017-08-21 ENCOUNTER — Encounter: Payer: Self-pay | Admitting: General Practice

## 2017-08-21 NOTE — Progress Notes (Signed)
[name] Spiritual Care Note  Received thank-you call from [name] for the care package (prayer shawl, bone pillow, poem related to our inpt conversation, and handwritten note) I left for her to receive at yesterday's appt with Dr Burr Medico.  [name] appreciated the thoughtfulness, as well as the symbolism and practicality of the shawl in particular.  She plans to f/u by phone, but please also page if circumstances change or immediate needs arise. Thank you.  [place], [place], [facility] Pager [phone] Voicemail [phone]

## 2017-08-21 NOTE — Telephone Encounter (Signed)
Per 8/29 los completed

## 2017-08-27 ENCOUNTER — Telehealth: Payer: Self-pay

## 2017-08-27 NOTE — Telephone Encounter (Signed)
[name] called that pt is having trouble breathing. Her chest has been feeling heavy. She uses an inhaler that makes her heart faster. She does not use it every day and only 1-2 times per day when she does use it. Pt has had heavy chest that is increasing to every other day over the last couple of weeks. No cough. No fever. No pain with it.  No arm swelling, no neck or face swelling. More tired. [name] does see that pt is breathing heavier than normal.  Last nivolumab 8/15 Next OV on 9/12.   S/w Dr Burr Medico and called pt back. Pt states she does not feel she needs to come in at present. She is thinking this may be her new normal. She will go to ER if it gets worse in the night. She will call [place] if she feels she needs to be seen sooner than 9/12.

## 2017-08-28 ENCOUNTER — Encounter: Payer: Self-pay | Admitting: General Practice

## 2017-08-28 NOTE — Progress Notes (Signed)
[place] Spiritual Care Note  LVM of care and encouragement to call anytime.   [place], [place], [facility] Pager [phone] Voicemail [phone]

## 2017-09-01 NOTE — Progress Notes (Signed)
[name]  [phone] [phone]  Clinic Follow up Note   Patient Care Team: Lynn Hippo, MD as PCP - General (Family Medicine) 09/03/2017   CHIEF COMPLAINS f/u metastatic anal cancer  SUMMARY OF ONCOLOGIC HISTORY: Oncology History   [id number] stage IIIB      Anal cancer ([place])   03/09/2015 Initial Diagnosis    Rectal cancer ([facility] [facility])      04/19/2015 Imaging    PET scan showed hypermetabolic rectal lesion and inguinal lymph nodes      05/08/2015 - 07/13/2015 Radiation Therapy    59 Gy in 35 fractions to pelvis and inguinal LNs for her anal cancer. She tolerated treatment poorly with persistent nausea, vomiting, diarrhea and a poor appetite throughout treatment, and required several hospitalization and treatment delays. She did complete the planned treatment.       05/08/2015 - 06/2015 Chemotherapy    Concurrent chemotherapy with 5-FU and mitomycin along with radiation. Pt tolerated poorly.       01/08/2016 Imaging    PET scan showed you hypermetabolic left mediastinal 1L adenopathy (1.5cm), partial response to therapy with interval decrease in extent of hypermetabolic rectal soft tissue fullness as well as resolution of inguinal adenopathy.      02/06/2017 Procedure    Colonoscopy showed scar tissue just off anal verge, no lesion otherwise.      04/16/2017 - 04/23/2017 Hospital Admission    Admitted 04/16/17 - 04/23/17 for Nausea and Vomiting and abdominal pain. CT abd noted to have findings including rectal mass and enlarged nodes. Patient underwent sigmoidoscopy with biopsy showing benign findings. IR was consulted for node biopsy and which was done and awaiting results.      04/17/2017 Imaging    CT AP W Contrast 04/17/17: IMPRESSION: 1. Left posterior perirectal lobulated mass concerning for rectal malignancy. A smaller nodular density superior to this mass likely represent a mildly enlarged metastatic node. MRI is recommended for further  evaluation. 2. 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 A hypodense lesion to the left of the L5 posterior to the psoas muscle is also consistent with metastatic disease. 3. Multiple hepatic hypodense lesions, incompletely characterized, possibly metastatic. MRI is recommended for further evaluation. 4. No evidence of bowel obstruction or active inflammation. Normal appendix.      04/17/2017 Pathology Results    Diagnosis Rectum, biopsy, distal - BENIGN COLONIC MUCOSA WITH EROSIONS AND ULCERATION, SEE COMMENT. - NO DYSPLASIA OR MALIGNANCY.       04/22/2017 Pathology Results    Diagnosis Lymph node, needle/core biopsy, left retroperitoneum periaortic - POORLY DIFFERENTIATED SQUAMOUS CELL CARCINOMA - SEE COMMENT Microscopic Comment The needle core biopsy has a basaloid neoplasm with increased mitoses and necrosis. There is no residual lymph node parenchyma identified. The neoplasm is immunoreactive for cytokeratin 7 (patchy), cytokeratin 5/6, and p16 but negative for CDX2 and cytokeratin 20. Overall, the histology and immunophenotype support the diagnosis of a poorly differentiated squamous cell carcinoma.       05/13/2017 PET scan    IMPRESSION: 1. Perirectal adenopathy and posterior perirectal hypermetabolic mass, associated with presumed metastatic adenopathy in knee left common iliac, retroperitoneal, thoracic paraesophageal, and left upper mediastinal regions. The left upper mediastinal mass extends into the left supraclavicular space. 2. Metastatic disease to the liver (best seen in segment 3) along with a hypermetabolic nodule compatible with metastatic disease in the superior segment of the lower lobe of the left lung.      05/16/2017 -  Antibody Plan     Nivolumab 240mg  every 2 weeks started on 05/16/17       08/01/2017 PET scan    PET 08/01/17 IMPRESSION: 1. Mixed appearance of response, with significant  improvement in the thoracic metastatic disease ; mild improvement in the abdominal adenopathy ; new and worsening metastatic disease in the liver; and a relatively stable left posterior perirectal mass. 2. Other imaging findings of potential clinical significance: Aortic Atherosclerosis (ICD10-I70.0). Prominent stool throughout the colon favors constipation.       08/13/2017 Imaging    CT Abdomen/Pelvis w contrast IMPRESSION: Stable rectal wall thickening and posterior rectal wall mass compatible with patient's history of rectal cancer.  Stable hepatic metastases and bulky retroperitoneal metastases.  No evidence of bowel obstruction.      08/14/2017 -  Hospital Admission    presented to the ED yesterday with constipation, nausea, abdominal discomfort and vomiting episodes      History of Present Illness (04/17/17) [name] is a 61 y.o. 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 I was called to see her in hospital.   She is from [place], with past medical history of DVT on Xarelto, rectal cancer status post chemotherapy and radiation in 2016, stroke, asthma, who was admitted to hospital last night for nausea, vomiting and abdominal pain. CT of abdomen and pelvis showed left posterior perirectal lobulated mass concerning for rectum malignancy. Retroperitoneal adenopathy, I hypodense lesion to the left L5 to the sore S muscle, suspicious for metastasis, and multiple hepatic hypodense lesions, indeterminate. I was called to see the patient to ruled out metastatic rectal cancer recurrence. I saw the patient before her endoscopy today.  The patient reports she has a prior history of rectal cancer stage 3, squamous cell, which was treated with chemotherapy and radiation therapy. She did not have surgery. The patient reports she completed treatment in September 2016. The patient has not used her port since 2016, though  it is still in place. She reports the last time she followed up with physicians for her prior cancer, they told her she had suspicious masses in her lungs and liver.  The patient does not have any medical records from [place], where she previously lived and was treated, but reports her daughter would have them. She reports she does not know if she plans to stay in [place], or if she will return to [place].    Current Treatment:  Nivolumab 240mg  every 2 weeks started on 05/16/17, held on 08/20/17 due to poor toleration. Restart nivolamab 09/03/17   INTERVAL HISTORY:  [name] is here for a follow up. She presents to the clinic today with her daughter.  She reports to having a cold and difficulty breathing. The breathing can get better and she thinks it has to do with the spots in her lung. She has difficulty speaking in a loud voice. It is not solely tied with her anxiety.  She will wait it out. Her daughter reports to having whispered voice or struggling voice.  She sleeps on her side but can lay down flat. She reports to not having many BM that is daily. She uses colase and miralax. Her daughter reports to eating much. She is only taking vitamin supplement and will drink half of a smoothie.  Upon sitting for a long time she had pain in her rectal area.    REVIEW OF SYSTEMS:   Constitutional: Denies abnormal weight loss (+)  still overall fatigued (+) low appetite  Eyes: Denies blurriness of vision Ears, nose, mouth, throat, and face: Denies mucositis or sore throat  (+) voice change Respiratory: (+) SOB (+) dry cough  Cardiovascular: Denies palpitation, chest discomfort or lower extremity swelling Gastrointestinal:  Denies heartburn  (+) nausea (+) diarrhea (+) Low amounts of BM daily Skin: Denies abnormal skin rashes Lymphatics: Denies new lymphadenopathy or easy bruising Neurological:Denies numbness, tingling or new weaknesses MSK: (+) occasional  hip pain from surgery  (+) pain in rectal area when sitting for extended periods of time Behavioral/Psych: Mood is stable, no new changes  All other systems were reviewed with the patient and are negative.  MEDICAL HISTORY:  Past Medical History:  Diagnosis Date  . Anxiety   . Arthritis   . Asthma    childhood   . Cancer [facility]    colorectal cancer with chemo and radiation   . DVT (deep venous thrombosis) ([facility])    left lower extremity  . Dyspnea    with anxiety and exertion  . GERD (gastroesophageal reflux disease)   . Neuromuscular disorder ([facility])    poly neuropathy   . Stroke [facility] 2010, 2011   TIA  x2    SURGICAL HISTORY: Past Surgical History:  Procedure Laterality Date  . CESAREAN SECTION    . FLEXIBLE SIGMOIDOSCOPY N/A 04/17/2017   Procedure: FLEXIBLE SIGMOIDOSCOPY;  Surgeon: Ronald Lobo, MD;  Location: [facility] ENDOSCOPY;  Service: Endoscopy;  Laterality: N/A;  . IR FLUORO GUIDE PORT INSERTION LEFT  06/04/2017  . IR REMOVAL TUN ACCESS W/ PORT W/O FL MOD SED  06/04/2017  . IR US GUIDE VASC ACCESS LEFT  06/04/2017  . IR VENO/EXT/UNI LEFT  06/04/2017  . THYROID CYST EXCISION    . TOTAL HIP ARTHROPLASTY Left 03/25/2017   Procedure: LEFT TOTAL HIP ARTHROPLASTY ANTERIOR APPROACH;  Surgeon: Paralee Cancel, MD;  Location: [facility] ORS;  Service: Orthopedics;  Laterality: Left;  Requests 70 mins    I have reviewed the social history and family history with the patient and they are unchanged from previous note.  ALLERGIES:  is allergic to ativan [lorazepam] and penicillins.  MEDICATIONS:  Current Outpatient Prescriptions  Medication Sig Dispense Refill  . acetaminophen-codeine (TYLENOL #3) 300-30 MG tablet Take 0.5 tablets by mouth daily. 30 tablet 0  . docusate sodium (COLACE) 100 MG capsule Take 1 capsule (100 mg total) by mouth 2 (two) times daily. 10 capsule 0  . dronabinol (MARINOL) 5 MG capsule Take 1 capsule (5 mg total) by mouth 2 (two) times daily before a meal. 60 capsule 0  . LACTULOSE PO Take 1  Package by mouth daily as needed (constipation).     Marland Kitchen lidocaine-prilocaine (EMLA) cream Apply 1 application topically as needed. (Patient taking differently: Apply 1 application topically as needed (for port). ) 30 g 6  . loperamide (IMODIUM A-D) 2 MG tablet Take 1 tablet (2 mg total) by mouth 4 (four) times daily as needed for diarrhea or loose stools. 30 tablet 0  . magnesium citrate SOLN Take 1 Bottle by mouth once.    . methocarbamol (ROBAXIN) 500 MG tablet Take 1 tablet (500 mg total) by mouth every 6 (six) hours as needed for muscle spasms. 40 tablet 0  . metoCLOPramide (REGLAN) 10 MG tablet Take 1 tablet (10 mg total) by mouth 4 (four) times daily -  before meals and at bedtime. 120 tablet 2  . Multiple Vitamins-Minerals (CENTRUM ADULTS) TABS Take 1 tablet by mouth daily.    Marland Kitchen  omeprazole (PRILOSEC OTC) 20 MG tablet Take 20 mg by mouth daily as needed (for acid reflex).    . ondansetron (ZOFRAN ODT) 4 MG disintegrating tablet Take 1 tablet (4 mg total) by mouth every 8 (eight) hours as needed for nausea or vomiting. 12 tablet 1  . polyethylene glycol (MIRALAX / GLYCOLAX) packet Take 17 g by mouth 2 (two) times daily. 14 each 0  . promethazine (PHENERGAN) 25 MG tablet Take 1 tablet (25 mg total) by mouth every 6 (six) hours as needed for nausea or vomiting. 12 tablet 1  . rivaroxaban (XARELTO) 20 MG TABS tablet Take 1 tablet (20 mg total) by mouth daily with supper. Please follow up with PCP to continue this medication. 30 tablet 0  . saccharomyces boulardii (FLORASTOR) 250 MG capsule Take 250 mg by mouth daily.    . traMADol (ULTRAM) 50 MG tablet Take 1 tablet (50 mg total) by mouth every 6 (six) hours as needed. (Patient taking differently: Take 50 mg by mouth 2 (two) times daily. Takes at 0700 and 1300) 30 tablet 0   No current facility-administered medications for this visit.     PHYSICAL EXAMINATION:  ECOG PERFORMANCE STATUS: 3 BP 116/63 (BP Location: Left Arm, Patient Position:  Standing)   Pulse 92 Comment: pt heartrate stayed at 88 while walking  Temp 98.3 F (36.8 C) (Oral)   Resp 18   Ht 5\' 3"  (1.6 m)   Wt 138 lb 12.8 oz (63 kg)   SpO2 98% Comment: pt o2 stayed between 98-100 while walking  BMI 24.59 kg/m   GENERAL:alert, not in distress, came in wheelchair  SKIN: skin color, texture, turgor are normal, no rashes or significant lesions  EYES: normal, Conjunctiva are pink and non-injected, sclera clear OROPHARYNX:no exudate, no erythema and lips, buccal mucosa, and tongue normal  NECK: supple, thyroid normal size, non-tender, without nodularity LYMPH:  no palpable lymphadenopathy in the cervical, axillary or inguinal LUNGS: clear to auscultation and percussion with normal breathing effort HEART: regular rate & rhythm and no murmurs and no lower extremity edema ABDOMEN:abdomen soft, mild diffuse tenderness, normal bowel sounds Musculoskeletal:no cyanosis of digits and no clubbing  NEURO: alert & oriented x 3 with fluent speech, no focal motor/sensory deficits  LABORATORY DATA:  I have reviewed the data as listed CBC Latest Ref Rng & Units 09/03/2017 08/20/2017 08/18/2017  WBC 3.9 - 10.3 10e3/uL 6.5 6.8 7.0  Hemoglobin 11.6 - 15.9 g/dL 12.5 12.0 12.1  Hematocrit 34.8 - 46.6 % 38.4 37.4 35.6(L)  Platelets 145 - 400 10e3/uL 364 300 265     CMP Latest Ref Rng & Units 09/03/2017 08/20/2017 08/18/2017  Glucose 70 - 140 mg/dl 102 95 91  BUN 7.0 - 26.0 mg/dL 6.0(L) 4.9(L) 7  Creatinine 0.6 - 1.1 mg/dL 0.7 0.7 0.58  Sodium 136 - 145 mEq/L 141 140 140  Potassium 3.5 - 5.1 mEq/L 3.9 3.4(L) 3.7  Chloride 101 - 111 mmol/L - - 110  CO2 22 - 29 mEq/L 26 28 26   Calcium 8.4 - 10.4 mg/dL 9.3 9.0 8.5(L)  Total Protein 6.4 - 8.3 g/dL 6.9 6.4 5.7(L)  Total Bilirubin 0.20 - 1.20 mg/dL 0.44 0.35 0.8  Alkaline Phos 40 - 150 U/L 86 78 65  AST 5 - 34 U/L 10 11 11(L)  ALT 0 - 55 U/L <3 <6 8(L)   PATHOLOGY:   Pathology Results: 04/22/17 Diagnosis Lymph node, needle/core  biopsy, left retroperitoneum periaortic - POORLY DIFFERENTIATED SQUAMOUS CELL CARCINOMA - SEE COMMENT  Microscopic Comment The needle core biopsy has a basaloid neoplasm with increased mitoses and necrosis. There is no residual lymph node parenchyma identified. The neoplasm is immunoreactive for cytokeratin 7 (patchy), cytokeratin 5/6, and p16 but negative for CDX2 and cytokeratin 20. Overall, the histology and immunophenotype support the diagnosis of a poorly differentiated squamous cell carcinoma.  Surgical Pathology 04/17/2017 Diagnosis Rectum, biopsy, distal - BENIGN COLONIC MUCOSA WITH EROSIONS AND ULCERATION, SEE COMMENT. - NO DYSPLASIA OR MALIGNANCY. Microscopic Comment There is lamina propria fibrosis and macrophages consistent with prior treatment. The case was called to Dr. Cristina Gong on 04/18/2017.  RADIOGRAPHIC STUDIES: I have personally reviewed the radiological images as listed and agreed with the findings in the report. No results found.   PET 08/01/17 IMPRESSION: 1. Mixed appearance of response, with significant improvement in the thoracic metastatic disease ; mild improvement in the abdominal adenopathy ; new and worsening metastatic disease in the liver; and a relatively stable left posterior perirectal mass. 2. Other imaging findings of potential clinical significance: Aortic Atherosclerosis (ICD10-I70.0). Prominent stool throughout the colon favors constipation.   PET Scan 05/13/2017 IMPRESSION: 1. Perirectal adenopathy and posterior perirectal hypermetabolic mass, associated with presumed metastatic adenopathy in knee left common iliac, retroperitoneal, thoracic paraesophageal, and left upper mediastinal regions. The left upper mediastinal mass extends into the left supraclavicular space. 2. Metastatic disease to the liver (best seen in segment 3) along with a hypermetabolic nodule compatible with metastatic disease in the superior segment of the lower lobe of  the left lung.   CT AP W Contrast 04/17/17: IMPRESSION: 1. Left posterior perirectal lobulated mass concerning for rectal malignancy. A smaller nodular density superior to this mass likely represent a mildly enlarged metastatic node. MRI is recommended for further evaluation. 2. 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 A hypodense lesion to the left of the L5 posterior to the psoas muscle is also consistent with metastatic disease. 3. Multiple hepatic hypodense lesions, incompletely characterized, possibly metastatic. MRI is recommended for further evaluation. 4. No evidence of bowel obstruction or active inflammation. Normal appendix.  ASSESSMENT & PLAN:  [name] is a 61 y.o. female from [place], with past medical history of DVT on Xarelto, rectal squamous cell carcinoma status post chemotherapy and radiation in 2016, stroke, asthma, who was admitted to hospital recently for nausea, vomiting and abdominal pain. She has had those symptoms since her recent hip surgery on 04/21/2017.   1. 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 -We reviewed her recent CT scan findings, and her retroperitoneal lymph node biopsy results in details with patient and her daughter. -Unfortunately, biopsy confirmed metastatic disease to abdominal lymph nodes. Her CT scan is also suspicious for liver and pelvic metastasis. She was not able to tolerate MRI to evaluate her liver lesions. -We discussed the incurable nature of her disease at this stage, and the goal of therapy is palliative. -I previously discussed her staging PET scan results, which showed metastasis in liver, left lung and diffuse  nodal metastasis in chest and abdomen.  -She has started first-line therapy with Nivolumab, tolerating well. -first restaging PET scan from 08/01/17 showed mixed response, her low cervical and thoracic lymph nodes have significantly decreased. However her liver metastasis have increased in size and metabolism. Her abdominal adenopathy has  been stable.  -After a lengthy discussion on 08/06/2017 , we decided to continue nivolumab with close follow-up. If she shows signs and symptoms of cancer progression we will move to second line chemo sooner. She agrees. -due to her recent hospital admission, will hold Nivo starting on 08/20/17, for 2 weeks  -we previously discussed that her recent symptoms could also be related to her cancer, and we can consider switching her treatment to chemo, pt was reluctant due to her previous poor tolerance to chemo  -We again discussed the option of changing Nivolumab to chemo Carbo Taxol or FOLFOX. This will be for as long as she can tolerate. After a lengthy discussion She will wait to next scan before making a discission on change in treatment.  -Repeat scan in 1 month  -I encouraged her to continue PT and increase her diet.  -f/u in 4 weeks   2.Nausea, anorexia and BM change  -She was recently admitted for N/V, anorexia and fever, ID work up negative, symptoms improved after su[pportive care -I previously recommended her to increase marinol to 10mg  bid -I previously suggested her to try megace for anorexia, she will think about it -I encouraged her to eat more to help her energy. She has not followed up with out dietician recently. I suggest her to f/u with them  -The overall symptoms has improved, however her calorie intake is still low  3. Intermittent dyspnea -She reports intermittent dyspnea, especially when she talks and coughs, no orthopnea, no leg swelling, no chest pain or fever, unlikely congestive heart failure or infection -We did a walking test, her oxygen  level remains to be 98-100% during walking. This is unlikely PE, she is on Xarelto for her history of DVT. -possible related to cough and or anxiety  -Continue observation  4. Depression/anxiety and insomnia  -I suggest Mirtazapine, but she wishes to wait until after she is weaned off hydrocodone, she has been having a lot of anxiety during the weaning process of narcotics. -For the time being I previously suggest sleep aid like melatonin over-the-counter  - I referred her to social worker  5. Left hip pain after hip replacement  -She underwent a total left hip replacement by Dr. Alvan Dame on 03/25/2017 -She has weaned off hydrocodone, on Tylenol No. 3 twice daily previously  -she will continue physical therapy as outpatient, she is overall improving. -I previously suggested the pt reduce Tylenol #3 by half tablets to replace with tramadol, starting with the morning dose.  -She uses tylenol #3 as needed and not much of tramadol since hospitalization.  -I discussed with her many cancer symptoms she is fine to remain on tylenol #3 as needed only. She can still use tramadol more if she would like.   6. Goal of care discussion  -We again discussed the incurable nature of her cancer, and the overall poor prognosis, especially if she does not have good response to chemotherapy or progress on chemo -The patient understands the goal of care is palliative. -I previously recommend DNR/DNI, she will think about it    PLAN:  -Refill Reglan today  -Will restart nivo today and continue every 2 weeks -Lab and f/u in 2 weeks -plan to repeat PET in 4-6 weeks    No orders of the defined types were placed in this encounter.  All questions were answered. The patient knows to call the clinic with any problems, questions or concerns. No barriers to learning was detected.  I spent 25 minutes  counseling the patient face to face. The total time spent in the appointment was 30 minutes and more than 50% was on  counseling and review of test results  This document serves as a record of services personally performed by Truitt Merle, MD. It was created on her behalf by Joslyn Devon, a trained medical scribe. The creation of this record is based on the scribe's personal observations and the provider's statements to them. This document has been checked and approved by the attending provider.     Truitt Merle  09/03/2017

## 2017-09-02 ENCOUNTER — Other Ambulatory Visit: Payer: Self-pay | Admitting: Hematology

## 2017-09-03 ENCOUNTER — Telehealth: Payer: Self-pay | Admitting: Hematology

## 2017-09-03 ENCOUNTER — Ambulatory Visit (HOSPITAL_BASED_OUTPATIENT_CLINIC_OR_DEPARTMENT_OTHER): Payer: Federal, State, Local not specified - PPO | Admitting: Hematology

## 2017-09-03 ENCOUNTER — Encounter: Payer: Self-pay | Admitting: Hematology

## 2017-09-03 ENCOUNTER — Ambulatory Visit: Payer: Federal, State, Local not specified - PPO | Admitting: Nutrition

## 2017-09-03 ENCOUNTER — Other Ambulatory Visit (HOSPITAL_BASED_OUTPATIENT_CLINIC_OR_DEPARTMENT_OTHER): Payer: Federal, State, Local not specified - PPO

## 2017-09-03 ENCOUNTER — Ambulatory Visit (HOSPITAL_BASED_OUTPATIENT_CLINIC_OR_DEPARTMENT_OTHER): Payer: Federal, State, Local not specified - PPO

## 2017-09-03 ENCOUNTER — Ambulatory Visit: Payer: Federal, State, Local not specified - PPO

## 2017-09-03 ENCOUNTER — Other Ambulatory Visit: Payer: Federal, State, Local not specified - PPO

## 2017-09-03 ENCOUNTER — Encounter: Payer: Self-pay | Admitting: General Practice

## 2017-09-03 VITALS — BP 116/63 | HR 92 | Temp 98.3°F | Resp 18 | Ht 63.0 in | Wt 138.8 lb

## 2017-09-03 DIAGNOSIS — C801 Malignant (primary) neoplasm, unspecified: Secondary | ICD-10-CM

## 2017-09-03 DIAGNOSIS — C787 Secondary malignant neoplasm of liver and intrahepatic bile duct: Secondary | ICD-10-CM

## 2017-09-03 DIAGNOSIS — F418 Other specified anxiety disorders: Secondary | ICD-10-CM | POA: Diagnosis not present

## 2017-09-03 DIAGNOSIS — I82409 Acute embolism and thrombosis of unspecified deep veins of unspecified lower extremity: Secondary | ICD-10-CM

## 2017-09-03 DIAGNOSIS — R0609 Other forms of dyspnea: Secondary | ICD-10-CM

## 2017-09-03 DIAGNOSIS — C210 Malignant neoplasm of anus, unspecified: Secondary | ICD-10-CM

## 2017-09-03 DIAGNOSIS — C772 Secondary and unspecified malignant neoplasm of intra-abdominal lymph nodes: Secondary | ICD-10-CM

## 2017-09-03 DIAGNOSIS — Z7189 Other specified counseling: Secondary | ICD-10-CM

## 2017-09-03 DIAGNOSIS — Z86718 Personal history of other venous thrombosis and embolism: Secondary | ICD-10-CM | POA: Diagnosis not present

## 2017-09-03 DIAGNOSIS — Z5112 Encounter for antineoplastic immunotherapy: Secondary | ICD-10-CM

## 2017-09-03 DIAGNOSIS — G4700 Insomnia, unspecified: Secondary | ICD-10-CM

## 2017-09-03 DIAGNOSIS — Z95828 Presence of other vascular implants and grafts: Secondary | ICD-10-CM

## 2017-09-03 LAB — COMPREHENSIVE METABOLIC PANEL
ANION GAP: 7 meq/L (ref 3–11)
AST: 10 U/L (ref 5–34)
Albumin: 3.4 g/dL — ABNORMAL LOW (ref 3.5–5.0)
Alkaline Phosphatase: 86 U/L (ref 40–150)
BUN: 6 mg/dL — ABNORMAL LOW (ref 7.0–26.0)
CALCIUM: 9.3 mg/dL (ref 8.4–10.4)
CHLORIDE: 109 meq/L (ref 98–109)
CO2: 26 mEq/L (ref 22–29)
Creatinine: 0.7 mg/dL (ref 0.6–1.1)
Glucose: 102 mg/dl (ref 70–140)
POTASSIUM: 3.9 meq/L (ref 3.5–5.1)
SODIUM: 141 meq/L (ref 136–145)
Total Bilirubin: 0.44 mg/dL (ref 0.20–1.20)
Total Protein: 6.9 g/dL (ref 6.4–8.3)

## 2017-09-03 LAB — CBC WITH DIFFERENTIAL/PLATELET
BASO%: 0.7 % (ref 0.0–2.0)
Basophils Absolute: 0 10*3/uL (ref 0.0–0.1)
EOS%: 8.1 % — AB (ref 0.0–7.0)
Eosinophils Absolute: 0.5 10*3/uL (ref 0.0–0.5)
HEMATOCRIT: 38.4 % (ref 34.8–46.6)
HGB: 12.5 g/dL (ref 11.6–15.9)
LYMPH#: 1.1 10*3/uL (ref 0.9–3.3)
LYMPH%: 16.9 % (ref 14.0–49.7)
MCH: 26.6 pg (ref 25.1–34.0)
MCHC: 32.5 g/dL (ref 31.5–36.0)
MCV: 81.7 fL (ref 79.5–101.0)
MONO#: 0.6 10*3/uL (ref 0.1–0.9)
MONO%: 9.9 % (ref 0.0–14.0)
NEUT#: 4.2 10*3/uL (ref 1.5–6.5)
NEUT%: 64.4 % (ref 38.4–76.8)
Platelets: 364 10*3/uL (ref 145–400)
RBC: 4.7 10*6/uL (ref 3.70–5.45)
RDW: 17.6 % — ABNORMAL HIGH (ref 11.2–14.5)
WBC: 6.5 10*3/uL (ref 3.9–10.3)

## 2017-09-03 MED ORDER — SODIUM CHLORIDE 0.9% FLUSH
10.0000 mL | INTRAVENOUS | Status: DC | PRN
  Administered 2017-09-03: 10 mL
  Filled 2017-09-03: qty 10

## 2017-09-03 MED ORDER — SODIUM CHLORIDE 0.9 % IV SOLN
240.0000 mg | Freq: Once | INTRAVENOUS | Status: AC
  Administered 2017-09-03: 240 mg via INTRAVENOUS
  Filled 2017-09-03: qty 24

## 2017-09-03 MED ORDER — SODIUM CHLORIDE 0.9% FLUSH
10.0000 mL | Freq: Once | INTRAVENOUS | Status: AC
  Administered 2017-09-03: 10 mL
  Filled 2017-09-03: qty 10

## 2017-09-03 MED ORDER — METOCLOPRAMIDE HCL 10 MG PO TABS: 10.0000 mg | ORAL_TABLET | Freq: Three times a day (TID) | ORAL | 2 refills | Status: AC

## 2017-09-03 MED ORDER — HEPARIN SOD (PORK) LOCK FLUSH 100 UNIT/ML IV SOLN
500.0000 [IU] | Freq: Once | INTRAVENOUS | Status: AC | PRN
  Administered 2017-09-03: 500 [IU]
  Filled 2017-09-03: qty 5

## 2017-09-03 MED ORDER — SODIUM CHLORIDE 0.9 % IV SOLN
Freq: Once | INTRAVENOUS | Status: AC
  Administered 2017-09-03: 13:00:00 via INTRAVENOUS

## 2017-09-03 NOTE — Progress Notes (Signed)
Nutrition follow-up completed with patient and her daughter.  Patient is receiving treatment for anal cancer. Weight continues to decrease and was documented as 138.8 pounds on September 12 decreased from 140.1 pounds August 29. Patient continues to complain of poor appetite. She refuses to eat foods that do not appeal to her. Her daughter is supportive and tries to provide foods at patient request but she is not always able to accommodate patient. Often times after patient receives requested food, she refuses to eat it. Patient denies nausea and vomiting with eating.  Nutrition diagnosis: Food and nutrition related knowledge deficit resolved.  Intervention: Patient understands need to consume increased calories and protein, however food just does not appeal to her. She is refusing oral nutrition supplements. Provided support and encouragement. Teach back method used.  Monitoring, evaluation, goals: Patient will work to increase calories and protein to minimize further weight loss.  No follow-up scheduled.  Food and nutrition related knowledge deficit has resolved   **Disclaimer: This note was dictated with voice recognition software. Similar sounding words can inadvertently be transcribed and this note may contain transcription errors which may not have been corrected upon publication of note.**

## 2017-09-03 NOTE — Telephone Encounter (Signed)
No 9/12 los.  

## 2017-09-03 NOTE — Progress Notes (Signed)
[facility] Note  Followed up with [name], meeting her daughter [name], in infusion today.  [name] verbalized deep appreciation for ongoing spiritual accompaniment and particularly values having a [name] conversation partner as she reflects on her life and prepares for her death.  She utilizes each contact well to conduct life review and engage in theological reflection.  We plan to say in touch by phone for further support, but please also page if immediate needs arise or circumstances change.  Thank you.   [place], [place], [facility] Pager [phone] Voicemail [phone]

## 2017-09-03 NOTE — Patient Instructions (Signed)
[facility] Discharge Instructions for Patients Receiving Chemotherapy  Today you received the following chemotherapy agents Opdivo.  To help prevent nausea and vomiting after your treatment, we encourage you to take your nausea medication as directed.   If you develop nausea and vomiting that is not controlled by your nausea medication, call the clinic.   BELOW ARE SYMPTOMS THAT SHOULD BE REPORTED IMMEDIATELY:  *FEVER GREATER THAN 100.5 F  *CHILLS WITH OR WITHOUT FEVER  NAUSEA AND VOMITING THAT IS NOT CONTROLLED WITH YOUR NAUSEA MEDICATION  *UNUSUAL SHORTNESS OF BREATH  *UNUSUAL BRUISING OR BLEEDING  TENDERNESS IN MOUTH AND THROAT WITH OR WITHOUT PRESENCE OF ULCERS  *URINARY PROBLEMS  *BOWEL PROBLEMS  UNUSUAL RASH Items with * indicate a potential emergency and should be followed up as soon as possible.  Feel free to call the clinic you have any questions or concerns. The clinic phone number is [phone].  Please show the CHEMO ALERT CARD at check-in to the Emergency Department and triage nurse.    

## 2017-09-05 ENCOUNTER — Other Ambulatory Visit: Payer: Self-pay | Admitting: Hematology

## 2017-09-05 DIAGNOSIS — C210 Malignant neoplasm of anus, unspecified: Secondary | ICD-10-CM

## 2017-09-05 DIAGNOSIS — R112 Nausea with vomiting, unspecified: Secondary | ICD-10-CM

## 2017-09-05 MED FILL — DRONABINOL 5 MG CAPSULE: 5 | 30 days supply | Qty: 60 | Fill #1

## 2017-09-10 ENCOUNTER — Telehealth: Payer: Self-pay | Admitting: *Deleted

## 2017-09-11 ENCOUNTER — Telehealth: Payer: Self-pay

## 2017-09-11 NOTE — Telephone Encounter (Signed)
Pt's daughter called yest regarding appts not scheduled.  Message sent to Scheduler to return call with appts.

## 2017-09-17 ENCOUNTER — Ambulatory Visit: Payer: Federal, State, Local not specified - PPO

## 2017-09-17 ENCOUNTER — Ambulatory Visit: Payer: Federal, State, Local not specified - PPO | Admitting: Hematology

## 2017-09-17 ENCOUNTER — Encounter: Payer: Self-pay | Admitting: Hematology

## 2017-09-17 ENCOUNTER — Ambulatory Visit (HOSPITAL_BASED_OUTPATIENT_CLINIC_OR_DEPARTMENT_OTHER): Payer: Federal, State, Local not specified - PPO | Admitting: Hematology

## 2017-09-17 ENCOUNTER — Other Ambulatory Visit (HOSPITAL_BASED_OUTPATIENT_CLINIC_OR_DEPARTMENT_OTHER): Payer: Federal, State, Local not specified - PPO

## 2017-09-17 ENCOUNTER — Ambulatory Visit (HOSPITAL_BASED_OUTPATIENT_CLINIC_OR_DEPARTMENT_OTHER): Payer: Federal, State, Local not specified - PPO

## 2017-09-17 ENCOUNTER — Other Ambulatory Visit: Payer: Federal, State, Local not specified - PPO

## 2017-09-17 DIAGNOSIS — C778 Secondary and unspecified malignant neoplasm of lymph nodes of multiple regions: Secondary | ICD-10-CM

## 2017-09-17 DIAGNOSIS — Z7189 Other specified counseling: Secondary | ICD-10-CM

## 2017-09-17 DIAGNOSIS — K6289 Other specified diseases of anus and rectum: Secondary | ICD-10-CM

## 2017-09-17 DIAGNOSIS — R194 Change in bowel habit: Secondary | ICD-10-CM | POA: Diagnosis not present

## 2017-09-17 DIAGNOSIS — R0609 Other forms of dyspnea: Secondary | ICD-10-CM

## 2017-09-17 DIAGNOSIS — Z5112 Encounter for antineoplastic immunotherapy: Secondary | ICD-10-CM

## 2017-09-17 DIAGNOSIS — C787 Secondary malignant neoplasm of liver and intrahepatic bile duct: Secondary | ICD-10-CM | POA: Diagnosis not present

## 2017-09-17 DIAGNOSIS — I82409 Acute embolism and thrombosis of unspecified deep veins of unspecified lower extremity: Secondary | ICD-10-CM

## 2017-09-17 DIAGNOSIS — C20 Malignant neoplasm of rectum: Secondary | ICD-10-CM | POA: Diagnosis not present

## 2017-09-17 DIAGNOSIS — Z86711 Personal history of pulmonary embolism: Secondary | ICD-10-CM

## 2017-09-17 DIAGNOSIS — R112 Nausea with vomiting, unspecified: Secondary | ICD-10-CM

## 2017-09-17 DIAGNOSIS — C210 Malignant neoplasm of anus, unspecified: Secondary | ICD-10-CM

## 2017-09-17 DIAGNOSIS — F418 Other specified anxiety disorders: Secondary | ICD-10-CM | POA: Diagnosis not present

## 2017-09-17 LAB — CBC WITH DIFFERENTIAL/PLATELET
BASO%: 0.3 % (ref 0.0–2.0)
Basophils Absolute: 0 10*3/uL (ref 0.0–0.1)
EOS%: 8.1 % — AB (ref 0.0–7.0)
Eosinophils Absolute: 0.6 10*3/uL — ABNORMAL HIGH (ref 0.0–0.5)
HEMATOCRIT: 37.3 % (ref 34.8–46.6)
HEMOGLOBIN: 12.5 g/dL (ref 11.6–15.9)
LYMPH#: 1.3 10*3/uL (ref 0.9–3.3)
LYMPH%: 17.6 % (ref 14.0–49.7)
MCH: 26.8 pg (ref 25.1–34.0)
MCHC: 33.5 g/dL (ref 31.5–36.0)
MCV: 79.9 fL (ref 79.5–101.0)
MONO#: 0.9 10*3/uL (ref 0.1–0.9)
MONO%: 11.9 % (ref 0.0–14.0)
NEUT#: 4.6 10*3/uL (ref 1.5–6.5)
NEUT%: 62.1 % (ref 38.4–76.8)
Platelets: 287 10*3/uL (ref 145–400)
RBC: 4.67 10*6/uL (ref 3.70–5.45)
RDW: 17 % — AB (ref 11.2–14.5)
WBC: 7.5 10*3/uL (ref 3.9–10.3)

## 2017-09-17 LAB — COMPREHENSIVE METABOLIC PANEL
ALBUMIN: 3.6 g/dL (ref 3.5–5.0)
ALK PHOS: 85 U/L (ref 40–150)
AST: 14 U/L (ref 5–34)
Anion Gap: 7 mEq/L (ref 3–11)
BUN: 8.2 mg/dL (ref 7.0–26.0)
CALCIUM: 9.4 mg/dL (ref 8.4–10.4)
CHLORIDE: 108 meq/L (ref 98–109)
CO2: 26 mEq/L (ref 22–29)
CREATININE: 0.7 mg/dL (ref 0.6–1.1)
EGFR: >90 mL/min/{1.73_m2} (ref >90)
GLUCOSE: 100 mg/dL (ref 70–140)
Potassium: 4 mEq/L (ref 3.5–5.1)
Sodium: 142 mEq/L (ref 136–145)
Total Bilirubin: 0.49 mg/dL (ref 0.20–1.20)
Total Protein: 7.1 g/dL (ref 6.4–8.3)

## 2017-09-17 LAB — TSH: TSH: 1.345 m[IU]/L (ref 0.308–3.960)

## 2017-09-17 MED ORDER — SODIUM CHLORIDE 0.9 % IV SOLN
Freq: Once | INTRAVENOUS | Status: AC
  Administered 2017-09-17: 14:00:00 via INTRAVENOUS

## 2017-09-17 MED ORDER — HEPARIN SOD (PORK) LOCK FLUSH 100 UNIT/ML IV SOLN
500.0000 [IU] | Freq: Once | INTRAVENOUS | Status: AC | PRN
  Administered 2017-09-17: 500 [IU]
  Filled 2017-09-17: qty 5

## 2017-09-17 MED ORDER — ACETAMINOPHEN-CODEINE #3 300-30 MG PO TABS: 1.0000 | ORAL_TABLET | Freq: Three times a day (TID) | ORAL | 0 refills | Status: AC | PRN

## 2017-09-17 MED ORDER — DRONABINOL 10 MG PO CAPS: 10.0000 mg | ORAL_CAPSULE | Freq: Two times a day (BID) | ORAL | 0 refills | Status: DC

## 2017-09-17 MED ORDER — SODIUM CHLORIDE 0.9 % IV SOLN
240.0000 mg | Freq: Once | INTRAVENOUS | Status: AC
  Administered 2017-09-17: 240 mg via INTRAVENOUS
  Filled 2017-09-17: qty 24

## 2017-09-17 MED ORDER — SODIUM CHLORIDE 0.9% FLUSH
10.0000 mL | INTRAVENOUS | Status: DC | PRN
  Administered 2017-09-17: 10 mL
  Filled 2017-09-17: qty 10

## 2017-09-17 NOTE — Progress Notes (Signed)
[facility]  [phone] [phone]) [phone]  Clinic Follow up Note   Patient Care Team: Damaris Hippo, MD as PCP - General (Family Medicine) 09/17/2017   CHIEF COMPLAINS f/u metastatic anal cancer  SUMMARY OF ONCOLOGIC HISTORY: Oncology History   cT2N2M0 stage IIIB      Anal cancer ([facility])   03/09/2015 Initial Diagnosis    Rectal cancer ([facility] [place])      04/19/2015 Imaging    PET scan showed hypermetabolic rectal lesion and inguinal lymph nodes      05/08/2015 - 07/13/2015 Radiation Therapy    59 Gy in 35 fractions to pelvis and inguinal LNs for her anal cancer. She tolerated treatment poorly with persistent nausea, vomiting, diarrhea and a poor appetite throughout treatment, and required several hospitalization and treatment delays. She did complete the planned treatment.       05/08/2015 - 06/2015 Chemotherapy    Concurrent chemotherapy with 5-FU and mitomycin along with radiation. Pt tolerated poorly.       01/08/2016 Imaging    PET scan showed you hypermetabolic left mediastinal 1L adenopathy (1.5cm), partial response to therapy with interval decrease in extent of hypermetabolic rectal soft tissue fullness as well as resolution of inguinal adenopathy.      02/06/2017 Procedure    Colonoscopy showed scar tissue just off anal verge, no lesion otherwise.      04/16/2017 - 04/23/2017 Hospital Admission    Admitted 04/16/17 - 04/23/17 for Nausea and Vomiting and abdominal pain. CT abd noted to have findings including rectal mass and enlarged nodes. Patient underwent sigmoidoscopy with biopsy showing benign findings. IR was consulted for node biopsy and which was done and awaiting results.      04/17/2017 Imaging    CT AP W Contrast 04/17/17: IMPRESSION: 1. Left posterior perirectal lobulated mass concerning for rectal malignancy. A smaller nodular density superior to this mass likely represent a mildly enlarged metastatic node. MRI is recommended for further  evaluation. 2. 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 A hypodense lesion to the left of the L5 posterior to the psoas muscle is also consistent with metastatic disease. 3. Multiple hepatic hypodense lesions, incompletely characterized, possibly metastatic. MRI is recommended for further evaluation. 4. No evidence of bowel obstruction or active inflammation. Normal appendix.      04/17/2017 Pathology Results    Diagnosis Rectum, biopsy, distal - BENIGN COLONIC MUCOSA WITH EROSIONS AND ULCERATION, SEE COMMENT. - NO DYSPLASIA OR MALIGNANCY.       04/22/2017 Pathology Results    Diagnosis Lymph node, needle/core biopsy, left retroperitoneum periaortic - POORLY DIFFERENTIATED SQUAMOUS CELL CARCINOMA - SEE COMMENT Microscopic Comment The needle core biopsy has a basaloid neoplasm with increased mitoses and necrosis. There is no residual lymph node parenchyma identified. The neoplasm is immunoreactive for cytokeratin 7 (patchy), cytokeratin 5/6, and p16 but negative for CDX2 and cytokeratin 20. Overall, the histology and immunophenotype support the diagnosis of a poorly differentiated squamous cell carcinoma.       05/13/2017 PET scan    IMPRESSION: 1. Perirectal adenopathy and posterior perirectal hypermetabolic mass, associated with presumed metastatic adenopathy in knee left common iliac, retroperitoneal, thoracic paraesophageal, and left upper mediastinal regions. The left upper mediastinal mass extends into the left supraclavicular space. 2. Metastatic disease to the liver (best seen in segment 3) along with a hypermetabolic nodule compatible with metastatic disease in the superior segment of the lower lobe of the left lung.      05/16/2017 -  Antibody Plan     Nivolumab 240mg  every 2 weeks started on 05/16/17       08/01/2017 PET scan    PET 08/01/17 IMPRESSION: 1. Mixed appearance of response, with significant  improvement in the thoracic metastatic disease ; mild improvement in the abdominal adenopathy ; new and worsening metastatic disease in the liver; and a relatively stable left posterior perirectal mass. 2. Other imaging findings of potential clinical significance: Aortic Atherosclerosis (ICD10-I70.0). Prominent stool throughout the colon favors constipation.       08/13/2017 Imaging    CT Abdomen/Pelvis w contrast IMPRESSION: Stable rectal wall thickening and posterior rectal wall mass compatible with patient's history of rectal cancer.  Stable hepatic metastases and bulky retroperitoneal metastases.  No evidence of bowel obstruction.      08/14/2017 -  Hospital Admission    presented to the ED yesterday with constipation, nausea, abdominal discomfort and vomiting episodes      History of Present Illness (04/17/17) [name] is a 61 y.o. 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 I was called to see her in hospital.   She is from [place], with past medical history of DVT on Xarelto, rectal cancer status post chemotherapy and radiation in 2016, stroke, asthma, who was admitted to hospital last night for nausea, vomiting and abdominal pain. CT of abdomen and pelvis showed left posterior perirectal lobulated mass concerning for rectum malignancy. Retroperitoneal adenopathy, I hypodense lesion to the left L5 to the sore S muscle, suspicious for metastasis, and multiple hepatic hypodense lesions, indeterminate. I was called to see the patient to ruled out metastatic rectal cancer recurrence. I saw the patient before her endoscopy today.  The patient reports she has a prior history of rectal cancer stage 3, squamous cell, which was treated with chemotherapy and radiation therapy. She did not have surgery. The patient reports she completed treatment in September 2016. The patient has not used her port since 2016, though  it is still in place. She reports the last time she followed up with physicians for her prior cancer, they told her she had suspicious masses in her lungs and liver.  The patient does not have any medical records from [place], where she previously lived and was treated, but reports her daughter would have them. She reports she does not know if she plans to stay in [place], or if she will return to [place].    Current Treatment:  Nivolumab 240mg  every 2 weeks started on 05/16/17, held on 08/20/17 due to poor toleration. Restart nivolamab 09/03/17   INTERVAL HISTORY:  [name] is here for a follow up. She presents to the clinic today with her daughter.   She reports to having better care at home, the nurse from [place] came to [place] to help her at home. She is eating better but still not much of an appetite. She aims for 3 meals a day. She takes vitamins and Kind bars. Her weight is stable. Her nausea is fine currently. Her Bowels are again Backed up for the last 3 days. She is taking it Mirlax but only once. She is worried of diarrhea with Magnesium citrate. She has lactulose at home. She will take if she cannot have BM in 3 days.   She reports to her head getting "fuzzy" She denies headaches. She motes rectal pain but tolerable. Tylenol #3 once has ben helping her. Sometimes she will need more. Marinol has been  helping but her daughter wonders can she increase frequency.   Her daughter will be out of town the 22nd-30th of October. Someone else will bring the patient.     REVIEW OF SYSTEMS:   Constitutional: Denies abnormal weight loss (+)still overall fatigued (+) low appetite (+) chemo brain "fuzzy" Eyes: Denies blurriness of vision Ears, nose, mouth, throat, and face: Denies mucositis or sore throat  (+) voice change Respiratory: negative Cardiovascular: Denies palpitation, chest discomfort or lower extremity swelling Gastrointestinal:  Denies heartburn  (+)  nausea, tolerable  (+) Low amounts of BM daily/constipation Skin: Denies abnormal skin rashes Lymphatics: Denies new lymphadenopathy or easy bruising Neurological:Denies numbness, tingling or new weaknesses MSK: (+) occasional  hip pain from surgery (+) pain in rectal area when sitting for extended periods of time Behavioral/Psych: Mood is stable, no new changes  All other systems were reviewed with the patient and are negative.  MEDICAL HISTORY:  Past Medical History:  Diagnosis Date  . Anxiety   . Arthritis   . Asthma    childhood   . Cancer [facility])    colorectal cancer with chemo and radiation   . DVT (deep venous thrombosis) (H[facility])    left lower extremity  . Dyspnea    with anxiety and exertion  . GERD (gastroesophageal reflux disease)   . Neuromuscular disorder ([facility])    poly neuropathy   . Stroke [facility]) 2010, 2011   TIA  x2    SURGICAL HISTORY: Past Surgical History:  Procedure Laterality Date  . CESAREAN SECTION    . FLEXIBLE SIGMOIDOSCOPY N/A 04/17/2017   Procedure: FLEXIBLE SIGMOIDOSCOPY;  Surgeon: Ronald Lobo, MD;  Location: [facility] ENDOSCOPY;  Service: Endoscopy;  Laterality: N/A;  . IR FLUORO GUIDE PORT INSERTION LEFT  06/04/2017  . IR REMOVAL TUN ACCESS W/ PORT W/O FL MOD SED  06/04/2017  . IR US GUIDE VASC ACCESS LEFT  06/04/2017  . IR VENO/EXT/UNI LEFT  06/04/2017  . THYROID CYST EXCISION    . TOTAL HIP ARTHROPLASTY Left 03/25/2017   Procedure: LEFT TOTAL HIP ARTHROPLASTY ANTERIOR APPROACH;  Surgeon: Paralee Cancel, MD;  Location: [facility] ORS;  Service: Orthopedics;  Laterality: Left;  Requests 70 mins    I have reviewed the social history and family history with the patient and they are unchanged from previous note.  ALLERGIES:  is allergic to ativan [lorazepam] and penicillins.  MEDICATIONS:  Current Outpatient Prescriptions  Medication Sig Dispense Refill  . acetaminophen-codeine (TYLENOL #3) 300-30 MG tablet Take 1 tablet by mouth every 8 (eight) hours as needed. 40  tablet 0  . docusate sodium (COLACE) 100 MG capsule Take 1 capsule (100 mg total) by mouth 2 (two) times daily. 10 capsule 0  . LACTULOSE PO Take 1 Package by mouth daily as needed (constipation).     Marland Kitchen lidocaine-prilocaine (EMLA) cream Apply 1 application topically as needed. (Patient taking differently: Apply 1 application topically as needed (for port). ) 30 g 6  . loperamide (IMODIUM A-D) 2 MG tablet Take 1 tablet (2 mg total) by mouth 4 (four) times daily as needed for diarrhea or loose stools. 30 tablet 0  . magnesium citrate SOLN Take 1 Bottle by mouth once.    . methocarbamol (ROBAXIN) 500 MG tablet Take 1 tablet (500 mg total) by mouth every 6 (six) hours as needed for muscle spasms. 40 tablet 0  . metoCLOPramide (REGLAN) 10 MG tablet Take 1 tablet (10 mg total) by mouth 4 (four) times daily -  before meals and  at bedtime. 120 tablet 2  . Multiple Vitamins-Minerals (CENTRUM ADULTS) TABS Take 1 tablet by mouth daily.    Marland Kitchen omeprazole (PRILOSEC OTC) 20 MG tablet Take 20 mg by mouth daily as needed (for acid reflex).    . ondansetron (ZOFRAN ODT) 4 MG disintegrating tablet Take 1 tablet (4 mg total) by mouth every 8 (eight) hours as needed for nausea or vomiting. 12 tablet 1  . polyethylene glycol (MIRALAX / GLYCOLAX) packet Take 17 g by mouth 2 (two) times daily. 14 each 0  . promethazine (PHENERGAN) 25 MG tablet Take 1 tablet (25 mg total) by mouth every 6 (six) hours as needed for nausea or vomiting. 12 tablet 1  . rivaroxaban (XARELTO) 20 MG TABS tablet Take 1 tablet (20 mg total) by mouth daily with supper. Please follow up with PCP to continue this medication. 30 tablet 0  . saccharomyces boulardii (FLORASTOR) 250 MG capsule Take 250 mg by mouth daily.    . traMADol (ULTRAM) 50 MG tablet Take 1 tablet (50 mg total) by mouth every 6 (six) hours as needed. (Patient taking differently: Take 50 mg by mouth 2 (two) times daily. Takes at 0700 and 1300) 30 tablet 0  . dronabinol (MARINOL) 10 MG  capsule Take 1 capsule (10 mg total) by mouth 2 (two) times daily before a meal. 60 capsule 0   No current facility-administered medications for this visit.     PHYSICAL EXAMINATION:  ECOG PERFORMANCE STATUS: 3 BP: 116/63 P: 92 R; 18  GENERAL:alert, not in distress, came in wheelchair  SKIN: skin color, texture, turgor are normal, no rashes or significant lesions  EYES: normal, Conjunctiva are pink and non-injected, sclera clear OROPHARYNX:no exudate, no erythema and lips, buccal mucosa, and tongue normal  NECK: supple, thyroid normal size, non-tender, without nodularity LYMPH:  no palpable lymphadenopathy in the cervical, axillary or inguinal LUNGS: clear to auscultation and percussion with normal breathing effort HEART: regular rate & rhythm and no murmurs and no lower extremity edema ABDOMEN:abdomen soft, mild diffuse tenderness (+)  bowel sounds moving slow  Musculoskeletal:no cyanosis of digits and no clubbing  NEURO: alert & oriented x 3 with fluent speech, no focal motor/sensory deficits  LABORATORY DATA:  I have reviewed the data as listed CBC Latest Ref Rng & Units 09/17/2017 09/03/2017 08/20/2017  WBC 3.9 - 10.3 10e3/uL 7.5 6.5 6.8  Hemoglobin 11.6 - 15.9 g/dL 12.5 12.5 12.0  Hematocrit 34.8 - 46.6 % 37.3 38.4 37.4  Platelets 145 - 400 10e3/uL 287 364 300     CMP Latest Ref Rng & Units 09/17/2017 09/03/2017 08/20/2017  Glucose 70 - 140 mg/dl 100 102 95  BUN 7.0 - 26.0 mg/dL 8.2 6.0(L) 4.9(L)  Creatinine 0.6 - 1.1 mg/dL 0.7 0.7 0.7  Sodium 136 - 145 mEq/L 142 141 140  Potassium 3.5 - 5.1 mEq/L 4.0 3.9 3.4(L)  Chloride 101 - 111 mmol/L - - -  CO2 22 - 29 mEq/L 26 26 28   Calcium 8.4 - 10.4 mg/dL 9.4 9.3 9.0  Total Protein 6.4 - 8.3 g/dL 7.1 6.9 6.4  Total Bilirubin 0.20 - 1.20 mg/dL 0.49 0.44 0.35  Alkaline Phos 40 - 150 U/L 85 86 78  AST 5 - 34 U/L 14 10 11   ALT 0-55 U/L U/L <6 <3 <6   PATHOLOGY:   Pathology Results: 04/22/17 Diagnosis Lymph node, needle/core biopsy,  left retroperitoneum periaortic - POORLY DIFFERENTIATED SQUAMOUS CELL CARCINOMA - SEE COMMENT Microscopic Comment The needle core biopsy has a basaloid  neoplasm with increased mitoses and necrosis. There is no residual lymph node parenchyma identified. The neoplasm is immunoreactive for cytokeratin 7 (patchy), cytokeratin 5/6, and p16 but negative for CDX2 and cytokeratin 20. Overall, the histology and immunophenotype support the diagnosis of a poorly differentiated squamous cell carcinoma.  Surgical Pathology 04/17/2017 Diagnosis Rectum, biopsy, distal - BENIGN COLONIC MUCOSA WITH EROSIONS AND ULCERATION, SEE COMMENT. - NO DYSPLASIA OR MALIGNANCY. Microscopic Comment There is lamina propria fibrosis and macrophages consistent with prior treatment. The case was called to Dr. Cristina Gong on 04/18/2017.  RADIOGRAPHIC STUDIES: I have personally reviewed the radiological images as listed and agreed with the findings in the report. No results found.   PET 08/01/17 IMPRESSION: 1. Mixed appearance of response, with significant improvement in the thoracic metastatic disease ; mild improvement in the abdominal adenopathy ; new and worsening metastatic disease in the liver; and a relatively stable left posterior perirectal mass. 2. Other imaging findings of potential clinical significance: Aortic Atherosclerosis (ICD10-I70.0). Prominent stool throughout the colon favors constipation.   PET Scan 05/13/2017 IMPRESSION: 1. Perirectal adenopathy and posterior perirectal hypermetabolic mass, associated with presumed metastatic adenopathy in knee left common iliac, retroperitoneal, thoracic paraesophageal, and left upper mediastinal regions. The left upper mediastinal mass extends into the left supraclavicular space. 2. Metastatic disease to the liver (best seen in segment 3) along with a hypermetabolic nodule compatible with metastatic disease in the superior segment of the lower lobe of the left  lung.   CT AP W Contrast 04/17/17: IMPRESSION: 1. Left posterior perirectal lobulated mass concerning for rectal malignancy. A smaller nodular density superior to this mass likely represent a mildly enlarged metastatic node. MRI is recommended for further evaluation. 2. 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 A hypodense lesion to the left of the L5 posterior to the psoas muscle is also consistent with metastatic disease. 3. Multiple hepatic hypodense lesions, incompletely characterized, possibly metastatic. MRI is recommended for further evaluation. 4. No evidence of bowel obstruction or active inflammation. Normal appendix.  ASSESSMENT & PLAN:  [name] is a 61 y.o. female from [place], with past medical history of DVT on Xarelto, rectal squamous cell carcinoma status post chemotherapy and radiation in 2016, stroke, asthma, who was admitted to hospital recently for nausea, vomiting and abdominal pain. She has had those symptoms since her recent hip surgery on 04/21/2017.   1. 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 -We reviewed her recent CT scan findings, and her retroperitoneal lymph node biopsy results in details with patient and her daughter. -Unfortunately, biopsy confirmed metastatic disease to abdominal lymph nodes. Her CT scan is also suspicious for liver and pelvic metastasis. She was not able to tolerate MRI to evaluate her liver lesions. -We discussed the incurable nature of her disease at this stage, and the goal of therapy is palliative. -I previously discussed her staging PET scan results, which showed metastasis in liver, left lung and diffuse nodal  metastasis in chest and abdomen.  -She has started first-line therapy with Nivolumab, tolerating well. -first restaging PET scan from 08/01/17 showed mixed response, her low cervical and thoracic lymph nodes have significantly decreased. However her liver metastasis have increased in size and metabolism. Her abdominal adenopathy has been stable.  -After a lengthy discussion on 08/06/2017 ,  we decided to continue nivolumab with close follow-up. If she shows signs and symptoms of cancer progression we will move to second line chemo sooner. She agrees. -due to her recent hospital admission, Clement Husbands was held on 08/20/17, for 2 weeks  -we previously discussed that her recent symptoms could also be related to her cancer, and we can consider switching her treatment to chemo, pt was reluctant due to her previous poor tolerance to chemo  -We previously discussed the option of changing Nivolumab to chemo Carbo Taxol or FOLFOX. Patient previously did not tolerate chemotherapy well, is reluctant to start chemotherapy. After a lengthy discussion She will wait to next scan before making a discission on change in treatment.  -Labs reviewed and blood counts are adequate to continue Nivo treatment.  -She will proceed with treatment today and in 2 weeks followed by a repeat scan.  -F/u in 2 weeks  -I encouraged her to be active and try to eat more for her energy.     2.Nausea, anorexia and BM change  -She was recently admitted for N/V, anorexia and fever, ID work up negative, symptoms improved after su[pportive care -I previously recommended her to increase marinol to 10mg  bid -I previously suggested her to try megace for anorexia, she will think about it -I encouraged her to eat more to help her energy. She has not followed up with out dietician recently. I suggest her to f/u with them  -The overall symptoms has improved, however her calorie intake is still low -She has not had BM in 3 days. She will take Miralax today  and if she does not have a BM I suggest she takes Lactulose.   3. Intermittent dyspnea -She reports intermittent dyspnea, especially when she talks and coughs, no orthopnea, no leg swelling, no chest pain or fever, unlikely congestive heart failure or infection -We did a walking test, her oxygen level remains to be 98-100% during walking. This is unlikely PE, she is on Xarelto for her history of DVT. -possible related to cough and or anxiety  -Continue observation  4. Depression/anxiety and insomnia  -I suggest Mirtazapine, but she wishes to wait until after she is weaned off hydrocodone, she has been having a lot of anxiety during the weaning process of narcotics. -For the time being I previously suggest sleep aid like melatonin over-the-counter  - I previously referred her to social worker  5. Left hip pain after hip replacement  -She underwent a total left hip replacement by Dr. Alvan Dame on 03/25/2017 -She has weaned off hydrocodone, on Tylenol No. 3 twice daily previously  -she will continue physical therapy as outpatient, she is overall improving. -I previously suggested the pt reduce Tylenol #3 by half tablets to replace with tramadol, starting with the morning dose.  -She uses tylenol #3 as needed and not much of tramadol since hospitalization.  -I discussed with her many cancer symptoms she is fine to remain on tylenol #3 as needed only. She can still use tramadol more if she would like.   6. Goal of care discussion  -We again discussed the incurable nature of her cancer, and the overall poor prognosis, especially if she does not have good response to chemotherapy or progress on chemo -The patient understands the goal of care is palliative. -I previously recommend DNR/DNI, she will think about it    PLAN:  -Refill Marinol at 10mg  once daily and Tylenol #3 for #40 -Lab, flush, f/u and nivolumab in 2 weeks  -Lab, flush,  f/u and nivolumab in 4 weeks with PET a few days before      Orders Placed This Encounter  Procedures  . NM PET Image Restag (PS) Skull Base To Thigh    Standing Status:   Future    Standing Expiration Date:   09/17/2018    Order Specific Question:   If indicated for the ordered procedure, I authorize the administration of a radiopharmaceutical per Radiology protocol    Answer:   Yes    Order Specific Question:   Is the patient pregnant?    Answer:   No    Order Specific Question:   Preferred imaging location?    Answer:   [facility]    Order Specific Question:   Radiology Contrast Protocol - do NOT remove file path    Answer:   [email or web address]   All questions were answered. The patient knows to call the clinic with any problems, questions or concerns. No barriers to learning was detected.  I spent 25 minutes counseling the patient face to face. The total time spent in the appointment was 30 minutes and more than 50% was on counseling and review of test results  This document serves as a record of services personally performed by Truitt Merle, MD. It was created on her behalf by Joslyn Devon, a trained medical scribe. The creation of this record is based on the scribe's personal observations and the provider's statements to them. This document has been checked and approved by the attending provider.     Truitt Merle  09/17/2017

## 2017-09-17 NOTE — Patient Instructions (Signed)
[facility] Discharge Instructions for Patients Receiving Chemotherapy  Today you received the following chemotherapy agents Opdivo.  To help prevent nausea and vomiting after your treatment, we encourage you to take your nausea medication as directed.   If you develop nausea and vomiting that is not controlled by your nausea medication, call the clinic.   BELOW ARE SYMPTOMS THAT SHOULD BE REPORTED IMMEDIATELY:  *FEVER GREATER THAN 100.5 F  *CHILLS WITH OR WITHOUT FEVER  NAUSEA AND VOMITING THAT IS NOT CONTROLLED WITH YOUR NAUSEA MEDICATION  *UNUSUAL SHORTNESS OF BREATH  *UNUSUAL BRUISING OR BLEEDING  TENDERNESS IN MOUTH AND THROAT WITH OR WITHOUT PRESENCE OF ULCERS  *URINARY PROBLEMS  *BOWEL PROBLEMS  UNUSUAL RASH Items with * indicate a potential emergency and should be followed up as soon as possible.  Feel free to call the clinic you have any questions or concerns. The clinic phone number is [phone].  Please show the CHEMO ALERT CARD at check-in to the Emergency Department and triage nurse.    

## 2017-09-19 ENCOUNTER — Telehealth: Payer: Self-pay | Admitting: Hematology

## 2017-09-19 NOTE — Telephone Encounter (Signed)
Left message for patient regarding upcoming October appointments.  °

## 2017-09-24 ENCOUNTER — Encounter: Payer: Self-pay | Admitting: General Practice

## 2017-09-24 NOTE — Progress Notes (Signed)
[facility]ual Care Note  Checked in briefly with [name] by phone, who was very appreciative of call:  "Are you psychic? I thought about you all day yesterday!" Today wasn't a good time to talk, but we plan to connect in person during her infusion appt on 10/10, and she may phone me before then for a more detailed conversation. Having a theological reflection partner is very important to her as she copes with her situation (disease progression, discomfort, not getting back home to [place], being present and enjoying as much as she can, maintaining perspective, etc).   [place], [place], [facility] Pager [phone] Voicemail [phone]

## 2017-09-25 MED FILL — ACETAMINOPHEN/COD #3 TABLET: 300-30 | 13 days supply | Qty: 40 | Fill #0

## 2017-09-25 MED FILL — DRONABINOL 10 MG CAPS: 10 | 30 days supply | Qty: 60 | Fill #0

## 2017-10-01 ENCOUNTER — Telehealth: Payer: Self-pay | Admitting: Hematology

## 2017-10-01 ENCOUNTER — Other Ambulatory Visit: Payer: Federal, State, Local not specified - PPO

## 2017-10-01 ENCOUNTER — Ambulatory Visit: Payer: Federal, State, Local not specified - PPO | Admitting: Hematology

## 2017-10-01 ENCOUNTER — Encounter: Payer: Self-pay | Admitting: General Practice

## 2017-10-01 ENCOUNTER — Ambulatory Visit: Payer: Federal, State, Local not specified - PPO

## 2017-10-01 NOTE — Progress Notes (Signed)
[facility] Spiritual Care Note  Received phone call from [name] because changes in her tx schedule preclude her meeting in infusion today, as previously planned.  We hope to meet Friday instead.    As always, please also page if immediate needs arise or circumstances change.  Thank you.   [place], [place], [facility] Pager [phone] Voicemail [phone]

## 2017-10-01 NOTE — Telephone Encounter (Signed)
Spoke with patient's daughter - patient has a therapy appointment tomorrow so I emailed Jenny Reichmann to see if we could move her appointments to be on Friday.

## 2017-10-01 NOTE — Telephone Encounter (Signed)
Left message for patient regarding appts added per 10/10 sch msg.

## 2017-10-01 NOTE — Telephone Encounter (Signed)
Spoke with patient's daughter regarding the rescheduling of her appts on 10/10

## 2017-10-02 ENCOUNTER — Ambulatory Visit: Payer: Federal, State, Local not specified - PPO | Admitting: Hematology

## 2017-10-02 ENCOUNTER — Ambulatory Visit: Payer: Federal, State, Local not specified - PPO

## 2017-10-02 ENCOUNTER — Ambulatory Visit (HOSPITAL_COMMUNITY)
Admission: RE | Admit: 2017-10-02 | Discharge: 2017-10-02 | Disposition: A | Discharge status: Home / Self Care | Payer: Federal, State, Local not specified - PPO | Source: Ambulatory Visit | Attending: Hematology | Admitting: Hematology

## 2017-10-02 DIAGNOSIS — C210 Malignant neoplasm of anus, unspecified: Secondary | ICD-10-CM | POA: Diagnosis present

## 2017-10-02 DIAGNOSIS — R591 Generalized enlarged lymph nodes: Secondary | ICD-10-CM | POA: Diagnosis not present

## 2017-10-02 DIAGNOSIS — C787 Secondary malignant neoplasm of liver and intrahepatic bile duct: Secondary | ICD-10-CM | POA: Diagnosis not present

## 2017-10-02 DIAGNOSIS — J383 Other diseases of vocal cords: Secondary | ICD-10-CM | POA: Insufficient documentation

## 2017-10-02 LAB — GLUCOSE, CAPILLARY: GLUCOSE-CAPILLARY: 100 mg/dL — AB (ref 65–99)

## 2017-10-02 MED ORDER — FLUDEOXYGLUCOSE F - 18 (FDG) INJECTION
6.2600 | Freq: Once | INTRAVENOUS | Status: AC | PRN
  Administered 2017-10-02: 6.26 via INTRAVENOUS

## 2017-10-03 ENCOUNTER — Telehealth: Payer: Self-pay

## 2017-10-03 ENCOUNTER — Other Ambulatory Visit (HOSPITAL_BASED_OUTPATIENT_CLINIC_OR_DEPARTMENT_OTHER): Payer: Federal, State, Local not specified - PPO

## 2017-10-03 ENCOUNTER — Encounter: Payer: Self-pay | Admitting: Adult Health

## 2017-10-03 ENCOUNTER — Other Ambulatory Visit: Payer: Self-pay | Admitting: Hematology

## 2017-10-03 ENCOUNTER — Ambulatory Visit (HOSPITAL_BASED_OUTPATIENT_CLINIC_OR_DEPARTMENT_OTHER): Payer: Federal, State, Local not specified - PPO

## 2017-10-03 ENCOUNTER — Ambulatory Visit (HOSPITAL_BASED_OUTPATIENT_CLINIC_OR_DEPARTMENT_OTHER): Payer: Federal, State, Local not specified - PPO | Admitting: Adult Health

## 2017-10-03 ENCOUNTER — Other Ambulatory Visit: Payer: Self-pay

## 2017-10-03 ENCOUNTER — Encounter: Payer: Self-pay | Admitting: General Practice

## 2017-10-03 VITALS — BP 140/72 | HR 78 | Temp 97.3°F | Resp 18 | Ht 63.0 in | Wt 138.0 lb

## 2017-10-03 DIAGNOSIS — R630 Anorexia: Secondary | ICD-10-CM

## 2017-10-03 DIAGNOSIS — C210 Malignant neoplasm of anus, unspecified: Secondary | ICD-10-CM

## 2017-10-03 DIAGNOSIS — C787 Secondary malignant neoplasm of liver and intrahepatic bile duct: Secondary | ICD-10-CM | POA: Diagnosis not present

## 2017-10-03 DIAGNOSIS — Z5112 Encounter for antineoplastic immunotherapy: Secondary | ICD-10-CM

## 2017-10-03 DIAGNOSIS — R110 Nausea: Secondary | ICD-10-CM | POA: Diagnosis not present

## 2017-10-03 DIAGNOSIS — R197 Diarrhea, unspecified: Secondary | ICD-10-CM

## 2017-10-03 DIAGNOSIS — C801 Malignant (primary) neoplasm, unspecified: Secondary | ICD-10-CM

## 2017-10-03 DIAGNOSIS — Z95828 Presence of other vascular implants and grafts: Secondary | ICD-10-CM

## 2017-10-03 LAB — CBC WITH DIFFERENTIAL/PLATELET
BASO%: 0.3 % (ref 0.0–2.0)
BASOS ABS: 0 10*3/uL (ref 0.0–0.1)
EOS ABS: 0.4 10*3/uL (ref 0.0–0.5)
EOS%: 5.8 % (ref 0.0–7.0)
HEMATOCRIT: 36.8 % (ref 34.8–46.6)
HEMOGLOBIN: 12.3 g/dL (ref 11.6–15.9)
LYMPH%: 20.1 % (ref 14.0–49.7)
MCH: 26.5 pg (ref 25.1–34.0)
MCHC: 33.4 g/dL (ref 31.5–36.0)
MCV: 79.3 fL — AB (ref 79.5–101.0)
MONO#: 1 10*3/uL — AB (ref 0.1–0.9)
MONO%: 14.6 % — AB (ref 0.0–14.0)
NEUT%: 59.2 % (ref 38.4–76.8)
NEUTROS ABS: 4 10*3/uL (ref 1.5–6.5)
NRBC: 0 % (ref 0–0)
PLATELETS: 324 10*3/uL (ref 145–400)
RBC: 4.64 10*6/uL (ref 3.70–5.45)
RDW: 16.5 % — AB (ref 11.2–14.5)
WBC: 6.7 10*3/uL (ref 3.9–10.3)
lymph#: 1.4 10*3/uL (ref 0.9–3.3)

## 2017-10-03 LAB — COMPREHENSIVE METABOLIC PANEL
ANION GAP: 8 meq/L (ref 3–11)
AST: 13 U/L (ref 5–34)
Albumin: 3.6 g/dL (ref 3.5–5.0)
Alkaline Phosphatase: 74 U/L (ref 40–150)
BILIRUBIN TOTAL: 0.4 mg/dL (ref 0.20–1.20)
BUN: 5.5 mg/dL — ABNORMAL LOW (ref 7.0–26.0)
CALCIUM: 9 mg/dL (ref 8.4–10.4)
CHLORIDE: 107 meq/L (ref 98–109)
CO2: 27 mEq/L (ref 22–29)
CREATININE: 0.7 mg/dL (ref 0.6–1.1)
Glucose: 82 mg/dl (ref 70–140)
Potassium: 3.5 mEq/L (ref 3.5–5.1)
Sodium: 142 mEq/L (ref 136–145)
TOTAL PROTEIN: 6.9 g/dL (ref 6.4–8.3)

## 2017-10-03 MED ORDER — SODIUM CHLORIDE 0.9% FLUSH
10.0000 mL | INTRAVENOUS | Status: DC | PRN
  Administered 2017-10-03: 10 mL
  Filled 2017-10-03: qty 10

## 2017-10-03 MED ORDER — HEPARIN SOD (PORK) LOCK FLUSH 100 UNIT/ML IV SOLN
500.0000 [IU] | Freq: Once | INTRAVENOUS | Status: AC | PRN
  Administered 2017-10-03: 500 [IU]
  Filled 2017-10-03: qty 5

## 2017-10-03 MED ORDER — SODIUM CHLORIDE 0.9 % IV SOLN
Freq: Once | INTRAVENOUS | Status: AC
  Administered 2017-10-03: 15:00:00 via INTRAVENOUS

## 2017-10-03 MED ORDER — SODIUM CHLORIDE 0.9% FLUSH
10.0000 mL | Freq: Once | INTRAVENOUS | Status: AC
  Administered 2017-10-03: 10 mL
  Filled 2017-10-03: qty 10

## 2017-10-03 MED ORDER — SODIUM CHLORIDE 0.9 % IV SOLN
240.0000 mg | Freq: Once | INTRAVENOUS | Status: AC
  Administered 2017-10-03: 240 mg via INTRAVENOUS
  Filled 2017-10-03: qty 24

## 2017-10-03 NOTE — Telephone Encounter (Signed)
Referral placed for Palliative care services.  Order faxed to [facility].  Lvm on provided number for Palliative care.

## 2017-10-03 NOTE — Assessment & Plan Note (Signed)
Metastatic anal cancer.  Initially diagnosed with rectal cancer followed by radiation with concurrent chemo with 5-FU and Mitomycin.  This was tolerated poorly.  She did have recurrence in 03/2017 followed by Nivolumab therapy.  She has had scans since then, that have showed mixed responses.    [name] had a PET scan that was consistent with disease progression in her liver.  The other areas were stable.  I reviewed this with Dr. Benay Spice today, as Dr. Burr Medico is not here.  He said he would recommend weekly Taxol.  After discussing this with the patient, and the fact that it appears the Nivolumab is not controlling the metastases in the liver, we had a long discussion with [name] present.    1.  She will proceed with Nivolumab today.    2. She may consider Taxol weekly as a different option.  I talked to her briefly about this therapy and side effects, however it is ultimately Dr. Ernestina Penna decision, and she will talk to her about this at her next appointment.  The family had a lot of questions about the progression in the liver and if I could predict how much longer she would have, which I could not answer.   3. She is very concerned about her quality of life, particularly in the future.  I have placed an in home palliative care consult.    [name] will return in 10 days for labs and follow up with Dr. Burr Medico.  At that point she will talk to her about future treatment options.

## 2017-10-03 NOTE — Patient Instructions (Signed)
[facility] Discharge Instructions for Patients Receiving Chemotherapy  Today you received the following chemotherapy agents: Nivolumab  To help prevent nausea and vomiting after your treatment, we encourage you to take your nausea medication as directed.    If you develop nausea and vomiting that is not controlled by your nausea medication, call the clinic.   BELOW ARE SYMPTOMS THAT SHOULD BE REPORTED IMMEDIATELY:  *FEVER GREATER THAN 100.5 F  *CHILLS WITH OR WITHOUT FEVER  NAUSEA AND VOMITING THAT IS NOT CONTROLLED WITH YOUR NAUSEA MEDICATION  *UNUSUAL SHORTNESS OF BREATH  *UNUSUAL BRUISING OR BLEEDING  TENDERNESS IN MOUTH AND THROAT WITH OR WITHOUT PRESENCE OF ULCERS  *URINARY PROBLEMS  *BOWEL PROBLEMS  UNUSUAL RASH Items with * indicate a potential emergency and should be followed up as soon as possible.  Feel free to call the clinic should you have any questions or concerns. The clinic phone number is [phone].  Please show the CHEMO ALERT CARD at check-in to the Emergency Department and triage nurse.   

## 2017-10-03 NOTE — Progress Notes (Signed)
[facility] Follow up:    Lynn Hippo, MD [address] [place] [address]   DIAGNOSIS: Cancer Staging No matching staging information was found for the patient.  SUMMARY OF ONCOLOGIC HISTORY: Oncology History   cT2N2M0 stage IIIB      Anal cancer ([place])   03/09/2015 Initial Diagnosis    Rectal cancer ([place])      04/19/2015 Imaging    PET scan showed hypermetabolic rectal lesion and inguinal lymph nodes      05/08/2015 - 07/13/2015 Radiation Therapy    59 Gy in 35 fractions to pelvis and inguinal LNs for her anal cancer. She tolerated treatment poorly with persistent nausea, vomiting, diarrhea and a poor appetite throughout treatment, and required several hospitalization and treatment delays. She did complete the planned treatment.       05/08/2015 - 06/2015 Chemotherapy    Concurrent chemotherapy with 5-FU and mitomycin along with radiation. Pt tolerated poorly.       01/08/2016 Imaging    PET scan showed you hypermetabolic left mediastinal 1L adenopathy (1.5cm), partial response to therapy with interval decrease in extent of hypermetabolic rectal soft tissue fullness as well as resolution of inguinal adenopathy.      02/06/2017 Procedure    Colonoscopy showed scar tissue just off anal verge, no lesion otherwise.      04/16/2017 - 04/23/2017 Hospital Admission    Admitted 04/16/17 - 04/23/17 for Nausea and Vomiting and abdominal pain. CT abd noted to have findings including rectal mass and enlarged nodes. Patient underwent sigmoidoscopy with biopsy showing benign findings. IR was consulted for node biopsy and which was done and awaiting results.      04/17/2017 Imaging    CT AP W Contrast 04/17/17: IMPRESSION: 1. Left posterior perirectal lobulated mass concerning for rectal malignancy. A smaller nodular density superior to this mass likely represent a mildly enlarged metastatic node. MRI is recommended for further evaluation. 2.  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 A hypodense lesion to the left of the L5 posterior to the psoas muscle is also consistent with metastatic disease. 3. Multiple hepatic hypodense lesions, incompletely characterized, possibly metastatic. MRI is recommended for further evaluation. 4. No evidence of bowel obstruction or active inflammation. Normal appendix.      04/17/2017 Pathology Results    Diagnosis Rectum, biopsy, distal - BENIGN COLONIC MUCOSA WITH EROSIONS AND ULCERATION, SEE COMMENT. - NO DYSPLASIA OR MALIGNANCY.       04/22/2017 Pathology Results    Diagnosis Lymph node, needle/core biopsy, left retroperitoneum periaortic - POORLY DIFFERENTIATED SQUAMOUS CELL CARCINOMA - SEE COMMENT Microscopic Comment The needle core biopsy has a basaloid neoplasm with increased mitoses and necrosis. There is no residual lymph node parenchyma identified. The neoplasm is immunoreactive for cytokeratin 7 (patchy), cytokeratin 5/6, and p16 but negative for CDX2 and cytokeratin 20. Overall, the histology and immunophenotype support the diagnosis of a poorly differentiated squamous cell carcinoma.       05/13/2017 PET scan    IMPRESSION: 1. Perirectal adenopathy and posterior perirectal hypermetabolic mass, associated with presumed metastatic adenopathy in knee left common iliac, retroperitoneal, thoracic paraesophageal, and left upper mediastinal regions. The left upper mediastinal mass extends into the left supraclavicular space. 2. Metastatic disease to the liver (best seen in segment 3) along with a hypermetabolic nodule compatible with metastatic disease in the superior segment of the lower lobe of the left lung.      05/16/2017 -  Antibody Plan    Nivolumab 22m every 2 weeks started on 05/16/17, held on 08/20/17 due to poor toleration. Restart nivolamab 09/03/17      08/01/2017 PET scan    PET 08/01/17 IMPRESSION: 1.  Mixed appearance of response, with significant improvement in the thoracic metastatic disease ; mild improvement in the abdominal adenopathy ; new and worsening metastatic disease in the liver; and a relatively stable left posterior perirectal mass. 2. Other imaging findings of potential clinical significance: Aortic Atherosclerosis (ICD10-I70.0). Prominent stool throughout the colon favors constipation.       08/13/2017 Imaging    CT Abdomen/Pelvis w contrast IMPRESSION: Stable rectal wall thickening and posterior rectal wall mass compatible with patient's history of rectal cancer.  Stable hepatic metastases and bulky retroperitoneal metastases.  No evidence of bowel obstruction.      08/14/2017 -  Hospital Admission    presented to the ED yesterday with constipation, nausea, abdominal discomfort and vomiting episodes       CURRENT THERAPY: Nivolumab every two weeks  INTERVAL HISTORY: [name] 61y.o. female returns for evaluation prior to receiving Nivolumab today for her metastatic anal cancer.  She has been tolerating Nivolumab well.  She had PET yesterday which showed stable supraclavicular mass, stable presacral/perirectal mass, but about a 1cm to 0.5cm progression in her liver lesions and a new 168mliver lesion.  She is doing well today, and takes her pain medication when prescribed, about one half tablet twice per day.  She does have decreased appetite and is up to 1084mf Marinol.  She is eating about 300-500 calories per day, but is drinking plenty of water.  She has met with nutrition.  She cannot drink ensure, she doesn't like it.  She does have some bloating, rumbling, occasional nausea, and diarrhea versus constipation issues that she has dealt with.     Patient Active Problem List   Diagnosis Date Noted  . Fever 08/14/2017  . SIRS (systemic inflammatory response syndrome) ([facility]8/23/2018  . Port catheter in place 05/23/2017  . 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 Goals of care, counseling/discussion 05/01/2017  . Cancer ([facility] . Nausea & vomiting 04/17/2017  . Abdominal pain 04/17/2017  . Anal cancer ([facility]4/26/2018  . Rectal bleeding 04/17/2017  . Acute encephalopathy 04/17/2017  . DVT (deep venous thrombosis) ([facility] . Asthma   . GERD (gastroesophageal reflux disease)   . S/P left THA, AA 03/25/2017    is allergic to ativan [lorazepam] and penicillins.  MEDICAL HISTORY: Past Medical History:  Diagnosis Date  . Anxiety   . Arthritis   . Asthma    childhood   . [facility]  col[facility] cancer with chemo and radiation   . DVT (deep venous thrombosis) (HCC)    left lower extremity  . Dyspnea    with anxiety and exertion  . GERD (gastroesophageal reflux disease)   . Neuromuscular disorder (HCC)    poly neuropathy   . [facility]   TIA  x2    SURGICAL HISTORY: Past Surgical History:  Procedure Laterality Date  . CESAREAN SECTION    . FLEXIBLE SIGMOIDOSCOPY N/A 04/17/2017   Procedure: FLEXIBLE SIGMOIDOSCOPY;  Surgeon: RobRonald LoboD;  Location: [facility] ENDOSCOPY;  Service: Endoscopy;  Laterality: N/A;  . IR FLUORO GUIDE PORT INSERTION LEFT  06/04/2017  . IR REMOVAL TUN ACCESS W/ PORT W/O FL MOD SED  06/04/2017  . IR US [place]IDE VASC ACCESS LEFT  06/04/2017  . IR VENO/EXT/UNI LEFT  06/04/2017  . THYROID CYST EXCISION    . TOTAL HIP ARTHROPLASTY Left 03/25/2017   Procedure: LEFT TOTAL HIP ARTHROPLASTY ANTERIOR APPROACH;  Surgeon: Paralee Cancel, MD;  Location: [facility] ORS;  Service: Orthopedics;  Laterality: Left;  Requests 70 mins    SOCIAL HISTORY: Social History   Social History  . Marital status: Divorced    Spouse name: N/A  . Number of children: N/A  . Years of education: N/A   Occupational History  . Not on file.   Social History Main Topics  . Smoking status: Former Smoker    Types: Cigarettes  . Smokeless tobacco: Never Used     Comment: 20 years ago   . Alcohol use No  . Drug use: No   . Sexual activity: Not on file   Other Topics Concern  . Not on file   Social History Narrative  . No narrative on file    FAMILY HISTORY: Family History  Problem Relation Age of Onset  . Hypertension Mother   . Dementia Mother     Review of Systems  Constitutional: Positive for appetite change. Negative for chills, diaphoresis, fatigue, fever and unexpected weight change.  HENT:   Negative for hearing loss and lump/mass.   Eyes: Negative for eye problems and icterus.  Respiratory: Negative for chest tightness, cough and shortness of breath.   Cardiovascular: Negative for chest pain, leg swelling and palpitations.  Gastrointestinal: Negative for abdominal distention and abdominal pain.  Endocrine: Negative for hot flashes.  Genitourinary: Negative for difficulty urinating.   Skin: Negative for itching and rash.  Neurological: Positive for numbness (chronic in her toes). Negative for dizziness and extremity weakness.  Hematological: Negative for adenopathy. Does not bruise/bleed easily.  Psychiatric/Behavioral: Negative for depression. The patient is not nervous/anxious.       PHYSICAL EXAMINATION  ECOG PERFORMANCE STATUS: 2 - Symptomatic, <50% confined to bed  Vitals:   10/03/17 1250  BP: 140/72  Pulse: 78  Resp: 18  Temp: (!) 97.3 F (36.3 C)  SpO2: 97%    Physical Exam  Constitutional: She is oriented to person, place, and time and well-developed, well-nourished, and in no distress.  Sitting in wheelchair  HENT:  Head: Normocephalic and atraumatic.  Right Ear: External ear normal.  Left Ear: External ear normal.  Nose: Nose normal.  Mouth/Throat: Oropharynx is clear and moist. No oropharyngeal exudate.  Eyes: Pupils are equal, round, and reactive to light. No scleral icterus.  Neck: Neck supple.  Cardiovascular: Normal rate, regular rhythm and normal heart sounds.   Pulmonary/Chest: Effort normal and breath sounds normal. No respiratory distress. She has  no wheezes. She has no rales.  Abdominal: Soft. Bowel sounds are normal. She exhibits no distension. There is no tenderness.  Musculoskeletal: She exhibits no edema.  Lymphadenopathy:    She has no cervical adenopathy.  Neurological: She is alert and oriented to person, place, and time.  Skin: Skin is warm and dry.  Psychiatric: Mood and affect normal.    LABORATORY DATA:  CBC    Component Value Date/Time   WBC 6.7 10/03/2017 1229   WBC 7.0 08/18/2017 0500   RBC 4.64 10/03/2017 1229   RBC 4.62 08/18/2017 0500   HGB 12.3 10/03/2017 1229   HCT 36.8 10/03/2017 1229   PLT 324 10/03/2017 1229   MCV 79.3 (L) 10/03/2017 1229   MCH 26.5 10/03/2017 1229   MCH 26.2 08/18/2017 0500   MCHC 33.4 10/03/2017 1229  MCHC 34.0 08/18/2017 0500   RDW 16.5 (H) 10/03/2017 1229   LYMPHSABS 1.4 10/03/2017 1229   MONOABS 1.0 (H) 10/03/2017 1229   EOSABS 0.4 10/03/2017 1229   BASOSABS 0.0 10/03/2017 1229    CMP     Component Value Date/Time   NA 142 10/03/2017 1229   K 3.5 10/03/2017 1229   CL 110 08/18/2017 0500   CO2 27 10/03/2017 1229   GLUCOSE 82 10/03/2017 1229   BUN 5.5 (L) 10/03/2017 1229   CREATININE 0.7 10/03/2017 1229   CALCIUM 9.0 10/03/2017 1229   PROT 6.9 10/03/2017 1229   ALBUMIN 3.6 10/03/2017 1229   AST 13 10/03/2017 1229   ALT <6 10/03/2017 1229   ALKPHOS 74 10/03/2017 1229   BILITOT 0.40 10/03/2017 1229   GFRNONAA >60 08/18/2017 0500   GFRAA >60 08/18/2017 0500       RADIOGRAPHIC STUDIES:  Nm Pet Image Restag (ps) Skull Base To Thigh  Result Date: 10/02/2017 CLINICAL DATA:  Subsequent Treatment strategy for anal cancer. EXAM: NUCLEAR MEDICINE PET SKULL BASE TO THIGH TECHNIQUE: 6.26 mCi F-18 FDG was injected intravenously. Full-ring PET imaging was performed from the skull base to thigh after the radiotracer. CT data was obtained and used for attenuation correction and anatomic localization. FASTING BLOOD GLUCOSE:  Value: 100 mg/dl COMPARISON:  PET-CT 08/01/2017  and 05/13/2017 FINDINGS: NECK: Focal area of FDG uptake is noted in the base of the right vocal cord. The left vocal cord appears buckled and may be paralyzed. No lesion is seen. Supraclavicular and subclavicular nodal mass on the left side measures approximately 3.4 x 2.8 cm on image number 38 and previously measured 3.4 x 2.5 cm. SUV max is 7.1 and was previously 6.1. CHEST: The small mediastinal lymph node near the esophagus and left mainstem bronchus is no longer identified. No mediastinal or hilar lymphadenopathy or hypermetabolism. No findings for metastatic pulmonary disease. Caps ABDOMEN/PELVIS: Interval progression of hepatic metastatic disease. Segment 3 liver lesion on image number 93 measures 4.4 x 3.6 cm and previously measured 3.1 x 2.6 cm. SUV max is 9.0 and was previously 5.8. Segment 8 lesion on image number 81 measures 19.5 x 18.5 mm and was previously 12.5 x 12 mm. SUV max is 6.1 and was previously 3.4. New 10 mm lesion in segment 4A on image number 78 has an SUV max of 4.7. Bulky retroperitoneal adenopathy appears stable. The left para-aortic nodal mass on image number 119 measures 4.0 x 3.4 cm and previously measured 4.1 x 3.5 cm. SUV max is 10.9 and was previously 10.4. Large presacral/ perirectal mass appears stable in size measuring 3.8 x 3.1 cm on image number 150. It previously measured approximately 3.7 x 2.3 cm. SUV max is 11.4 and was previously 9.0. Stable left-sided hydroureteronephrosis. SKELETON: No focal hypermetabolic activity to suggest skeletal metastasis. IMPRESSION: 1. Overall stable appearing left-sided supraclavicular and subclavicular nodal mass and retroperitoneal lymphadenopathy. 2. Progressive metastatic liver disease. 3. Stable appearing presacral/perirectal mass. 4. FDG activity in the base of the right vocal cord may be due to left-sided vocal cord paralysis. Electronically Signed   By: Marijo Sanes M.D.   On: 10/02/2017 11:20      ASSESSMENT and PLAN:   Anal  cancer ([place]) Metastatic anal cancer.  Initially diagnosed with rectal cancer followed by radiation with concurrent chemo with 5-FU and Mitomycin.  This was tolerated poorly.  She did have recurrence in 03/2017 followed by Nivolumab therapy.  She has had scans since then, that have  showed mixed responses.    Ms. Danton Sewer had a PET scan that was consistent with disease progression in her liver.  The other areas were stable.  I reviewed this with Dr. Benay Spice today, as Dr. Burr Medico is not here.  He said he would recommend weekly Taxol.  After discussing this with the patient, and the fact that it appears the Nivolumab is not controlling the metastases in the liver, we had a long discussion with [name] present.    1.  She will proceed with Nivolumab today.    2. She may consider Taxol weekly as a different option.  I talked to her briefly about this therapy and side effects, however it is ultimately Dr. Ernestina Penna decision, and she will talk to her about this at her next appointment.  The family had a lot of questions about the progression in the liver and if I could predict how much longer she would have, which I could not answer.   3. She is very concerned about her quality of life, particularly in the future.  I have placed an in home palliative care consult.    Ms. Danton Sewer will return in 10 days for labs and follow up with Dr. Burr Medico.  At that point she will talk to her about future treatment options.      All questions were answered. The patient knows to call the clinic with any problems, questions or concerns. We can certainly see the patient much sooner if necessary.  A total of (30) minutes of face-to-face time was spent with this patient with greater than 50% of that time in counseling and care-coordination.  This note was electronically signed. Scot Dock, NP 10/03/2017

## 2017-10-03 NOTE — Progress Notes (Signed)
[facility] Spiritual Care Note  Spent >2h with Steph (ca 45 min also with dtr [name]) because she needed to conduct deep life review as she struggles with and processes big themes such as forgiveness and her theological understanding thereof.  Served as a spiritual friend and witness to her pain, hopes, laments, and longing.  Malena Edman her a book of mutual spiritual interest ("[place]" by Dell Ponto) and gave her "magic" dissolving paper to use in personal reflective ritual (expressing/releasing).  We plan to f/u by phone and when she is next on campus.   [place], [place], [facility] Pager [phone] Voicemail [phone]

## 2017-10-03 NOTE — Patient Instructions (Signed)

## 2017-10-06 ENCOUNTER — Telehealth: Payer: Self-pay

## 2017-10-06 NOTE — Telephone Encounter (Signed)
No los per 10/12 los

## 2017-10-07 ENCOUNTER — Telehealth: Payer: Self-pay | Admitting: *Deleted

## 2017-10-07 NOTE — Telephone Encounter (Signed)
"  Gonzella Lex RN with [facility] [phone]), requesting Remeron order for t his patient.  She is on Marinol but having symptoms of a poor appetite.  She also is down or sad from her diagnosis.  If Dr. Burr Medico or Regan Rakers do not object, Remeron will help with her mood and appetite.  Secondly is she appropriate for hospice more so than Palliative Care?  Hospice can be discussed with her visit next week.  Call me with any orders."  Routing call information to providers for review and any further communication.

## 2017-10-08 ENCOUNTER — Telehealth: Payer: Self-pay | Admitting: Adult Health

## 2017-10-08 DIAGNOSIS — F320 Major depressive disorder, single episode, mild: Secondary | ICD-10-CM

## 2017-10-08 DIAGNOSIS — F32A Depression, unspecified: Secondary | ICD-10-CM

## 2017-10-08 MED ORDER — MIRTAZAPINE 7.5 MG PO TABS: 7.5000 mg | ORAL_TABLET | Freq: Every day | ORAL | 0 refills | Status: DC

## 2017-10-08 NOTE — Telephone Encounter (Signed)
See 10-08-2017 A.P.P. note.

## 2017-10-08 NOTE — Telephone Encounter (Signed)
Received message that Lynn Lex NP with [facility] wanted to speak with me.  She evidently called at 1600 on 10/07/2017 and left a message with Thompson Caul, LPN.  Her phone number: [phone].  [name] tells me that she saw [name] yesterday.  There are concerns with decreased appetite.  She is taking Marinol, but her appetite continues to be suppressed.  [name] suggested Remeron, which I think is an excellent idea.  I will send in a script for this to [facility].  She will meet with a grief counselor on Friday.  [name] has not yet made a final decision about chemotherapy and whether or not she will agree to it.  She is very concerned about her quality of life.    [name] for her care and consultation of this patient.    Wilber Bihari, NP

## 2017-10-15 ENCOUNTER — Telehealth: Payer: Self-pay

## 2017-10-15 ENCOUNTER — Ambulatory Visit: Payer: Federal, State, Local not specified - PPO

## 2017-10-15 ENCOUNTER — Ambulatory Visit (HOSPITAL_BASED_OUTPATIENT_CLINIC_OR_DEPARTMENT_OTHER): Payer: Federal, State, Local not specified - PPO | Admitting: Hematology

## 2017-10-15 ENCOUNTER — Ambulatory Visit (HOSPITAL_BASED_OUTPATIENT_CLINIC_OR_DEPARTMENT_OTHER): Payer: Federal, State, Local not specified - PPO

## 2017-10-15 ENCOUNTER — Other Ambulatory Visit (HOSPITAL_BASED_OUTPATIENT_CLINIC_OR_DEPARTMENT_OTHER): Payer: Federal, State, Local not specified - PPO

## 2017-10-15 VITALS — BP 171/81 | HR 76 | Temp 98.9°F | Resp 18 | Ht 63.0 in

## 2017-10-15 VITALS — BP 156/77

## 2017-10-15 DIAGNOSIS — R110 Nausea: Secondary | ICD-10-CM

## 2017-10-15 DIAGNOSIS — C778 Secondary and unspecified malignant neoplasm of lymph nodes of multiple regions: Secondary | ICD-10-CM

## 2017-10-15 DIAGNOSIS — F418 Other specified anxiety disorders: Secondary | ICD-10-CM | POA: Diagnosis not present

## 2017-10-15 DIAGNOSIS — G4700 Insomnia, unspecified: Secondary | ICD-10-CM | POA: Diagnosis not present

## 2017-10-15 DIAGNOSIS — C210 Malignant neoplasm of anus, unspecified: Secondary | ICD-10-CM | POA: Diagnosis not present

## 2017-10-15 DIAGNOSIS — I82409 Acute embolism and thrombosis of unspecified deep veins of unspecified lower extremity: Secondary | ICD-10-CM

## 2017-10-15 DIAGNOSIS — Z95828 Presence of other vascular implants and grafts: Secondary | ICD-10-CM

## 2017-10-15 DIAGNOSIS — R630 Anorexia: Secondary | ICD-10-CM | POA: Diagnosis not present

## 2017-10-15 DIAGNOSIS — C801 Malignant (primary) neoplasm, unspecified: Secondary | ICD-10-CM

## 2017-10-15 DIAGNOSIS — C787 Secondary malignant neoplasm of liver and intrahepatic bile duct: Secondary | ICD-10-CM | POA: Diagnosis not present

## 2017-10-15 DIAGNOSIS — R112 Nausea with vomiting, unspecified: Secondary | ICD-10-CM

## 2017-10-15 DIAGNOSIS — Z7189 Other specified counseling: Secondary | ICD-10-CM | POA: Diagnosis not present

## 2017-10-15 LAB — COMPREHENSIVE METABOLIC PANEL
ALT: <6 U/L (ref 0–55)
ANION GAP: 10 meq/L (ref 3–11)
AST: 17 U/L (ref 5–34)
Albumin: 3.8 g/dL (ref 3.5–5.0)
Alkaline Phosphatase: 75 U/L (ref 40–150)
BILIRUBIN TOTAL: 0.52 mg/dL (ref 0.20–1.20)
BUN: 5.9 mg/dL — AB (ref 7.0–26.0)
CHLORIDE: 106 meq/L (ref 98–109)
CO2: 26 mEq/L (ref 22–29)
Calcium: 9.3 mg/dL (ref 8.4–10.4)
Creatinine: 0.7 mg/dL (ref 0.6–1.1)
Glucose: 92 mg/dl (ref 70–140)
POTASSIUM: 3.4 meq/L — AB (ref 3.5–5.1)
SODIUM: 142 meq/L (ref 136–145)
TOTAL PROTEIN: 7.2 g/dL (ref 6.4–8.3)

## 2017-10-15 LAB — CBC WITH DIFFERENTIAL/PLATELET
BASO%: 0.5 % (ref 0.0–2.0)
BASOS ABS: 0 10*3/uL (ref 0.0–0.1)
EOS ABS: 0.3 10*3/uL (ref 0.0–0.5)
EOS%: 5.2 % (ref 0.0–7.0)
HCT: 38.8 % (ref 34.8–46.6)
HGB: 12.7 g/dL (ref 11.6–15.9)
LYMPH%: 19.5 % (ref 14.0–49.7)
MCH: 26.6 pg (ref 25.1–34.0)
MCHC: 32.7 g/dL (ref 31.5–36.0)
MCV: 81.2 fL (ref 79.5–101.0)
MONO#: 0.8 10*3/uL (ref 0.1–0.9)
MONO%: 12.4 % (ref 0.0–14.0)
NEUT%: 62.4 % (ref 38.4–76.8)
NEUTROS ABS: 4.1 10*3/uL (ref 1.5–6.5)
PLATELETS: 375 10*3/uL (ref 145–400)
RBC: 4.78 10*6/uL (ref 3.70–5.45)
RDW: 16.7 % — ABNORMAL HIGH (ref 11.2–14.5)
WBC: 6.5 10*3/uL (ref 3.9–10.3)
lymph#: 1.3 10*3/uL (ref 0.9–3.3)

## 2017-10-15 LAB — TSH: TSH: 1.183 m(IU)/L (ref 0.308–3.960)

## 2017-10-15 MED ORDER — SODIUM CHLORIDE 0.9% FLUSH
10.0000 mL | Freq: Once | INTRAVENOUS | Status: AC
  Administered 2017-10-15: 10 mL
  Filled 2017-10-15: qty 10

## 2017-10-15 MED ORDER — SODIUM CHLORIDE 0.9% FLUSH
10.0000 mL | INTRAVENOUS | Status: DC | PRN
  Administered 2017-10-15: 10 mL
  Filled 2017-10-15: qty 10

## 2017-10-15 MED ORDER — SODIUM CHLORIDE 0.9 % IV SOLN
Freq: Once | INTRAVENOUS | Status: AC
  Administered 2017-10-15: 16:00:00 via INTRAVENOUS

## 2017-10-15 MED ORDER — DEXAMETHASONE SODIUM PHOSPHATE 10 MG/ML IJ SOLN
INTRAMUSCULAR | Status: AC
  Filled 2017-10-15: qty 1

## 2017-10-15 MED ORDER — SODIUM CHLORIDE 0.9 % IV SOLN: Freq: Once | INTRAVENOUS | Status: DC

## 2017-10-15 MED ORDER — HEPARIN SOD (PORK) LOCK FLUSH 100 UNIT/ML IV SOLN
500.0000 [IU] | Freq: Once | INTRAVENOUS | Status: AC | PRN
  Administered 2017-10-15: 500 [IU]
  Filled 2017-10-15: qty 5

## 2017-10-15 MED ORDER — ONDANSETRON HCL 4 MG/2ML IJ SOLN
8.0000 mg | Freq: Once | INTRAMUSCULAR | Status: AC
  Administered 2017-10-15: 8 mg via INTRAVENOUS

## 2017-10-15 MED ORDER — ONDANSETRON HCL 4 MG/2ML IJ SOLN
INTRAMUSCULAR | Status: AC
  Filled 2017-10-15: qty 4

## 2017-10-15 MED ORDER — DEXAMETHASONE SODIUM PHOSPHATE 10 MG/ML IJ SOLN
10.0000 mg | Freq: Once | INTRAMUSCULAR | Status: AC
  Administered 2017-10-15: 10 mg via INTRAVENOUS

## 2017-10-15 MED ORDER — DEXAMETHASONE 4 MG PO TABS: 4.0000 mg | ORAL_TABLET | Freq: Every day | ORAL | 0 refills | Status: AC

## 2017-10-15 NOTE — Progress Notes (Signed)
[name]  Telephone:([phone] Fax:([phone]) [phone]  Clinic Follow up Note   Patient Care Team: Damaris Hippo, MD as PCP - General (Family Medicine) 10/15/2017   CHIEF COMPLAINS f/u metastatic anal cancer  SUMMARY OF ONCOLOGIC HISTORY: Oncology History   [id number] stage IIIB      Anal cancer ([facility])   03/09/2015 Initial Diagnosis    Rectal cancer ([facility])      04/19/2015 Imaging    PET scan showed hypermetabolic rectal lesion and inguinal lymph nodes      05/08/2015 - 07/13/2015 Radiation Therapy    59 Gy in 35 fractions to pelvis and inguinal LNs for her anal cancer. She tolerated treatment poorly with persistent nausea, vomiting, diarrhea and a poor appetite throughout treatment, and required several hospitalization and treatment delays. She did complete the planned treatment.       05/08/2015 - 06/2015 Chemotherapy    Concurrent chemotherapy with 5-FU and mitomycin along with radiation. Pt tolerated poorly.       01/08/2016 Imaging    PET scan showed you hypermetabolic left mediastinal 1L adenopathy (1.5cm), partial response to therapy with interval decrease in extent of hypermetabolic rectal soft tissue fullness as well as resolution of inguinal adenopathy.      02/06/2017 Procedure    Colonoscopy showed scar tissue just off anal verge, no lesion otherwise.      04/16/2017 - 04/23/2017 Hospital Admission    Admitted 04/16/17 - 04/23/17 for Nausea and Vomiting and abdominal pain. CT abd noted to have findings including rectal mass and enlarged nodes. Patient underwent sigmoidoscopy with biopsy showing benign findings. IR was consulted for node biopsy and which was done and awaiting results.      04/17/2017 Imaging    CT AP W Contrast 04/17/17: IMPRESSION: 1. Left posterior perirectal lobulated mass concerning for rectal malignancy. A smaller nodular density superior to this mass likely represent a mildly enlarged metastatic node. MRI is recommended for further  evaluation. 2. 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 A hypodense lesion to the left of the L5 posterior to the psoas muscle is also consistent with metastatic disease. 3. Multiple hepatic hypodense lesions, incompletely characterized, possibly metastatic. MRI is recommended for further evaluation. 4. No evidence of bowel obstruction or active inflammation. Normal appendix.      04/17/2017 Pathology Results    Diagnosis Rectum, biopsy, distal - BENIGN COLONIC MUCOSA WITH EROSIONS AND ULCERATION, SEE COMMENT. - NO DYSPLASIA OR MALIGNANCY.       04/22/2017 Pathology Results    Diagnosis Lymph node, needle/core biopsy, left retroperitoneum periaortic - POORLY DIFFERENTIATED SQUAMOUS CELL CARCINOMA - SEE COMMENT Microscopic Comment The needle core biopsy has a basaloid neoplasm with increased mitoses and necrosis. There is no residual lymph node parenchyma identified. The neoplasm is immunoreactive for cytokeratin 7 (patchy), cytokeratin 5/6, and p16 but negative for CDX2 and cytokeratin 20. Overall, the histology and immunophenotype support the diagnosis of a poorly differentiated squamous cell carcinoma.       05/13/2017 PET scan    IMPRESSION: 1. Perirectal adenopathy and posterior perirectal hypermetabolic mass, associated with presumed metastatic adenopathy in knee left common iliac, retroperitoneal, thoracic paraesophageal, and left upper mediastinal regions. The left upper mediastinal mass extends into the left supraclavicular space. 2. Metastatic disease to the liver (best seen in segment 3) along with a hypermetabolic nodule compatible with metastatic disease in the superior segment of the lower lobe of the left lung.      05/16/2017 -  10/15/2017 Antibody Plan    Nivolumab 240mg  every 2 weeks started on 05/16/17, held on 08/20/17 due to poor toleration. Restart nivolamab 09/03/17. Stopped on 10/15/17 due to disease  progression      08/01/2017 PET scan    PET 08/01/17 IMPRESSION: 1. Mixed appearance of response, with significant improvement in the thoracic metastatic disease ; mild improvement in the abdominal adenopathy ; new and worsening metastatic disease in the liver; and a relatively stable left posterior perirectal mass. 2. Other imaging findings of potential clinical significance: Aortic Atherosclerosis (ICD10-I70.0). Prominent stool throughout the colon favors constipation.       08/13/2017 Imaging    CT Abdomen/Pelvis w contrast IMPRESSION: Stable rectal wall thickening and posterior rectal wall mass compatible with patient's history of rectal cancer.  Stable hepatic metastases and bulky retroperitoneal metastases.  No evidence of bowel obstruction.      08/14/2017 -  Hospital Admission    presented to the ED yesterday with constipation, nausea, abdominal discomfort and vomiting episodes      10/02/2017 PET scan    PET 10/02/17 IMPRESSION: 1. Overall stable appearing left-sided supraclavicular and subclavicular nodal mass and retroperitoneal lymphadenopathy. 2. Progressive metastatic liver disease. 3. Stable appearing presacral/perirectal mass. 4. FDG activity in the base of the right vocal cord may be due to left-sided vocal cord paralysis.      History of Present Illness (04/17/17) [name] is a 61 y.o. 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 I was called to see her in hospital.   She is from [name], with past medical history of DVT on Xarelto, rectal cancer status post chemotherapy and radiation in 2016, stroke, asthma, who was admitted to hospital last night for nausea, vomiting and abdominal pain. CT of abdomen and pelvis showed left posterior perirectal lobulated mass concerning for rectum malignancy. Retroperitoneal adenopathy, I hypodense lesion to the left L5 to the sore S muscle, suspicious  for metastasis, and multiple hepatic hypodense lesions, indeterminate. I was called to see the patient to ruled out metastatic rectal cancer recurrence. I saw the patient before her endoscopy today.  The patient reports she has a prior history of rectal cancer stage 3, squamous cell, which was treated with chemotherapy and radiation therapy. She did not have surgery. The patient reports she completed treatment in September 2016. The patient has not used her port since 2016, though it is still in place. She reports the last time she followed up with physicians for her prior cancer, they told her she had suspicious masses in her lungs and liver.  The patient does not have any medical records from [name], where she previously lived and was treated, but reports her daughter would have them. She reports she does not know if she plans to stay in [place], or if she will return to [name].    Current Treatment: transition to [place]:  Tamiyah Moulin is here for a follow up. She presents to the clinic today with her home nurse and called her daughter over the phone.  She notes she feels very week and she cannot bring up her appetite so she does not eat enough. She still takes antinausea medication Reglan and Dissolvable Zofran and is willing to try that before eating. She has not thrown up. She has lost 6 pounds in 2 weeks. She does not like ensure boost. Her home nurse offered to mix it with a shake to  help. She has tried Mirtazapine since 6 days ago. She takes Marinol but she feels this does not work enough for her.  She is willing to try Meggies  She notes to feeling very fatigued and like a "zombie". She wonders if this is from her cancer or her medication. She reports to feeling bloated as well. She is not constipated but she feels blocked in her bowel, she has to manually remove her stool several times a day.  She notes having lesion on her lower right abdomen.  This does not itch and it not an insect bite.     REVIEW OF SYSTEMS:   Constitutional: Denies abnormal weight loss (+) still overall fatigued (+) weight loss (+) low appetite (+) chemo brain "fuzzy" Eyes: Denies blurriness of vision Ears, nose, mouth, throat, and face: Denies mucositis or sore throat  (+) voice change Respiratory: negative Cardiovascular: Denies palpitation, chest discomfort or lower extremity swelling Gastrointestinal:  Denies heartburn  (+) nausea  (+) Low amounts of BM daily (+) bloating  Skin: Denies abnormal skin rashes (+) small lesions on lower right stomach  Lymphatics: Denies new lymphadenopathy or easy bruising Neurological:Denies numbness, tingling or new weaknesses MSK: (+) occasional  hip pain from surgery (+) pain in rectal area when sitting for extended periods of time Behavioral/Psych: Mood is stable, no new changes  All other systems were reviewed with the patient and are negative.  MEDICAL HISTORY:  Past Medical History:  Diagnosis Date  . Anxiety   . Arthritis   . Asthma    childhood   . Cancer [facility])    colorectal cancer with chemo and radiation   . DVT (deep venous thrombosis) ([facility])    left lower extremity  . Dyspnea    with anxiety and exertion  . GERD (gastroesophageal reflux disease)   . Neuromuscular disorder ([facility])    poly neuropathy   . Stroke [facility]) 2010, 2011   TIA  x2    SURGICAL HISTORY: Past Surgical History:  Procedure Laterality Date  . CESAREAN SECTION    . FLEXIBLE SIGMOIDOSCOPY N/A 04/17/2017   Procedure: FLEXIBLE SIGMOIDOSCOPY;  Surgeon: Ronald Lobo, MD;  Location: [facility]COPY;  Service: Endoscopy;  Laterality: N/A;  . IR FLUORO GUIDE PORT INSERTION LEFT  06/04/2017  . IR REMOVAL TUN ACCESS W/ PORT W/O FL MOD SED  06/04/2017  . IR US GUIDE VASC ACCESS LEFT  06/04/2017  . IR VENO/EXT/UNI LEFT  06/04/2017  . THYROID CYST EXCISION    . TOTAL HIP ARTHROPLASTY Left 03/25/2017   Procedure: LEFT TOTAL HIP ARTHROPLASTY  ANTERIOR APPROACH;  Surgeon: Paralee Cancel, MD;  Location: [facility] ORS;  Service: Orthopedics;  Laterality: Left;  Requests 70 mins    I have reviewed the social history and family history with the patient and they are unchanged from previous note.  ALLERGIES:  is allergic to ativan [lorazepam] and penicillins.  MEDICATIONS:  Current Outpatient Prescriptions  Medication Sig Dispense Refill  . acetaminophen-codeine (TYLENOL #3) 300-30 MG tablet Take 1 tablet by mouth every 8 (eight) hours as needed. 40 tablet 0  . dexamethasone (DECADRON) 4 MG tablet Take 1 tablet (4 mg total) by mouth daily. 15 tablet 0  . docusate sodium (COLACE) 100 MG capsule Take 1 capsule (100 mg total) by mouth 2 (two) times daily. 10 capsule 0  . dronabinol (MARINOL) 10 MG capsule Take 10 mg by mouth 2 (two) times daily.  0  . lidocaine-prilocaine (EMLA) cream Apply 1 application topically as needed. (Patient taking  differently: Apply 1 application topically as needed (for port). ) 30 g 6  . methocarbamol (ROBAXIN) 500 MG tablet Take 1 tablet (500 mg total) by mouth every 6 (six) hours as needed for muscle spasms. (Patient taking differently: Take 500 mg by mouth every 6 (six) hours as needed for muscle spasms. Taking BID) 40 tablet 0  . metoCLOPramide (REGLAN) 10 MG tablet Take 1 tablet (10 mg total) by mouth 4 (four) times daily -  before meals and at bedtime. 120 tablet 2  . mirtazapine (REMERON) 7.5 MG tablet Take 1 tablet (7.5 mg total) by mouth at bedtime. 30 tablet 0  . Multiple Vitamins-Minerals (CENTRUM ADULTS) TABS Take 1 tablet by mouth daily.    . ondansetron (ZOFRAN ODT) 4 MG disintegrating tablet Take 1 tablet (4 mg total) by mouth every 8 (eight) hours as needed for nausea or vomiting. 12 tablet 1  . polyethylene glycol (MIRALAX / GLYCOLAX) packet Take 17 g by mouth 2 (two) times daily. 14 each 0  . rivaroxaban (XARELTO) 20 MG TABS tablet Take 1 tablet (20 mg total) by mouth daily with supper. Please follow up  with PCP to continue this medication. 30 tablet 0  . saccharomyces boulardii (FLORASTOR) 250 MG capsule Take 250 mg by mouth daily.    . traMADol (ULTRAM) 50 MG tablet Take 50 mg by mouth every 6 (six) hours as needed.     No current facility-administered medications for this visit.     PHYSICAL EXAMINATION:  ECOG PERFORMANCE STATUS: 3 Vitals:   10/15/17 1450  BP: (!) 171/81  Pulse: 76  Resp: 18  Temp: 98.9 F (37.2 C)  TempSrc: Oral  Height: 5\' 3"  (1.6 m)    GENERAL:alert, not in distress, came in wheelchair  SKIN: skin color, texture, turgor are normal, no rashes or significant lesions  EYES: normal, Conjunctiva are pink and non-injected, sclera clear OROPHARYNX:no exudate, no erythema and lips, buccal mucosa, and tongue normal  NECK: supple, thyroid normal size, non-tender, without nodularity LYMPH:  no palpable lymphadenopathy in the cervical, axillary or inguinal LUNGS: clear to auscultation and percussion with normal breathing effort HEART: regular rate & rhythm and no murmurs and no lower extremity edema ABDOMEN:abdomen soft, mild diffuse tenderness (+)  bowel sounds moving slow  Musculoskeletal:no cyanosis of digits and no clubbing  NEURO: alert & oriented x 3 with fluent speech, no focal motor/sensory deficits  LABORATORY DATA:  I have reviewed the data as listed CBC Latest Ref Rng & Units 10/15/2017 10/03/2017 09/17/2017  WBC 3.9 - 10.3 10e3/uL 6.5 6.7 7.5  Hemoglobin 11.6 - 15.9 g/dL 12.7 12.3 12.5  Hematocrit 34.8 - 46.6 % 38.8 36.8 37.3  Platelets 145 - 400 10e3/uL 375 324 287     CMP Latest Ref Rng & Units 10/15/2017 10/03/2017 09/17/2017  Glucose 70 - 140 mg/dl 92 82 100  BUN 7.0 - 26.0 mg/dL 5.9(L) 5.5(L) 8.2  Creatinine 0.6 - 1.1 mg/dL 0.7 0.7 0.7  Sodium 136 - 145 mEq/L 142 142 142  Potassium 3.5 - 5.1 mEq/L 3.4(L) 3.5 4.0  Chloride 101 - 111 mmol/L - - -  CO2 22 - 29 mEq/L 26 27 26   Calcium 8.4 - 10.4 mg/dL 9.3 9.0 9.4  Total Protein 6.4 - 8.3 g/dL  7.2 6.9 7.1  Total Bilirubin 0.20 - 1.20 mg/dL 0.52 0.40 0.49  Alkaline Phos 40 - 150 U/L 75 74 85  AST 5 - 34 U/L 17 13 14   ALT 0-55 U/L U/L <6 <6 <6  PATHOLOGY:   Pathology Results: 04/22/17 Diagnosis Lymph node, needle/core biopsy, left retroperitoneum periaortic - POORLY DIFFERENTIATED SQUAMOUS CELL CARCINOMA - SEE COMMENT Microscopic Comment The needle core biopsy has a basaloid neoplasm with increased mitoses and necrosis. There is no residual lymph node parenchyma identified. The neoplasm is immunoreactive for cytokeratin 7 (patchy), cytokeratin 5/6, and p16 but negative for CDX2 and cytokeratin 20. Overall, the histology and immunophenotype support the diagnosis of a poorly differentiated squamous cell carcinoma.  Surgical Pathology 04/17/2017 Diagnosis Rectum, biopsy, distal - BENIGN COLONIC MUCOSA WITH EROSIONS AND ULCERATION, SEE COMMENT. - NO DYSPLASIA OR MALIGNANCY. Microscopic Comment There is lamina propria fibrosis and macrophages consistent with prior treatment. The case was called to Dr. Cristina Gong on 04/18/2017.  RADIOGRAPHIC STUDIES: I have personally reviewed the radiological images as listed and agreed with the findings in the report. No results found.   PET 10/02/17 IMPRESSION: 1. Overall stable appearing left-sided supraclavicular and subclavicular nodal mass and retroperitoneal lymphadenopathy. 2. Progressive metastatic liver disease. 3. Stable appearing presacral/perirectal mass. 4. FDG activity in the base of the right vocal cord may be due to left-sided vocal cord paralysis.   PET 08/01/17 IMPRESSION: 1. Mixed appearance of response, with significant improvement in the thoracic metastatic disease ; mild improvement in the abdominal adenopathy ; new and worsening metastatic disease in the liver; and a relatively stable left posterior perirectal mass. 2. Other imaging findings of potential clinical significance: Aortic Atherosclerosis (ICD10-I70.0).  Prominent stool throughout the colon favors constipation.   PET Scan 05/13/2017 IMPRESSION: 1. Perirectal adenopathy and posterior perirectal hypermetabolic mass, associated with presumed metastatic adenopathy in knee left common iliac, retroperitoneal, thoracic paraesophageal, and left upper mediastinal regions. The left upper mediastinal mass extends into the left supraclavicular space. 2. Metastatic disease to the liver (best seen in segment 3) along with a hypermetabolic nodule compatible with metastatic disease in the superior segment of the lower lobe of the left lung.   CT AP W Contrast 04/17/17: IMPRESSION: 1. Left posterior perirectal lobulated mass concerning for rectal malignancy. A smaller nodular density superior to this mass likely represent a mildly enlarged metastatic node. MRI is recommended for further evaluation. 2. 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 A hypodense lesion to the left of the L5 posterior to the psoas muscle is also consistent with metastatic disease. 3. Multiple hepatic hypodense lesions, incompletely characterized, possibly metastatic. MRI is recommended for further evaluation. 4. No evidence of bowel obstruction or active inflammation. Normal appendix.  ASSESSMENT & PLAN:  [name] is a 61 y.o. 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 She has had those symptoms since her recent hip surgery on 04/21/2017.   1. 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 -We reviewed her recent CT scan findings, and her retroperitoneal lymph node biopsy results in details with patient and her daughter. -Unfortunately, biopsy confirmed metastatic disease to abdominal lymph nodes. Her CT scan is also suspicious for liver and pelvic metastasis. She was not able to tolerate MRI to evaluate her liver lesions. -We discussed the incurable nature of her disease at this stage, and the goal  of therapy is palliative. -I previously discussed her staging PET scan results, which showed metastasis in liver, left lung and diffuse nodal metastasis in chest and abdomen.  -She has started first-line therapy with Nivolumab, tolerating well. -first restaging PET scan from 08/01/17 showed mixed response, her low cervical and thoracic lymph nodes have significantly decreased. However her liver metastasis have increased in size and metabolism. Her abdominal adenopathy has been stable.  -After a lengthy discussion on 08/06/2017 , we decided to continue nivolumab with close follow-up. If she shows signs and symptoms of cancer progression we will move to second line chemo sooner. She agrees. -due to her recent hospital admission, [facility] Husbands was held on 08/20/17, for 2 weeks  -We previously discussed that her recent symptoms could also be related to her cancer, and we can consider switching her treatment to chemo, pt was reluctant due to her previous poor tolerance to chemo  -We previously discussed the option of changing Nivolumab to chemo Carbo Taxol or FOLFOX. Patient previously did not tolerate chemotherapy well, is very reluctant to start chemotherapy.  -We discussed the 10/02/17 PET shows cancer progression in her liver.  -I do not see further benefit on continuing nivolumab at this point. Her treatment options are chemo Carbo Taxol weekly or single agent chemotherapy. Alternatively, due to her poor performance status, and reluctance to try chemotherapy due to her previous poor  tolerance. I think palliative care and hospice is probably more appropriate.  -After lengthy discussion, including her daughter on the phone, patient has decided to proceed with palliative care and hospice -We also spent quite a bit of time to discuss the symptom management, including poor appetite, nausea, and pain management  -Will give IV fluids today to help with her current discomfort and fatigue.  -F/u with me open, we'll refer him to hospice today    2. Nausea, anorexia and BM change  -She was recently admitted for N/V, anorexia and fever, ID work up negative, symptoms improved after su[pportive care -I previously recommended her to increase marinol to 10mg  bid -I previously suggested her to try megace for anorexia, she will think about it -I encouraged her to eat more to help her energy. She has not followed up with out dietician recently. I suggest her to f/u with them  -The overall symptoms has improved, however her calorie intake is still low -She previously hadn't had a BM for 3 days. She took MiraLax same day and if no change Lactulose was recommended.  -I recommend she take Reglan and Dissolvable Zofran 30 minutes before meal, to better help her nausea.  -Marinol does not work well enough for her. She started Mirtazapine 10/09/17. I suggest she continues with Mirtazapine for now and if this does not work I will prescribe Meggies or Odensapen.  -Prescribe Dexamethasone to use as need for the next 7 days.   3. Intermittent dyspnea -She reports intermittent dyspnea, especially when she talks and coughs, no orthopnea, no leg swelling, no chest pain or fever, unlikely congestive heart failure or infection -We did a walking test, her oxygen level remains to be 98-100% during walking. This is unlikely PE, she is on Xarelto for her history of DVT. -Possibly related to cough and or anxiety  -Continue observation  4. Depression/anxiety and insomnia  -I suggest Mirtazapine, but she wishes  to wait until after she is weaned off hydrocodone, she has been having a lot of anxiety during the weaning process of narcotics. -For the time being I previously  suggest sleep aid like melatonin over-the-counter  - I previously referred her to social worker  5. Left hip pain after hip replacement  -She underwent a total left hip replacement by Dr. Alvan Dame on 03/25/2017 -She has weaned off hydrocodone, on Tylenol No. 3 twice daily previously  -she will continue physical therapy as outpatient, she is overall improving. -I previously suggested the pt reduce Tylenol #3 by half tablets to replace with tramadol, starting with the morning dose.  -She uses tylenol #3 as needed and not much of tramadol since hospitalization.  -I discussed with her many cancer symptoms she is fine to remain on tylenol #3 as needed only. She can still use tramadol more if she would like.   6. Goal of care discussion  -We again discussed the incurable nature of her cancer, and the overall poor prognosis, especially if she does not have good response to chemotherapy or progress on chemo -The patient understands the goal of care is palliative. -I recommend DNR/DNI, she agrees now   PLAN:  -[place] referral, I will remain to be her MD  -IV fluid with antinausea medication and dexamethasone today  -Prescribe dexamethasone 4mg  to take daily for 5 days.  -continue Marinol and mirtazapine -F/u open, on as needed base    No orders of the defined types were placed in this encounter.  All questions were answered. The patient knows to call the clinic with any problems, questions or concerns. No barriers to learning was detected.  I spent 35 minutes counseling the patient face to face. The total time spent in the appointment was 40 minutes and more than 50% was on counseling and review of test results  This document serves as a record of services personally performed by Truitt Merle, MD. It was created on her behalf by Joslyn Devon, a trained medical scribe. The creation of this record is based on the scribe's personal observations and the provider's statements to them. This document has been checked and approved by the attending provider.     Truitt Merle  10/15/2017

## 2017-10-15 NOTE — Patient Instructions (Signed)
Dehydration, Adult Dehydration is when there is not enough fluid or water in your body. This happens when you lose more fluids than you take in. Dehydration can range from mild to very bad. It should be treated right away to keep it from getting very bad. Symptoms of mild dehydration may include:  Thirst.  Dry lips.  Slightly dry mouth.  Dry, warm skin.  Dizziness. Symptoms of moderate dehydration may include:  Very dry mouth.  Muscle cramps.  Dark pee (urine). Pee may be the color of tea.  Your body making less pee.  Your eyes making fewer tears.  Heartbeat that is uneven or faster than normal (palpitations).  Headache.  Light-headedness, especially when you stand up from sitting.  Fainting (syncope). Symptoms of very bad dehydration may include:  Changes in skin, such as: ? Cold and clammy skin. ? Blotchy (mottled) or pale skin. ? Skin that does not quickly return to normal after being lightly pinched and let go (poor skin turgor).  Changes in body fluids, such as: ? Feeling very thirsty. ? Your eyes making fewer tears. ? Not sweating when body temperature is high, such as in hot weather. ? Your body making very little pee.  Changes in vital signs, such as: ? Weak pulse. ? Pulse that is more than 100 beats a minute when you are sitting still. ? Fast breathing. ? Low blood pressure.  Other changes, such as: ? Sunken eyes. ? Cold hands and feet. ? Confusion. ? Lack of energy (lethargy). ? Trouble waking up from sleep. ? Short-term weight loss. ? Unconsciousness. Follow these instructions at home:  If told by your doctor, drink an ORS: ? Make an ORS by using instructions on the package. ? Start by drinking small amounts, about  cup (120 mL) every 5-10 minutes. ? Slowly drink more until you have had the amount that your doctor said to have.  Drink enough clear fluid to keep your pee clear or pale yellow. If you were told to drink an ORS, finish the ORS  first, then start slowly drinking clear fluids. Drink fluids such as: ? Water. Do not drink only water by itself. Doing that can make the salt (sodium) level in your body get too low (hyponatremia). ? Ice chips. ? Fruit juice that you have added water to (diluted). ? Low-calorie sports drinks.  Avoid: ? Alcohol. ? Drinks that have a lot of sugar. These include high-calorie sports drinks, fruit juice that does not have water added, and soda. ? Caffeine. ? Foods that are greasy or have a lot of fat or sugar.  Take over-the-counter and prescription medicines only as told by your doctor.  Do not take salt tablets. Doing that can make the salt level in your body get too high (hypernatremia).  Eat foods that have minerals (electrolytes). Examples include bananas, oranges, potatoes, tomatoes, and spinach.  Keep all follow-up visits as told by your doctor. This is important. Contact a doctor if:  You have belly (abdominal) pain that: ? Gets worse. ? Stays in one area (localizes).  You have a rash.  You have a stiff neck.  You get angry or annoyed more easily than normal (irritability).  You are more sleepy than normal.  You have a harder time waking up than normal.  You feel: ? Weak. ? Dizzy. ? Very thirsty.  You have peed (urinated) only a small amount of very dark pee during 6-8 hours. Get help right away if:  You have symptoms of   very bad dehydration.  You cannot drink fluids without throwing up (vomiting).  Your symptoms get worse with treatment.  You have a fever.  You have a very bad headache.  You are throwing up or having watery poop (diarrhea) and it: ? Gets worse. ? Does not go away.  You have blood or something green (bile) in your throw-up.  You have blood in your poop (stool). This may cause poop to look black and tarry.  You have not peed in 6-8 hours.  You pass out (faint).  Your heart rate when you are sitting still is more than 100 beats a  minute.  You have trouble breathing. This information is not intended to replace advice given to you by your health care provider. Make sure you discuss any questions you have with your health care provider. Document Released: 10/05/2009 Document Revised: 06/28/2016 Document Reviewed: 02/02/2016 [employer] Interactive Patient Education  2018 [employer]  

## 2017-10-15 NOTE — Telephone Encounter (Signed)
Referral called to [facility] for hospice care.

## 2017-10-15 NOTE — Telephone Encounter (Signed)
Opened in error

## 2017-10-16 ENCOUNTER — Encounter: Payer: Self-pay | Admitting: General Practice

## 2017-10-16 NOTE — Progress Notes (Signed)
[place] Spiritual Care Note  Followed up with Thomasena by phone because I was unable to see her during her appt with Dr Burr Medico yesterday.  Taniah used the opportunity to process her decision to go with hospice rather than further chemo, as well as her current goals and hopes.  She plans to utilize chaplain support via hospice and also regards me as her local clergy, because she does not know other [employer] in [place].  She verbalized deep desire to attend [employer] for worship (most likely [place], because its style is most familiar to her).  She also wants to speak frankly and theologically about her hopes for a good death and what (emotionally, etc) might help her have a gentle, positive passing.  We plan to f/u by phone next week, and she may choose to make an in-person appt as well, provided energy allows.   [place], [place], [facility] Pager [phone] Voicemail [phone]

## 2017-10-17 ENCOUNTER — Encounter: Payer: Self-pay | Admitting: Hematology

## 2017-10-24 ENCOUNTER — Other Ambulatory Visit: Payer: Self-pay | Admitting: Oncology

## 2017-10-24 DIAGNOSIS — F32A Depression, unspecified: Secondary | ICD-10-CM

## 2017-10-24 DIAGNOSIS — F320 Major depressive disorder, single episode, mild: Secondary | ICD-10-CM

## 2017-12-02 ENCOUNTER — Other Ambulatory Visit: Payer: Self-pay | Admitting: Hematology

## 2017-12-02 ENCOUNTER — Telehealth: Payer: Self-pay

## 2017-12-02 MED ORDER — SCOPOLAMINE 1 MG/3DAYS TD PT72: 1.0000 | MEDICATED_PATCH | TRANSDERMAL | 2 refills | Status: AC

## 2017-12-02 NOTE — Telephone Encounter (Signed)
Called [name] with information

## 2017-12-02 NOTE — Telephone Encounter (Signed)
I have called in scopolamine patch for her, please let her know, thanks   Truitt Merle MD

## 2017-12-02 NOTE — Telephone Encounter (Signed)
Pt is having dry heaves for weeks. She has not eaten in several weeks. She is drinking water.  She cannot take medication orally. Ondansetron ODT is also triggering dry heaves.  Daughter is asking about anti-nausea patches. On line was sancuso (granisetron) and was also scopolamine. She does have a coupon for sancuso.

## 2017-12-02 NOTE — Telephone Encounter (Signed)
[name] called stating she talked with hospice and they cannot write an RX for antinausea patch. The rx will need to come from Dr Burr Medico.

## 2017-12-03 ENCOUNTER — Telehealth: Payer: Self-pay

## 2017-12-03 ENCOUNTER — Telehealth: Payer: Self-pay | Admitting: *Deleted

## 2017-12-03 MED FILL — SANCUSO 3.1 MG/24 HR PATCH: 3.1 | 7 days supply | Qty: 1 | Fill #0

## 2017-12-03 NOTE — Telephone Encounter (Signed)
Daughter [name] called again requesting Fentanyl patch for pt .  Stated pt was taking Tylenol # 4 po with pain relief.  However, pt can not take po meds now due to gagging and nausea with anything by mouth.  Stated Hospice is not able to write scripts  for Sancuso and Fentanyl patches.

## 2017-12-03 NOTE — Telephone Encounter (Signed)
Received call from daughter [name] requesting Sancuso patch for pt.  Stated pt had Scopolamine patch applied since 430 pm yesterday, and pt is still feeling nauseated.  Stated Scopolamine patch not helping at all.  [name] requested Sancuso patch - since pt can not take anything po. [facility]    Phone     [phone].

## 2017-12-03 NOTE — Telephone Encounter (Signed)
Please let her hospice nurse call me, so I can coordinate her pain management. Thanks   Truitt Merle MD

## 2017-12-03 NOTE — Telephone Encounter (Signed)
[name] called again. She is aware that sancuso is not on hospice formulary. She is willing to pay for it. She does have a coupon.  [name] is wondering if she is going through withdrawals from TC#3 b/c she is not able to take PO. It has been about 2 weeks since she has eaten.

## 2017-12-03 NOTE — Telephone Encounter (Signed)
OK with me, please call in for her, thanks   Truitt Merle MD

## 2017-12-03 NOTE — Telephone Encounter (Signed)
Daughter called stating that scopalamine patch did not help with the nausea and dry heaves. She would like to try the sancuso (granisetron) patch if Dr Burr Medico will order it.

## 2017-12-08 ENCOUNTER — Telehealth: Payer: Self-pay

## 2017-12-08 MED ORDER — GRANISETRON 3.1 MG/24HR TD PTCH: MEDICATED_PATCH | TRANSDERMAL | 1 refills | Status: DC

## 2017-12-08 MED FILL — SANCUSO 3.1 MG/24 HR PATCH: 3.1 | 14 days supply | Qty: 2 | Fill #0

## 2017-12-08 NOTE — Telephone Encounter (Signed)
[name] called for refill on sancuso patch. She states mother has improved from about 10 dry heaves per day to 2 times per day. She only got 1 patch and no refills. She asked for 2 patches and 1 refill. At [facility] outpatient pharmacy. Verified with pharmacy the rx was sent by Dr Burr Medico. Called in refill and placed on [facility]

## 2017-12-19 MED FILL — SANCUSO 3.1 MG/24 HR PATCH: 3.1 | 14 days supply | Qty: 2 | Fill #1

## 2017-12-26 ENCOUNTER — Telehealth: Payer: Self-pay | Admitting: *Deleted

## 2017-12-26 MED ORDER — GRANISETRON 3.1 MG/24HR TD PTCH: MEDICATED_PATCH | TRANSDERMAL | 0 refills | Status: AC

## 2017-12-26 NOTE — Telephone Encounter (Signed)
Received call from daughter, [name] stating that pt is down to 1 patch of the sancuso & she is responding but the effectiveness wears off after 4-5 days.  She wants to know if insurance would cover to change patch every 5 days.  Discussed with Dr Burr Medico & she agrees to try.  Talked with pharmacist/Stephanie at [facility] & she states that insurance will cover 6 patches every 67 days.  She is due a refill tomorrow but the pharmacy is closed therefore she will send in refill on Monday for 2 more patches.  Informed [name].  After that she may need to have prior auth for further refills. [name] expressed understanding.

## 2017-12-29 ENCOUNTER — Telehealth: Payer: Self-pay | Admitting: *Deleted

## 2017-12-29 MED FILL — SANCUSO 3.1 MG/24 HR PATCH: 3.1 | 5 days supply | Qty: 1 | Fill #0

## 2017-12-29 NOTE — Telephone Encounter (Signed)
Received call from [place] re:  Due to insurance issue, pt is allowed only 1 more Sancuso patch - with  $ 20.00 copay.   Pt cannot have any more free patch until  March 5.    Spoke with daughter [name], and informed her of pharmacy's instructions.  [name] voiced understanding, and wished to go ahead with this copay patch for pt. [place] notified.

## 2018-01-05 MED FILL — SANCUSO 3.1 MG/24 HR PATCH: 3.1 | 10 days supply | Qty: 2 | Fill #1

## 2018-01-16 ENCOUNTER — Other Ambulatory Visit: Payer: Self-pay | Admitting: Nurse Practitioner

## 2018-01-23 DEATH — deceased

## 2018-10-18 IMAGING — DX DG HIP (WITH OR WITHOUT PELVIS) 1V PORT*L*
2 series · 2 of 2 positions shown · non-contrast
Comparison: None.

CLINICAL DATA: Status post left total hip replacement.

EXAM:
DG HIP (WITH OR WITHOUT PELVIS) 1V PORT LEFT

[pelvis ap]
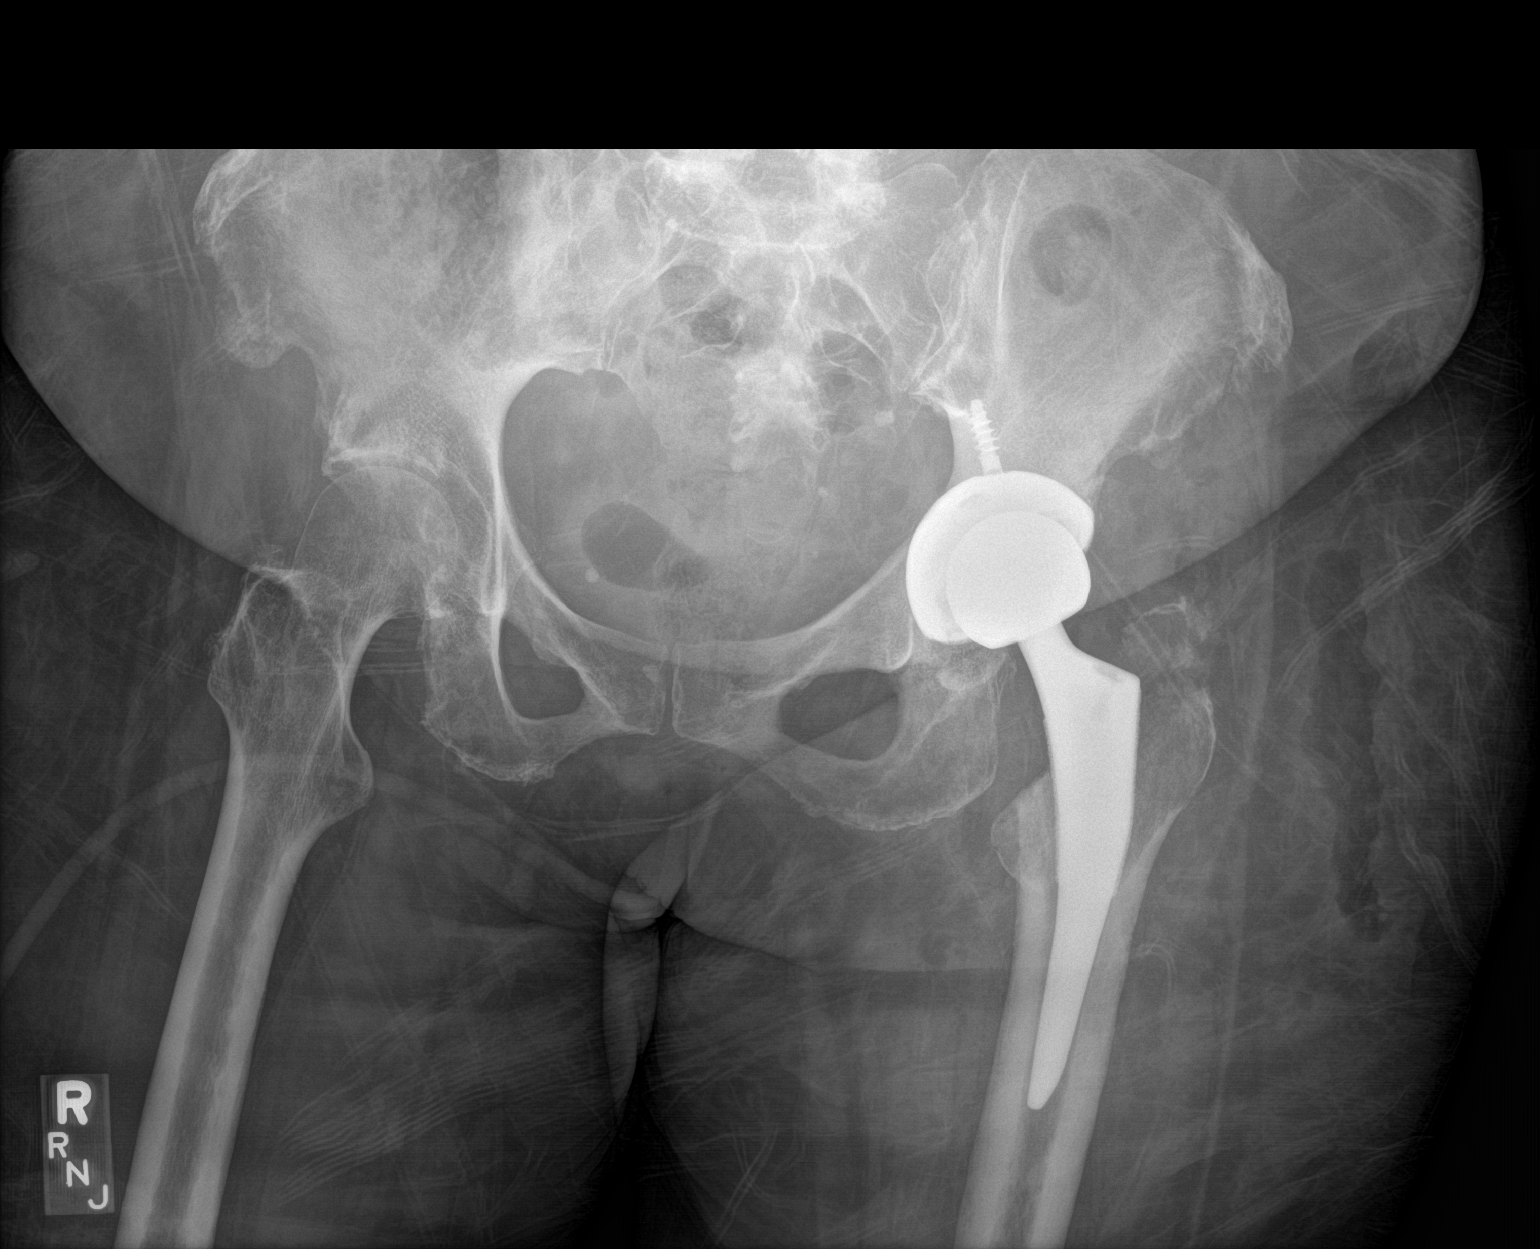

[hip lat]
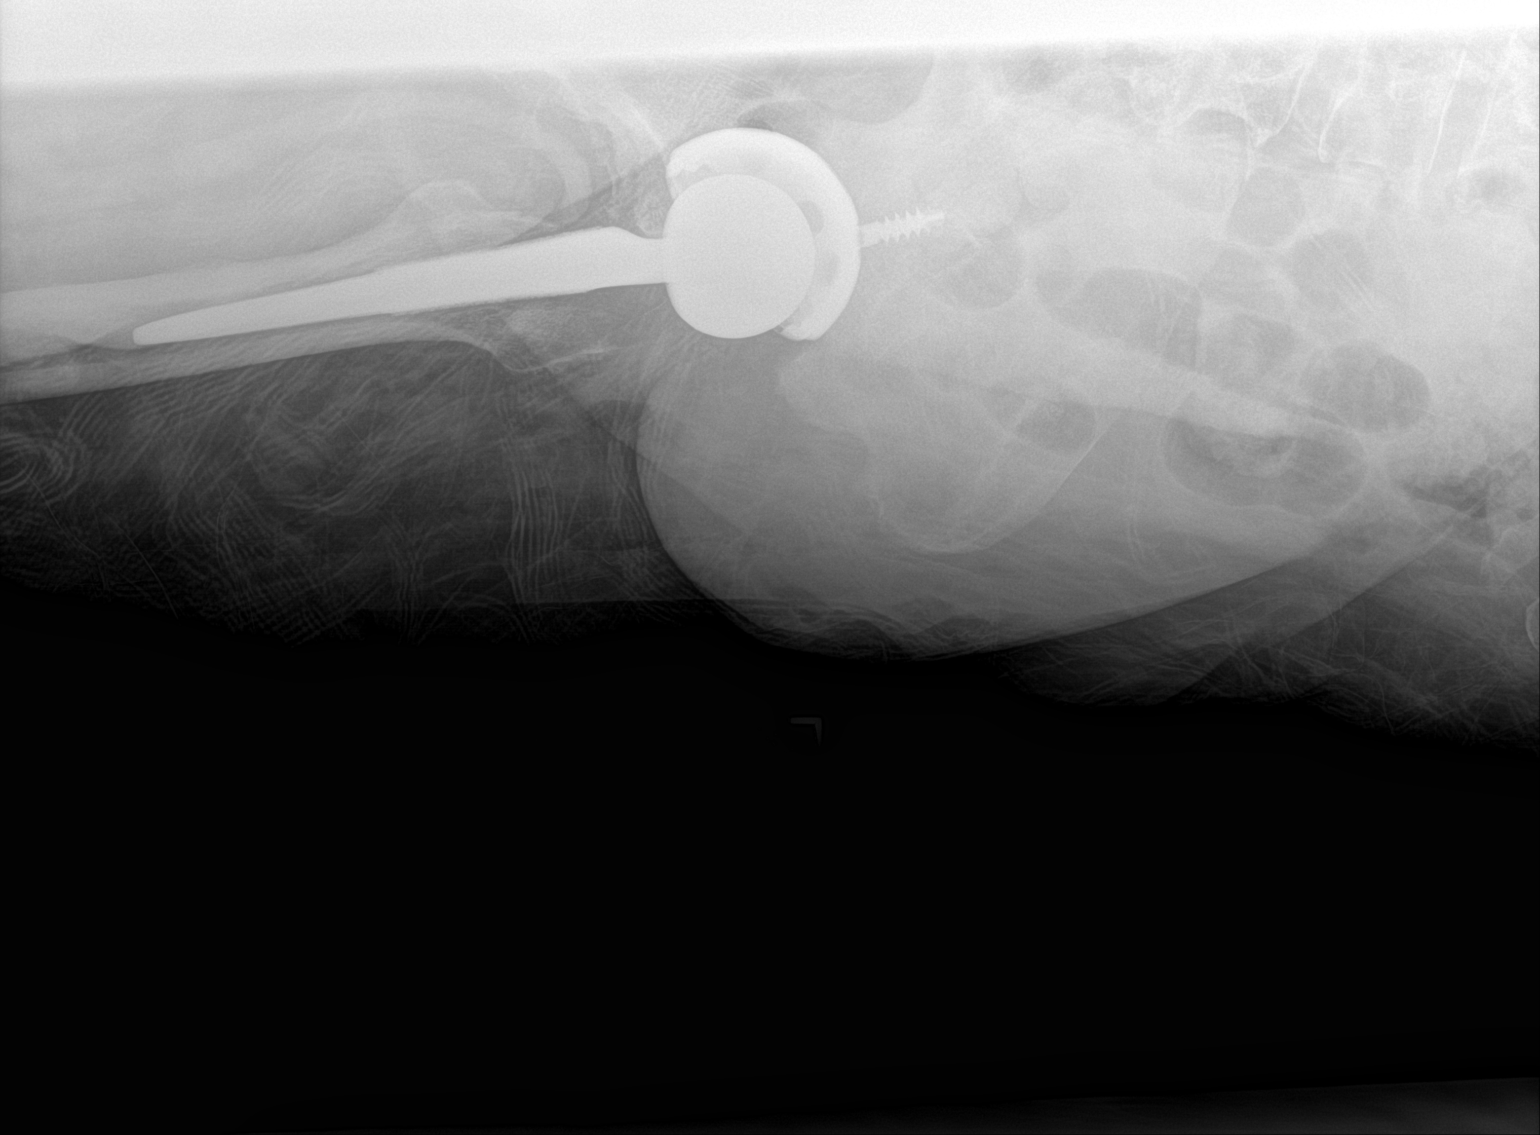

[2 of 2 positions shown; findings below may reference images not displayed]

FINDINGS: Left total hip prosthesis in satisfactory position and alignment.
There is fragmentation of the superior aspect of the greater
trochanter. Diffuse osteopenia.
IMPRESSION: Satisfactory appearance of a left total hip prosthesis with
fragmentation of the superior aspect of the left greater trochanter.

## 2018-11-10 IMAGING — CT CT ABD-PELV W/ CM
2 of 5 series · 14 of 46 positions shown, 16 images · IV contrast (ISOVUE)
Comparison: None.

CLINICAL DATA: 60-year-old female with nausea and vomiting.

EXAM:
CT ABDOMEN AND PELVIS WITH CONTRAST
TECHNIQUE: Multidetector CT imaging of the abdomen and pelvis was performed
using the standard protocol following bolus administration of
intravenous contrast.
CONTRAST:  100 cc 1sovue-SCC

[Series 2: abd/pel with · axial · 0.74mm/px · z∈[-809,-429]mm · 11 of 88 slices shown, 13 images]
[im 6/88  soft-tissue]
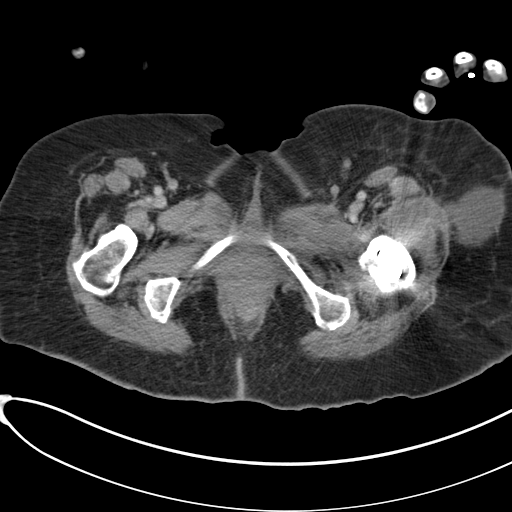
[im 6/88  bone]
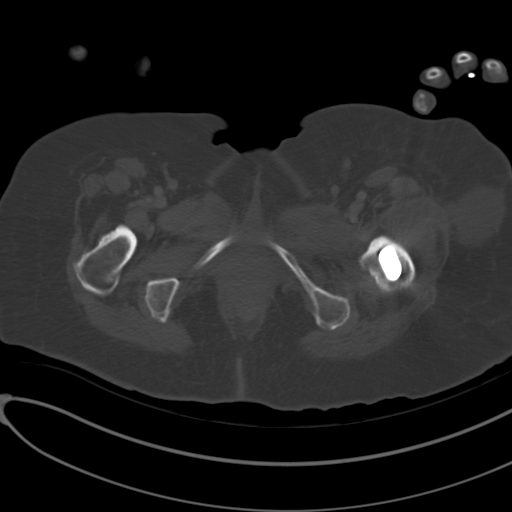
[im 16/88  soft-tissue]
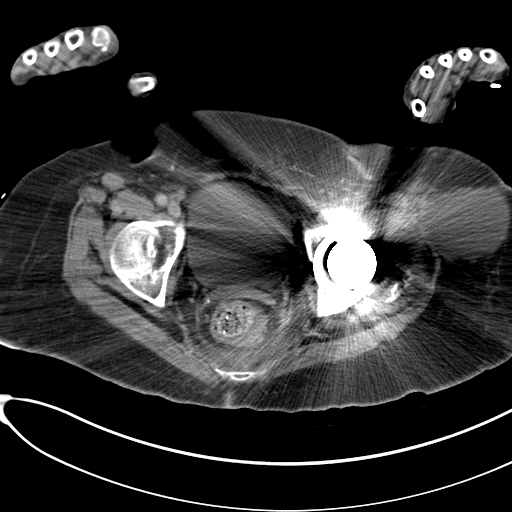
[im 21/88  soft-tissue]
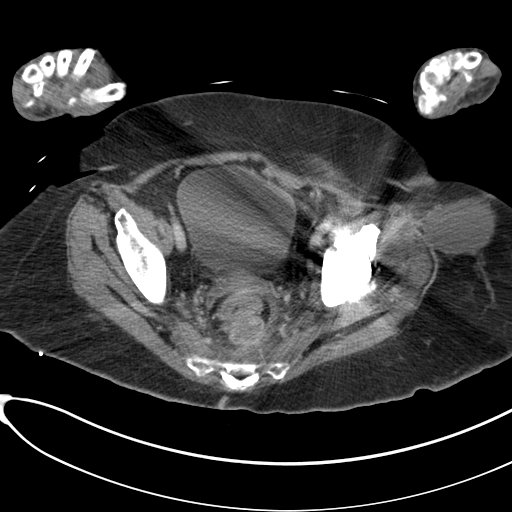
[im 31/88  soft-tissue]
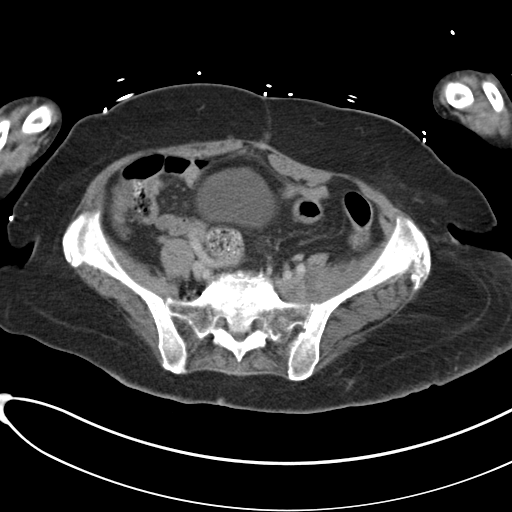
[im 36/88  soft-tissue]
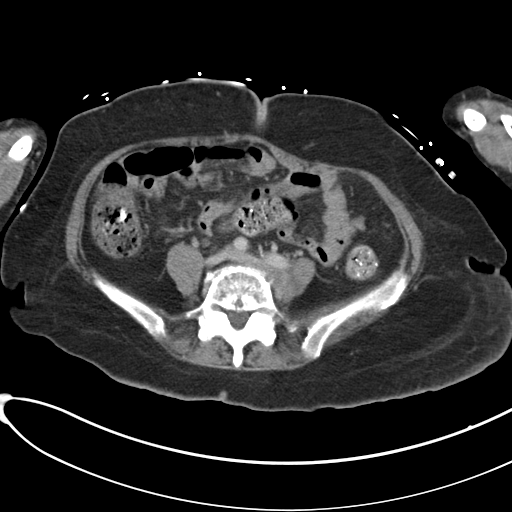
[im 47/88  soft-tissue]
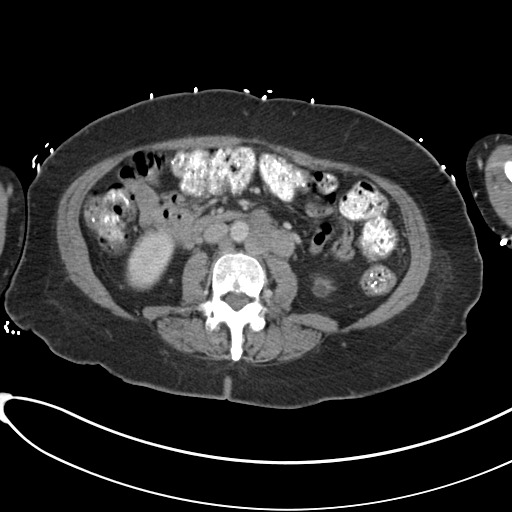
[im 52/88  soft-tissue]
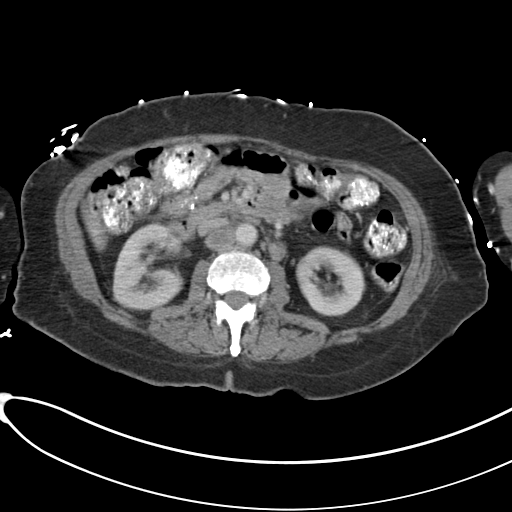
[im 57/88  soft-tissue]
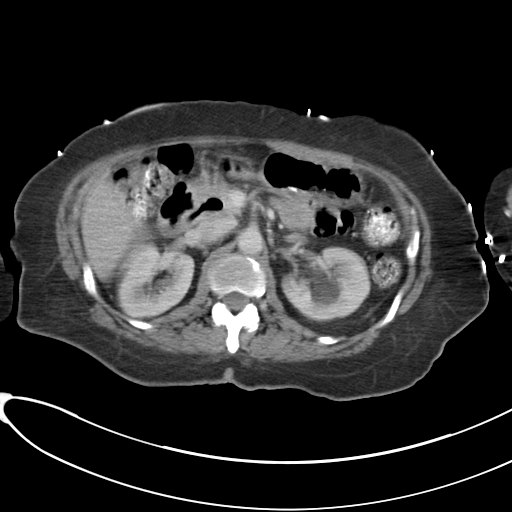
[im 67/88  soft-tissue]
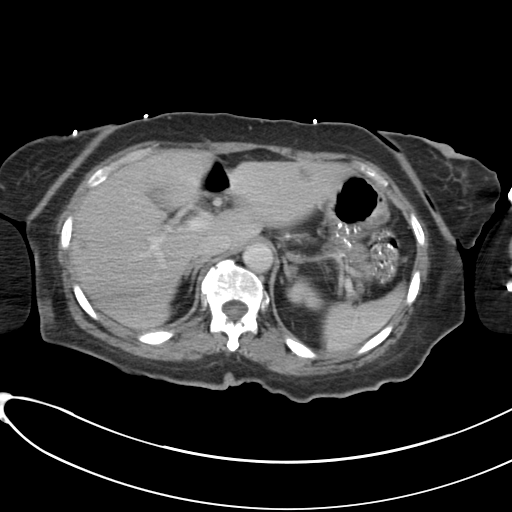
[im 67/88  bone]
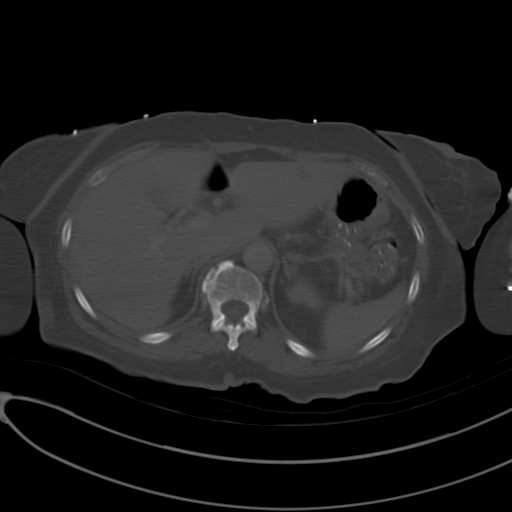
[im 72/88  soft-tissue]
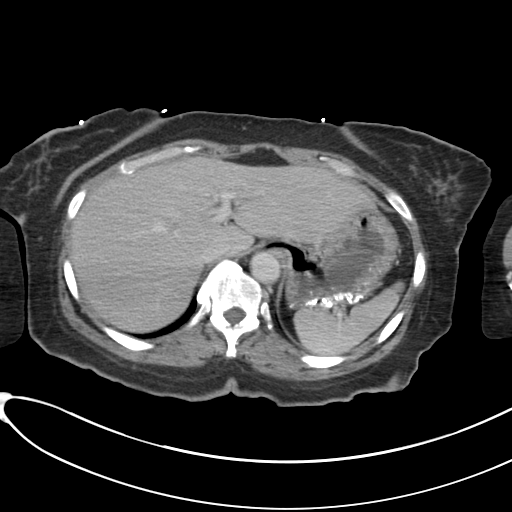
[im 82/88  soft-tissue]
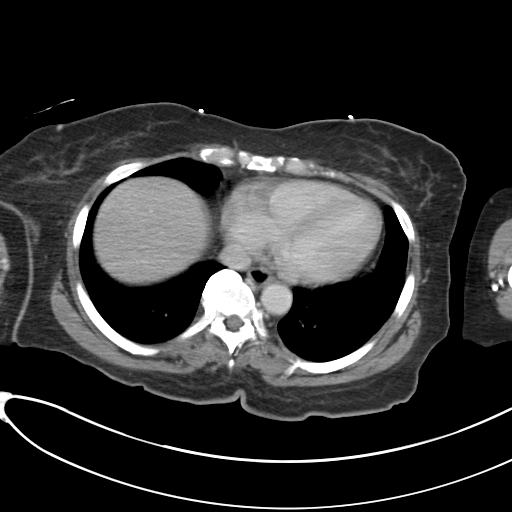

[Series 4: coronal a/|p · coronal · 0.63mm/px · 3 of 111 slices shown]
[im 37/111  soft-tissue]
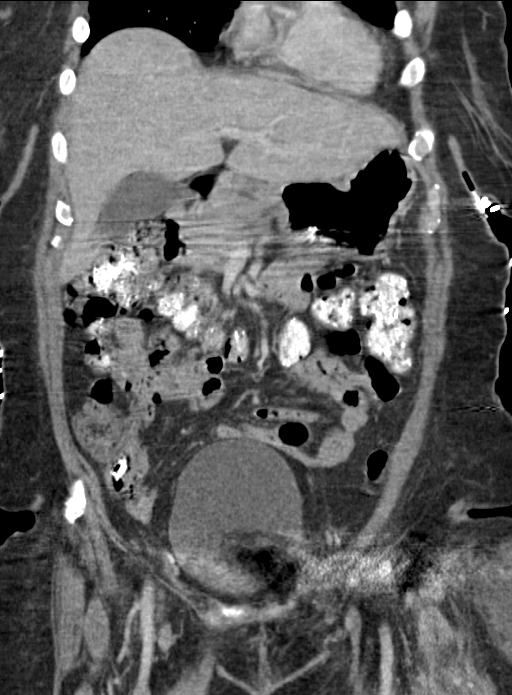
[im 49/111  soft-tissue]
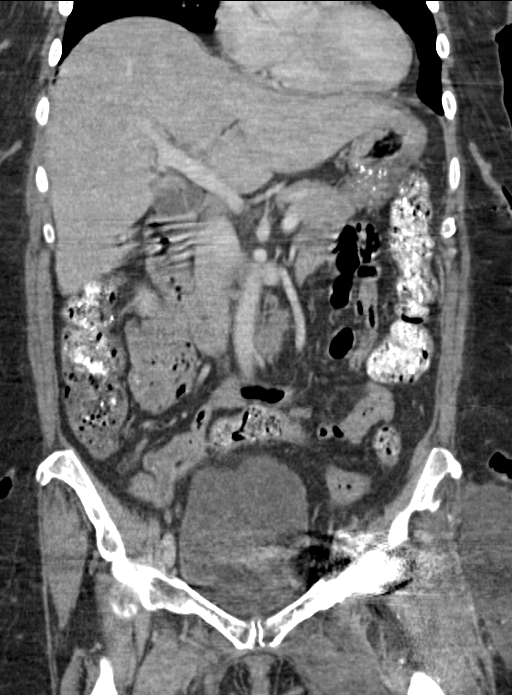
[im 62/111  soft-tissue]
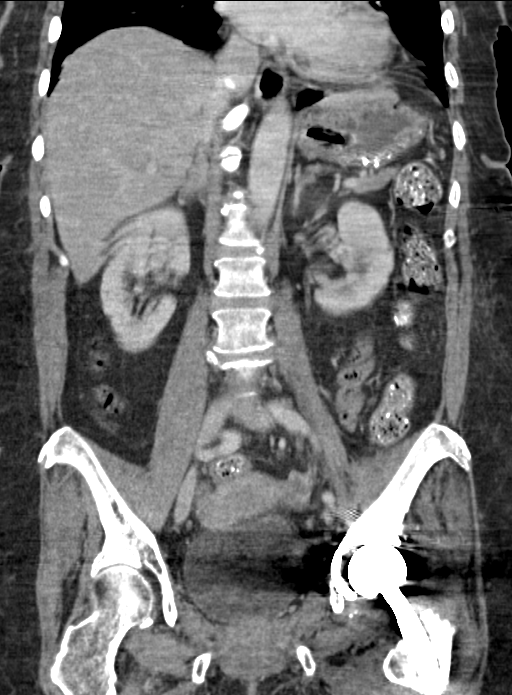

[14 of 46 positions shown; findings below may reference images not displayed]

FINDINGS: Lower chest: The visualized lung bases are clear.

No intra-abdominal free air or free fluid.

Hepatobiliary: A subcentimeter hypodense lesion in the right lobe of
the liver as well as a 13 mm low attenuating lesion in the left lobe
of the liver are not well characterized on this CT. MRI may provide
better characterisation. There is no intrahepatic biliary ductal
dilatation. The gallbladder is unremarkable.

Pancreas: Unremarkable. No pancreatic ductal dilatation or
surrounding inflammatory changes.

Spleen: Normal in size without focal abnormality.

Adrenals/Urinary Tract: The adrenal glands are unremarkable. There
is a 3 cm on left renal parapelvic cyst. A small exophytic cyst is
noted from the inferior pole of the left kidney. Multiple small
parenchymal calcific foci noted in the upper pole of the left
kidney. There is no hydronephrosis on either side. There is
symmetric uptake and excretion of contrast by kidneys. The
visualized ureters and urinary bladder appear unremarkable.

Stomach/Bowel: There is a 2.1 x 2.5 x 3.2 cm somewhat lobulated left
posterior rectal mass. There is loss of fat plane between this
lesion and the rectal wall. This may represent a neoplasm arising
from the rectum or an abnormal/ neoplastic lymph node. An 11 x 13 mm
left perirectal rounded lesion more superiorly is also noted which
may represent extension of the larger mass or a mildly enlarged
lymph node. Pelvic MRI is recommended for further characterization.
Oral contrast mixed with stool noted throughout the colon. There is
no evidence of bowel obstruction or active inflammation. Normal
appendix. There is probable duodenal diverticulum at the head of the
pancreas.

Vascular/Lymphatic: There is minimal aortoiliac atherosclerotic
disease. The abdominal aorta and IVC are otherwise unremarkable. No
portal venous gas identified. Multiple retroperitoneal adenopathy
noted. A cluster of adjacent left para-aortic lymph node measure
x 3.0 x 5.5 cm.

Reproductive: The uterus and ovaries are grossly unremarkable as
visualized.

Other: There is a 3.7 x 1.2 x 1.7 cm low attenuating lesion to the
left of the in L5 vertebra posterior to the psoas muscle consistent
with metastatic disease.

Musculoskeletal: Multilevel degenerative changes of the spine with
osteophyte formation. The bones are osteopenic. There is
degenerative changes with disc space narrowing at L5-S1. No acute
fracture. There is a total left hip arthroplasty.
IMPRESSION: 1. Left posterior perirectal lobulated mass concerning for rectal
malignancy. A smaller nodular density superior to this mass likely
represent a mildly enlarged metastatic node. MRI is recommended for
further evaluation.
2. Retroperitoneal adenopathy most consistent with metastatic
disease, likely from rectal neoplasm. A hypodense lesion to the left
of the L5 posterior to the psoas muscle is also consistent with
metastatic disease.
3. Multiple hepatic hypodense lesions, incompletely characterized,
possibly metastatic. MRI is recommended for further evaluation.
4. No evidence of bowel obstruction or active inflammation. Normal
appendix.

## 2018-12-29 NOTE — Telephone Encounter (Signed)
Error opening
